# Patient Record
Sex: Female | Born: 1975
Health system: Southern US, Community
[De-identification: ages and names within clinical notes are randomized; demographics above are authoritative.]

## PROBLEM LIST (undated history)

## (undated) DIAGNOSIS — O139 Gestational [pregnancy-induced] hypertension without significant proteinuria, unspecified trimester: Secondary | ICD-10-CM

## (undated) DIAGNOSIS — L219 Seborrheic dermatitis, unspecified: Secondary | ICD-10-CM

## (undated) DIAGNOSIS — D649 Anemia, unspecified: Secondary | ICD-10-CM

---

## 1999-07-29 ENCOUNTER — Emergency Department (HOSPITAL_COMMUNITY): Admission: EM | Admit: 1999-07-29 | Discharge: 1999-07-29 | Payer: Self-pay | Admitting: Emergency Medicine

## 1999-11-13 ENCOUNTER — Other Ambulatory Visit: Admission: RE | Admit: 1999-11-13 | Discharge: 1999-11-13 | Payer: Self-pay | Admitting: Family Medicine

## 2000-03-20 ENCOUNTER — Encounter: Payer: Self-pay | Admitting: Emergency Medicine

## 2000-03-20 ENCOUNTER — Emergency Department (HOSPITAL_COMMUNITY): Admission: EM | Admit: 2000-03-20 | Discharge: 2000-03-20 | Payer: Self-pay | Admitting: Emergency Medicine

## 2001-04-19 ENCOUNTER — Other Ambulatory Visit: Admission: RE | Admit: 2001-04-19 | Discharge: 2001-04-19 | Payer: Self-pay | Admitting: Family Medicine

## 2002-01-26 ENCOUNTER — Inpatient Hospital Stay (HOSPITAL_COMMUNITY): Admission: AD | Admit: 2002-01-26 | Discharge: 2002-01-26 | Payer: Self-pay | Admitting: Obstetrics and Gynecology

## 2002-04-09 ENCOUNTER — Inpatient Hospital Stay (HOSPITAL_COMMUNITY): Admission: AD | Admit: 2002-04-09 | Discharge: 2002-04-09 | Payer: Self-pay | Admitting: Obstetrics and Gynecology

## 2003-01-30 ENCOUNTER — Emergency Department (HOSPITAL_COMMUNITY): Admission: EM | Admit: 2003-01-30 | Discharge: 2003-01-30 | Payer: Self-pay | Admitting: Emergency Medicine

## 2004-07-18 ENCOUNTER — Emergency Department (HOSPITAL_COMMUNITY): Admission: EM | Admit: 2004-07-18 | Discharge: 2004-07-18 | Payer: Self-pay | Admitting: Emergency Medicine

## 2004-10-28 ENCOUNTER — Emergency Department (HOSPITAL_COMMUNITY): Admission: EM | Admit: 2004-10-28 | Discharge: 2004-10-28 | Payer: Self-pay | Admitting: Emergency Medicine

## 2004-11-05 ENCOUNTER — Other Ambulatory Visit: Admission: RE | Admit: 2004-11-05 | Discharge: 2004-11-05 | Payer: Self-pay | Admitting: Family Medicine

## 2005-02-13 ENCOUNTER — Emergency Department (HOSPITAL_COMMUNITY): Admission: EM | Admit: 2005-02-13 | Discharge: 2005-02-14 | Payer: Self-pay | Admitting: Emergency Medicine

## 2005-03-20 ENCOUNTER — Emergency Department (HOSPITAL_COMMUNITY): Admission: EM | Admit: 2005-03-20 | Discharge: 2005-03-20 | Payer: Self-pay | Admitting: Emergency Medicine

## 2006-01-04 ENCOUNTER — Emergency Department (HOSPITAL_COMMUNITY): Admission: EM | Admit: 2006-01-04 | Discharge: 2006-01-04 | Payer: Self-pay | Admitting: Emergency Medicine

## 2007-01-12 ENCOUNTER — Other Ambulatory Visit: Admission: RE | Admit: 2007-01-12 | Discharge: 2007-01-12 | Payer: Self-pay | Admitting: Family Medicine

## 2007-01-14 ENCOUNTER — Emergency Department (HOSPITAL_COMMUNITY): Admission: EM | Admit: 2007-01-14 | Discharge: 2007-01-14 | Payer: Self-pay | Admitting: Family Medicine

## 2009-01-21 ENCOUNTER — Emergency Department (HOSPITAL_COMMUNITY): Admission: EM | Admit: 2009-01-21 | Discharge: 2009-01-21 | Payer: Self-pay | Admitting: Emergency Medicine

## 2009-02-18 ENCOUNTER — Other Ambulatory Visit: Admission: RE | Admit: 2009-02-18 | Discharge: 2009-02-18 | Payer: Self-pay | Admitting: Family Medicine

## 2011-10-29 ENCOUNTER — Emergency Department (HOSPITAL_BASED_OUTPATIENT_CLINIC_OR_DEPARTMENT_OTHER)
Admission: EM | Admit: 2011-10-29 | Discharge: 2011-10-29 | Disposition: A | Discharge status: Home / Self Care | Payer: Managed Care, Other (non HMO) | Attending: Emergency Medicine | Admitting: Emergency Medicine

## 2011-10-29 DIAGNOSIS — R300 Dysuria: Secondary | ICD-10-CM | POA: Insufficient documentation

## 2011-10-29 DIAGNOSIS — N390 Urinary tract infection, site not specified: Secondary | ICD-10-CM | POA: Insufficient documentation

## 2011-10-29 DIAGNOSIS — R350 Frequency of micturition: Secondary | ICD-10-CM | POA: Insufficient documentation

## 2011-10-29 DIAGNOSIS — F17200 Nicotine dependence, unspecified, uncomplicated: Secondary | ICD-10-CM | POA: Insufficient documentation

## 2011-10-29 LAB — URINE MICROSCOPIC-ADD ON

## 2011-10-29 LAB — URINALYSIS, ROUTINE W REFLEX MICROSCOPIC
Glucose, UA: NEGATIVE mg/dL
Protein, ur: 30 mg/dL — AB
Specific Gravity, Urine: 1.017 (ref 1.005–1.030)
pH: 6 (ref 5.0–8.0)

## 2011-10-29 LAB — PREGNANCY, URINE: Preg Test, Ur: NEGATIVE

## 2011-10-29 MED ORDER — PHENAZOPYRIDINE HCL 200 MG PO TABS: 200.0000 mg | ORAL_TABLET | Freq: Three times a day (TID) | ORAL | Status: AC

## 2011-10-29 MED ORDER — NITROFURANTOIN MONOHYD MACRO 100 MG PO CAPS: 100.0000 mg | ORAL_CAPSULE | Freq: Two times a day (BID) | ORAL | Status: AC

## 2011-10-29 MED ORDER — NITROFURANTOIN MONOHYD MACRO 100 MG PO CAPS
100.0000 mg | ORAL_CAPSULE | Freq: Once | ORAL | Status: AC
  Administered 2011-10-29: 100 mg via ORAL
  Filled 2011-10-29: qty 1

## 2011-10-29 NOTE — ED Provider Notes (Signed)
History     CSN: [id number]  Arrival date & time 10/29/11  1001   First MD Initiated Contact with Patient 10/29/11 1017      Chief Complaint  Patient presents with  . Urinary Frequency    (Consider location/radiation/quality/duration/timing/severity/associated sxs/prior treatment) HPI Comments: Complains of dysuria at the end of her urinary stream, urinary frequency, urinary urgency. States she's unable to sleep last night secondary to her urinary frequency. Denies fever, abdominal pain, nausea, vomiting.  Denies vaginal sx  Patient is a 35 y.o. female presenting with frequency. The history is provided by the patient. No language interpreter was used.  Urinary Frequency This is a new problem. The current episode started more than 2 days ago (1 week ago). The problem occurs constantly. The problem has been gradually worsening. Pertinent negatives include no chest pain, no abdominal pain, no headaches and no shortness of breath. The symptoms are aggravated by nothing. The symptoms are relieved by nothing. She has tried nothing for the symptoms.    History reviewed. No pertinent past medical history.  History reviewed. No pertinent past surgical history.  No family history on file.  History  Substance Use Topics  . Smoking status: Current Everyday Smoker -- 0.5 packs/day  . Smokeless tobacco: Never Used  . Alcohol Use: Yes     occasional    OB History    Grav Para Term Preterm Abortions TAB SAB Ect Mult Living                  Review of Systems  Constitutional: Negative for fever, activity change, appetite change and fatigue.  HENT: Negative for congestion, sore throat, rhinorrhea, neck pain and neck stiffness.   Respiratory: Negative for cough and shortness of breath.   Cardiovascular: Negative for chest pain and palpitations.  Gastrointestinal: Negative for nausea, vomiting and abdominal pain.  Genitourinary: Positive for dysuria, urgency and frequency. Negative for  hematuria, flank pain, vaginal bleeding and vaginal discharge.  Musculoskeletal: Negative for myalgias, back pain and arthralgias.  Neurological: Negative for dizziness, weakness, light-headedness, numbness and headaches.  All other systems reviewed and are negative.    Allergies  Review of patient's allergies indicates no known allergies.  Home Medications   Current Outpatient Rx  Name Route Sig Dispense Refill  . NITROFURANTOIN MONOHYD MACRO 100 MG PO CAPS Oral Take 1 capsule (100 mg total) by mouth 2 (two) times daily. 10 capsule 0  . PHENAZOPYRIDINE HCL 200 MG PO TABS Oral Take 1 tablet (200 mg total) by mouth 3 (three) times daily. 6 tablet 0    BP 118/79  Pulse 84  Temp(Src) 98.2 F (36.8 C) (Oral)  Resp 16  Ht 5\' 3"  (1.6 m)  Wt 180 lb (81.647 kg)  BMI 31.89 kg/m2  SpO2 100%  LMP 10/08/2011  Physical Exam  Nursing note and vitals reviewed. Constitutional: She is oriented to person, place, and time. She appears well-developed and well-nourished. No distress.  HENT:  Head: Normocephalic and atraumatic.  Mouth/Throat: Oropharynx is clear and moist.  Eyes: Conjunctivae and EOM are normal. Pupils are equal, round, and reactive to light.  Neck: Normal range of motion. Neck supple.  Cardiovascular: Normal rate, regular rhythm and intact distal pulses.  Exam reveals no gallop and no friction rub.   No murmur heard. Pulmonary/Chest: Effort normal and breath sounds normal. No respiratory distress.  Abdominal: Soft. Bowel sounds are normal. There is no tenderness.  Musculoskeletal: Normal range of motion. She exhibits no tenderness.  Neurological: She  is alert and oriented to person, place, and time.  Skin: Skin is warm and dry. No rash noted.    ED Course  Procedures (including critical care time)  Labs Reviewed  URINALYSIS, ROUTINE W REFLEX MICROSCOPIC - Abnormal; Notable for the following:    APPearance TURBID (*)    Hgb urine dipstick MODERATE (*)    Protein, ur 30  (*)    Nitrite POSITIVE (*)    Leukocytes, UA LARGE (*)    All other components within normal limits  URINE MICROSCOPIC-ADD ON - Abnormal; Notable for the following:    Bacteria, UA MANY (*)    All other components within normal limits  PREGNANCY, URINE  URINE CULTURE   No results found.   1. UTI (lower urinary tract infection)       MDM  Lower urinary tract infection with no signs or symptoms to suggest pyelonephritis. She'll receive her first dose of Macrobid here in the emergency department will be discharged home with a prescription of the same. She'll also be provided a prescription for Pyridium. A urine culture was sent. She's provided clear signs and symptoms for which to return the emergency department including nausea, vomiting, fever, flank pain.        Dayton Bailiff, MD 10/29/11 1053

## 2011-10-29 NOTE — ED Notes (Signed)
MD at bedside. 

## 2011-10-29 NOTE — ED Notes (Signed)
Pt reports urinary frequency and pain with voiding.  Onset Saturday.

## 2011-10-29 NOTE — Discharge Instructions (Signed)

## 2011-10-31 LAB — URINE CULTURE: Culture  Setup Time: 201212281436

## 2011-11-01 NOTE — ED Notes (Signed)
+   urine Patient treated with Cipro-sensitive to same-chart appended per protocol MD. 

## 2013-12-30 DIAGNOSIS — K625 Hemorrhage of anus and rectum: Secondary | ICD-10-CM | POA: Insufficient documentation

## 2014-05-04 ENCOUNTER — Emergency Department (HOSPITAL_BASED_OUTPATIENT_CLINIC_OR_DEPARTMENT_OTHER)
Admission: EM | Admit: 2014-05-04 | Discharge: 2014-05-04 | Disposition: A | Discharge status: Home / Self Care | Payer: Managed Care, Other (non HMO) | Attending: Emergency Medicine | Admitting: Emergency Medicine

## 2014-05-04 ENCOUNTER — Encounter (HOSPITAL_BASED_OUTPATIENT_CLINIC_OR_DEPARTMENT_OTHER): Payer: Self-pay | Admitting: Emergency Medicine

## 2014-05-04 DIAGNOSIS — L738 Other specified follicular disorders: Secondary | ICD-10-CM | POA: Insufficient documentation

## 2014-05-04 DIAGNOSIS — L210 Seborrhea capitis: Secondary | ICD-10-CM

## 2014-05-04 DIAGNOSIS — Z87891 Personal history of nicotine dependence: Secondary | ICD-10-CM | POA: Insufficient documentation

## 2014-05-04 MED ORDER — KETOCONAZOLE 2 % EX SHAM: 1.0000 "application " | MEDICATED_SHAMPOO | CUTANEOUS | Status: DC

## 2014-05-04 MED ORDER — DERMA-SMOOTHE/FS BODY 0.01 % EX OIL: 2.0000 [oz_av] | TOPICAL_OIL | CUTANEOUS | Status: DC

## 2014-05-04 NOTE — ED Notes (Signed)
MD at bedside. 

## 2014-05-04 NOTE — Discharge Instructions (Signed)
Seborrheic Dermatitis °Seborrheic dermatitis involves pink or red skin with greasy, flaky scales. This is often found on the scalp, eyebrows, nose, bearded area, and on or behind the ears. It can also occur on the central chest. It often occurs where there are more oil (sebaceous) glands. This condition is also known as dandruff. When this condition affects a baby's scalp, it is called cradle cap. It may come and go for no known reason. It can occur at any time of life from infancy to old age. °CAUSES  °The cause is unknown. It is not the result of too little moisture or too much oil. In some people, seborrheic dermatitis flare-ups seem to be triggered by stress. It also commonly occurs in people with certain diseases such as Parkinson's disease or HIV/AIDS. °SYMPTOMS  °· Thick scales on the scalp. °· Redness on the face or in the armpits. °· The skin may seem oily or dry, but moisturizers do not help. °· In infants, seborrheic dermatitis appears as scaly redness that does not seem to bother the baby. In some babies, it affects only the scalp. In others, it also affects the neck creases, armpits, groin, or behind the ears. °· In adults and adolescents, seborrheic dermatitis may affect only the scalp. It may look patchy or spread out, with areas of redness and flaking. Other areas commonly affected include: °¨ Eyebrows. °¨ Eyelids. °¨ Forehead. °¨ Skin behind the ears. °¨ Outer ears. °¨ Chest. °¨ Armpits. °¨ Nose creases. °¨ Skin creases under the breasts. °¨ Skin between the buttocks. °¨ Groin. °· Some adults and adolescents feel itching or burning in the affected areas. °DIAGNOSIS  °Your caregiver can usually tell what the problem is by doing a physical exam. °TREATMENT  °· Cortisone (steroid) ointments, creams, and lotions can help decrease inflammation. °· Babies can be treated with baby oil to soften the scales, then they may be washed with baby shampoo. If this does not help, a prescription topical steroid  medicine may work. °· Adults can use medicated shampoos. °· Your caregiver may prescribe corticosteroid cream and shampoo containing an antifungal or yeast medicine (ketoconazole). Hydrocortisone or anti-yeast cream can be rubbed directly onto seborrheic dermatitis patches. Yeast does not cause seborrheic dermatitis, but it seems to add to the problem. °In infants, seborrheic dermatitis is often worst during the first year of life. It tends to disappear on its own as the child grows. However, it may return during the teenage years. In adults and adolescents, seborrheic dermatitis tends to be a Devlin-lasting condition that comes and goes over many years. °HOME CARE INSTRUCTIONS  °· Use prescribed medicines as directed. °· In infants, do not aggressively remove the scales or flakes on the scalp with a comb or by other means. This may lead to hair loss. °SEEK MEDICAL CARE IF:  °· The problem does not improve from the medicated shampoos, lotions, or other medicines given by your caregiver. °· You have any other questions or concerns. °Document Released: 10/18/2005 Document Revised: 04/18/2012 Document Reviewed: 03/09/2010 °[employer]® Patient Information ©2015 [employer]. This information is not intended to replace advice given to you by your health care provider. Make sure you discuss any questions you have with your health care provider. ° °

## 2014-05-04 NOTE — ED Notes (Signed)
Patient here with scalp itching and has noticed increased hair loss since Thursday. Has some scalp dermatitis and uses shampoo for same but this is different.  No burning to scalp, denies new hair products, no pain

## 2014-05-04 NOTE — ED Provider Notes (Signed)
CSN: [id number]     Arrival date & time 05/04/14  [id number] History   First MD Initiated Contact with Patient 05/04/14 [phone]     Chief Complaint  Patient presents with  . Hair/Scalp Problem      HPI Patient here with scalp itching and has noticed increased hair loss since Thursday. Has some scalp dermatitis and uses shampoo for same but this is different. No burning to scalp, denies new hair products, no pain  History reviewed. No pertinent past medical history. History reviewed. No pertinent past surgical history. No family history on file. History  Substance Use Topics  . Smoking status: Former Smoker -- 0.50 packs/day  . Smokeless tobacco: Never Used  . Alcohol Use: Yes     Comment: occasional   OB History   Grav Para Term Preterm Abortions TAB SAB Ect Mult Living                 Review of Systems  All other systems reviewed and are negative.     Allergies  Review of patient's allergies indicates no known allergies.  Home Medications   Prior to Admission medications   Medication Sig Start Date End Date Taking? Authorizing Provider  Fluocinolone Acetonide (DERMA-SMOOTHE/FS BODY) 0.01 % OIL Apply 2 oz topically 1 day or 1 dose. 05/04/14   Nelia Shiobert L Darriel Sinquefield, MD  ketoconazole (NIZORAL) 2 % shampoo Apply 1 application topically 2 (two) times a week. 05/04/14   Nelia Shiobert L Crystel Demarco, MD   BP 136/74  Pulse 96  Temp(Src) 98 F (36.7 C) (Oral)  Resp 20  Ht 5\' 3"  (1.6 m)  Wt 197 lb (89.359 kg)  BMI 34.91 kg/m2  SpO2 100%  LMP 04/17/2014 Physical Exam  Nursing note and vitals reviewed. Constitutional: She is oriented to person, place, and time. She appears well-developed and well-nourished. No distress.  HENT:  Head: Normocephalic and atraumatic.  Eyes: Pupils are equal, round, and reactive to light.  Neck: Normal range of motion.  Cardiovascular: Normal rate and intact distal pulses.   Pulmonary/Chest: No respiratory distress.  Abdominal: Normal appearance. She exhibits no  distension.  Musculoskeletal: Normal range of motion.  Neurological: She is alert and oriented to person, place, and time. No cranial nerve deficit.  Skin: Skin is warm and dry. Rash noted.     Psychiatric: She has a normal mood and affect. Her behavior is normal.    ED Course  Procedures (including critical care time) Labs Review Labs Reviewed - No data to display  Imaging Review No results found.    MDM   Final diagnoses:  Seborrhea capitis        Nelia Shiobert L Kennadi Albany, MD 05/04/14 [phone]

## 2014-10-02 ENCOUNTER — Encounter: Payer: Self-pay | Admitting: Podiatry

## 2014-10-02 ENCOUNTER — Ambulatory Visit (INDEPENDENT_AMBULATORY_CARE_PROVIDER_SITE_OTHER): Payer: Managed Care, Other (non HMO) | Admitting: Podiatry

## 2014-10-02 VITALS — BP 146/84 | HR 60 | Resp 16

## 2014-10-02 DIAGNOSIS — B351 Tinea unguium: Secondary | ICD-10-CM

## 2014-10-02 MED ORDER — TERBINAFINE HCL 250 MG PO TABS: 250.0000 mg | ORAL_TABLET | Freq: Every day | ORAL | Status: DC

## 2014-10-02 NOTE — Patient Instructions (Signed)

## 2014-10-02 NOTE — Progress Notes (Signed)
   Subjective:    Patient ID: Miranda Shah, female    DOB: 11-27-1975, 38 y.o.   MRN: [id number]  HPI Comments: "I have a bad toenail"  Patient c/o tender 1st toenail right foot for several months. The 4th and 5th nails are now starting to discolor. The skin plantarly is peeling and scaly. PCP said she could try oral meds but didn't recommend due to side effects. She has tried soaking, listerine-no help.     Review of Systems  Skin:       Change in nails  All other systems reviewed and are negative.      Objective:   Physical Exam        Assessment & Plan:

## 2014-10-02 NOTE — Progress Notes (Signed)
Subjective:     Patient ID: Miranda Shah, female   DOB: [birth year], 38 y.o.   MRN: [id number]  HPI patient presents stating my big toenail has been thick and yellow for several years and now my fourth and fifth nails have started to do this and also the skin on my right foot. I have no trouble with the left foot   Review of Systems  All other systems reviewed and are negative.      Objective:   Physical Exam  Constitutional: She is oriented to person, place, and time.  Cardiovascular: Intact distal pulses.   Musculoskeletal: Normal range of motion.  Neurological: She is oriented to person, place, and time.  Skin: Skin is warm.  Nursing note and vitals reviewed.  neurovascular status found to be intact with muscle strength adequate in range of motion within normal limits. Patient's noted to have good digital perfusion is well oriented 3 and is noted to have thick crumbled discolored nailbeds hallux right fourth and fifth nail bed right with odor and plantar skin maceration     Assessment:     Probable mycotic infection right foot    Plan:     Reviewed condition and at this time I did do a biopsy of the nailbed to confirm fungus. We will start her on topical formulas 3 and probable Lamisil oral depending on results. Also sent for liver function study and will reappoint in 4 months earlier if any issues should occur

## 2014-10-21 ENCOUNTER — Encounter: Payer: Self-pay | Admitting: Podiatry

## 2015-02-05 ENCOUNTER — Ambulatory Visit: Payer: Managed Care, Other (non HMO) | Admitting: Podiatry

## 2016-02-26 LAB — HM PAP SMEAR

## 2018-03-23 LAB — OB RESULTS CONSOLE HEPATITIS B SURFACE ANTIGEN: HEP B S AG: NEGATIVE

## 2018-03-23 LAB — OB RESULTS CONSOLE HIV ANTIBODY (ROUTINE TESTING): HIV: NONREACTIVE

## 2018-03-23 LAB — OB RESULTS CONSOLE RUBELLA ANTIBODY, IGM: Rubella: IMMUNE

## 2018-04-04 DIAGNOSIS — O09529 Supervision of elderly multigravida, unspecified trimester: Secondary | ICD-10-CM | POA: Insufficient documentation

## 2018-04-05 DIAGNOSIS — D72829 Elevated white blood cell count, unspecified: Secondary | ICD-10-CM | POA: Insufficient documentation

## 2018-05-08 DIAGNOSIS — D649 Anemia, unspecified: Secondary | ICD-10-CM | POA: Insufficient documentation

## 2018-08-02 ENCOUNTER — Encounter (HOSPITAL_COMMUNITY): Payer: Self-pay | Admitting: *Deleted

## 2018-08-03 ENCOUNTER — Encounter (HOSPITAL_COMMUNITY): Payer: Self-pay

## 2018-08-03 ENCOUNTER — Ambulatory Visit (HOSPITAL_COMMUNITY)
Admission: RE | Admit: 2018-08-03 | Discharge: 2018-08-03 | Disposition: A | Discharge status: Home / Self Care | Payer: 59 | Source: Ambulatory Visit | Attending: Obstetrics and Gynecology | Admitting: Obstetrics and Gynecology

## 2018-08-03 DIAGNOSIS — Z3A30 30 weeks gestation of pregnancy: Secondary | ICD-10-CM | POA: Diagnosis not present

## 2018-08-03 DIAGNOSIS — O99213 Obesity complicating pregnancy, third trimester: Secondary | ICD-10-CM | POA: Insufficient documentation

## 2018-08-03 DIAGNOSIS — Z6837 Body mass index (BMI) 37.0-37.9, adult: Secondary | ICD-10-CM | POA: Insufficient documentation

## 2018-08-03 DIAGNOSIS — R748 Abnormal levels of other serum enzymes: Secondary | ICD-10-CM | POA: Diagnosis present

## 2018-08-03 DIAGNOSIS — O26613 Liver and biliary tract disorders in pregnancy, third trimester: Secondary | ICD-10-CM | POA: Insufficient documentation

## 2018-08-03 DIAGNOSIS — E6609 Other obesity due to excess calories: Secondary | ICD-10-CM | POA: Insufficient documentation

## 2018-08-03 DIAGNOSIS — O09523 Supervision of elderly multigravida, third trimester: Secondary | ICD-10-CM

## 2018-08-03 DIAGNOSIS — O26893 Other specified pregnancy related conditions, third trimester: Secondary | ICD-10-CM | POA: Diagnosis not present

## 2018-08-03 DIAGNOSIS — Z6836 Body mass index (BMI) 36.0-36.9, adult: Secondary | ICD-10-CM | POA: Insufficient documentation

## 2018-08-03 HISTORY — DX: Anemia, unspecified: D64.9

## 2018-08-03 HISTORY — DX: Seborrheic dermatitis, unspecified: L21.9

## 2018-08-03 NOTE — Consult Note (Signed)
Consultation:   Miranda Shah is a 42 yo African American female, G 2 P 0010  LMP unsure EDC 10/07/18 now  @ 30 5/7 weeks seen in consultation as requested secondary to:  1) Elevated LFT's - 009/09/19 - AST/ALT - 99/236; 07/27/09 - AST/ALT - 160/272    PREVIOUS OBSTETRICAL HISTORY:  1) 2003 - First trimester ETOP due to medication exposure without complications     PREVIOUS GYN HISTORY:   Abnormal PAP - neg   GC - neg   Chlamydia - neg    Syphilis - neg   CAT - 12 x 28-30 x 5   Contraception - none    PREVIOUS MEDICAL HISTORY:  DM - neg   HTN - neg   Asthma - neg   Thyroid - neg   Rheumatic Fever - neg    Heart - neg   Lung - neg   Liver - neg   Kidney - neg   Epilepsy - neg    TB - neg   Herpes - neg   UTI - neg   2015 - Seborrheic dermatitis     PREVIOUS SURGICAL HISTORY:  None    MEDICATIONS:  Prenatal Vitamins, Iron QD       ALLERGIES/REACTIONS:  None      HABITS:  Smoking - neg   Drinking - social but not during pregnancy   Drugs - neg    PSYCHOSOCIAL:  Married     PROFESSION:  Designer, industrial/product Aid    FAMILY HISTORY:  DM - M, F   HTN - F   Twins - S   Stillborns -neg    Birth Defects - neg   Mental Retardation - neg   Blood Dyscrasias - neg   Anesthesia Complications - neg    Genetic - neg          ELEVATED LFT'S:  General counseling was then performed regarding Hepatitis.  Various etiologies of hepatitis/elevated LFT's were discussed as well as management including infectious, medication/toxin exposure and autoimmune conditions.  Symptomatic treatment would be indicated.  Patient is asymptomatic.  She denies signs or symptoms of pre-eclampsia, heavy drinking, toxin exposure, auto-immune/rheumatologic disease, family h/o hepatic disease or cirrhosis or pruritus  . IMPRESSIONS:  1) Unexplained elevated LFT's 2) AMA - not discussed 3) Obesity - not discussed    RECOMMENDATIONS:  1) Labs to be drawn by OB: serum bile acids, ANA,  anti-smooth muscle antibody, anti-liver-kidney microsomal -1 antibody, anti-mitochondrial antibody, Immunoglobulin G.  If these are negative, obtain anti-liver cytosol antibody-1, anti-soluble liver antigen/liver pancreas antibody, atypical perinuclear antineutrophil cytoplasmic antibodies 2) U/S of liver and gall bladder and pancreas 3) Serial U/S every 4 weeks for fetal growth 4) Weekly BPP beginning @ 32 weeks             40 minutes spent in face-to-face consultation with greater than 50% of the time spent in counseling.    Thank you for utilizing our ultrasound and consultative services.  If I may be of any further service, please do not hesitate to contact me.  Sincerely,   Patsi Sears, MD Maternal-Fetal Medicine   Copy of report sent to practitioner/clinic.

## 2018-08-04 ENCOUNTER — Other Ambulatory Visit: Payer: Self-pay

## 2018-08-07 ENCOUNTER — Other Ambulatory Visit: Payer: Self-pay | Admitting: Obstetrics and Gynecology

## 2018-08-07 DIAGNOSIS — R945 Abnormal results of liver function studies: Secondary | ICD-10-CM

## 2018-08-07 DIAGNOSIS — R7989 Other specified abnormal findings of blood chemistry: Secondary | ICD-10-CM

## 2018-08-08 ENCOUNTER — Other Ambulatory Visit (HOSPITAL_COMMUNITY): Payer: Self-pay | Admitting: Maternal and Fetal Medicine

## 2018-08-08 ENCOUNTER — Ambulatory Visit (HOSPITAL_BASED_OUTPATIENT_CLINIC_OR_DEPARTMENT_OTHER)
Admission: RE | Admit: 2018-08-08 | Discharge: 2018-08-08 | Disposition: A | Discharge status: Home / Self Care | Payer: 59 | Source: Ambulatory Visit | Attending: Obstetrics and Gynecology | Admitting: Obstetrics and Gynecology

## 2018-08-08 ENCOUNTER — Encounter (HOSPITAL_COMMUNITY): Payer: Self-pay

## 2018-08-08 ENCOUNTER — Other Ambulatory Visit: Payer: Self-pay | Admitting: Obstetrics and Gynecology

## 2018-08-08 ENCOUNTER — Other Ambulatory Visit (HOSPITAL_COMMUNITY): Payer: Self-pay | Admitting: *Deleted

## 2018-08-08 ENCOUNTER — Inpatient Hospital Stay (HOSPITAL_COMMUNITY)
Admission: AD | Admit: 2018-08-08 | Discharge: 2018-08-08 | Disposition: A | Discharge status: Home / Self Care | Payer: 59 | Source: Ambulatory Visit | Attending: Obstetrics and Gynecology | Admitting: Obstetrics and Gynecology

## 2018-08-08 DIAGNOSIS — E6609 Other obesity due to excess calories: Secondary | ICD-10-CM | POA: Diagnosis present

## 2018-08-08 DIAGNOSIS — R945 Abnormal results of liver function studies: Principal | ICD-10-CM

## 2018-08-08 DIAGNOSIS — O2693 Pregnancy related conditions, unspecified, third trimester: Secondary | ICD-10-CM | POA: Insufficient documentation

## 2018-08-08 DIAGNOSIS — Z3A31 31 weeks gestation of pregnancy: Secondary | ICD-10-CM | POA: Insufficient documentation

## 2018-08-08 DIAGNOSIS — R7989 Other specified abnormal findings of blood chemistry: Secondary | ICD-10-CM | POA: Insufficient documentation

## 2018-08-08 DIAGNOSIS — R030 Elevated blood-pressure reading, without diagnosis of hypertension: Secondary | ICD-10-CM | POA: Diagnosis present

## 2018-08-08 DIAGNOSIS — O99213 Obesity complicating pregnancy, third trimester: Secondary | ICD-10-CM | POA: Diagnosis present

## 2018-08-08 DIAGNOSIS — O09523 Supervision of elderly multigravida, third trimester: Secondary | ICD-10-CM

## 2018-08-08 DIAGNOSIS — O09529 Supervision of elderly multigravida, unspecified trimester: Secondary | ICD-10-CM | POA: Diagnosis present

## 2018-08-08 DIAGNOSIS — O133 Gestational [pregnancy-induced] hypertension without significant proteinuria, third trimester: Secondary | ICD-10-CM | POA: Insufficient documentation

## 2018-08-08 DIAGNOSIS — Z6836 Body mass index (BMI) 36.0-36.9, adult: Secondary | ICD-10-CM | POA: Diagnosis present

## 2018-08-08 DIAGNOSIS — O139 Gestational [pregnancy-induced] hypertension without significant proteinuria, unspecified trimester: Secondary | ICD-10-CM | POA: Diagnosis not present

## 2018-08-08 DIAGNOSIS — O26613 Liver and biliary tract disorders in pregnancy, third trimester: Secondary | ICD-10-CM | POA: Diagnosis not present

## 2018-08-08 DIAGNOSIS — Z362 Encounter for other antenatal screening follow-up: Secondary | ICD-10-CM

## 2018-08-08 LAB — URINALYSIS, ROUTINE W REFLEX MICROSCOPIC
Bilirubin Urine: NEGATIVE
GLUCOSE, UA: NEGATIVE mg/dL
Hgb urine dipstick: NEGATIVE
KETONES UR: NEGATIVE mg/dL
LEUKOCYTES UA: NEGATIVE
Nitrite: NEGATIVE
PROTEIN: NEGATIVE mg/dL
Specific Gravity, Urine: 1.003 — ABNORMAL LOW (ref 1.005–1.030)
pH: 7 (ref 5.0–8.0)

## 2018-08-08 LAB — PROTEIN / CREATININE RATIO, URINE: Creatinine, Urine: 31 mg/dL

## 2018-08-08 LAB — COMPREHENSIVE METABOLIC PANEL
ALK PHOS: 122 U/L (ref 38–126)
ALT: 248 U/L — ABNORMAL HIGH (ref 0–44)
ANION GAP: 9 (ref 5–15)
AST: 143 U/L — ABNORMAL HIGH (ref 15–41)
Albumin: 3.1 g/dL — ABNORMAL LOW (ref 3.5–5.0)
BILIRUBIN TOTAL: 0.5 mg/dL (ref 0.3–1.2)
BUN: 10 mg/dL (ref 6–20)
CALCIUM: 9.3 mg/dL (ref 8.9–10.3)
CO2: 21 mmol/L — AB (ref 22–32)
CREATININE: 0.71 mg/dL (ref 0.44–1.00)
Chloride: 105 mmol/L (ref 98–111)
GFR calc non Af Amer: >60 mL/min (ref >60)
Glucose, Bld: 128 mg/dL — ABNORMAL HIGH (ref 70–99)
Potassium: 3.9 mmol/L (ref 3.5–5.1)
SODIUM: 135 mmol/L (ref 135–145)
Total Protein: 7.8 g/dL (ref 6.5–8.1)

## 2018-08-08 LAB — CBC
HEMATOCRIT: 31.6 % — AB (ref 36.0–46.0)
HEMOGLOBIN: 10.6 g/dL — AB (ref 12.0–15.0)
MCH: 28.9 pg (ref 26.0–34.0)
MCHC: 33.5 g/dL (ref 30.0–36.0)
MCV: 86.1 fL (ref 80.0–100.0)
Platelets: 317 10*3/uL (ref 150–400)
RBC: 3.67 MIL/uL — AB (ref 3.87–5.11)
RDW: 14.1 % (ref 11.5–15.5)
WBC: 11.1 10*3/uL — ABNORMAL HIGH (ref 4.0–10.5)
nRBC: 0 % (ref 0.0–0.2)

## 2018-08-08 NOTE — Discharge Instructions (Signed)
Hypertension During Pregnancy °Hypertension, commonly called high blood pressure, is when the force of blood pumping through your arteries is too strong. Arteries are blood vessels that carry blood from the heart throughout the body. Hypertension during pregnancy can cause problems for you and your baby. Your baby may be born early (prematurely) or may not weigh as much as he or she should at birth. Very bad cases of hypertension during pregnancy can be life-threatening. °Different types of hypertension can occur during pregnancy. These include: °· Chronic hypertension. This happens when: °? You have hypertension before pregnancy and it continues during pregnancy. °? You develop hypertension before you are [redacted] weeks pregnant, and it continues during pregnancy. °· Gestational hypertension. This is hypertension that develops after the 20th week of pregnancy. °· Preeclampsia, also called toxemia of pregnancy. This is a very serious type of hypertension that develops only during pregnancy. It affects the whole body, and it can be very dangerous for you and your baby. ° °Gestational hypertension and preeclampsia usually go away within 6 weeks after your baby is born. Women who have hypertension during pregnancy have a greater chance of developing hypertension later in life or during future pregnancies. °What are the causes? °The exact cause of hypertension is not known. °What increases the risk? °There are certain factors that make it more likely for you to develop hypertension during pregnancy. These include: °· Having hypertension during a previous pregnancy or prior to pregnancy. °· Being overweight. °· Being older than age 40. °· Being pregnant for the first time or being pregnant with more than one baby. °· Becoming pregnant using fertilization methods such as IVF (in vitro fertilization). °· Having diabetes, kidney problems, or systemic lupus erythematosus. °· Having a family history of hypertension. ° °What are the  signs or symptoms? °Chronic hypertension and gestational hypertension rarely cause symptoms. Preeclampsia causes symptoms, which may include: °· Increased protein in your urine. Your health care provider will check for this at every visit before you give birth (prenatal visit). °· Severe headaches. °· Sudden weight gain. °· Swelling of the hands, face, legs, and feet. °· Nausea and vomiting. °· Vision problems, such as blurred or double vision. °· Numbness in the face, arms, legs, and feet. °· Dizziness. °· Slurred speech. °· Sensitivity to bright lights. °· Abdominal pain. °· Convulsions. ° °How is this diagnosed? °You may be diagnosed with hypertension during a routine prenatal exam. At each prenatal visit, you may: °· Have a urine test to check for high amounts of protein in your urine. °· Have your blood pressure checked. A blood pressure reading is recorded as two numbers, such as "120 over 80" (or 120/80). The first ("top") number is called the systolic pressure. It is a measure of the pressure in your arteries when your heart beats. The second ("bottom") number is called the diastolic pressure. It is a measure of the pressure in your arteries as your heart relaxes between beats. Blood pressure is measured in a unit called mm Hg. A normal blood pressure reading is: °? Systolic: below 120. °? Diastolic: below 80. ° °The type of hypertension that you are diagnosed with depends on your test results and when your symptoms developed. °· Chronic hypertension is usually diagnosed before 20 weeks of pregnancy. °· Gestational hypertension is usually diagnosed after 20 weeks of pregnancy. °· Hypertension with high amounts of protein in the urine is diagnosed as preeclampsia. °· Blood pressure measurements that stay above 160 systolic, or above 110 diastolic, are   signs of severe preeclampsia. ° °How is this treated? °Treatment for hypertension during pregnancy varies depending on the type of hypertension you have and how  serious it is. °· If you take medicines called ACE inhibitors to treat chronic hypertension, you may need to switch medicines. ACE inhibitors should not be taken during pregnancy. °· If you have gestational hypertension, you may need to take blood pressure medicine. °· If you are at risk for preeclampsia, your health care provider may recommend that you take a low-dose aspirin every day to prevent high blood pressure during your pregnancy. °· If you have severe preeclampsia, you may need to be hospitalized so you and your baby can be monitored closely. You may also need to take medicine (magnesium sulfate) to prevent seizures and to lower blood pressure. This medicine may be given as an injection or through an IV tube. °· In some cases, if your condition gets worse, you may need to deliver your baby early. ° °Follow these instructions at home: °Eating and drinking °· Drink enough fluid to keep your urine clear or pale yellow. °· Eat a healthy diet that is low in salt (sodium). Do not add salt to your food. Check food labels to see how much sodium a food or beverage contains. °Lifestyle °· Do not use any products that contain nicotine or tobacco, such as cigarettes and e-cigarettes. If you need help quitting, ask your health care provider. °· Do not use alcohol. °· Avoid caffeine. °· Avoid stress as much as possible. Rest and get plenty of sleep. °General instructions °· Take over-the-counter and prescription medicines only as told by your health care provider. °· While lying down, lie on your left side. This keeps pressure off your baby. °· While sitting or lying down, raise (elevate) your feet. Try putting some pillows under your lower legs. °· Exercise regularly. Ask your health care provider what kinds of exercise are best for you. °· Keep all prenatal and follow-up visits as told by your health care provider. This is important. °Contact a health care provider if: °· You have symptoms that your health care  provider told you may require more treatment or monitoring, such as: °? Fever. °? Vomiting. °? Headache. °Get help right away if: °· You have severe abdominal pain or vomiting that does not get better with treatment. °· You suddenly develop swelling in your hands, ankles, or face. °· You gain 4 lbs (1.8 kg) or more in 1 week. °· You develop vaginal bleeding, or you have blood in your urine. °· You do not feel your baby moving as much as usual. °· You have blurred or double vision. °· You have muscle twitching or sudden tightening (spasms). °· You have shortness of breath. °· Your lips or fingernails turn blue. °This information is not intended to replace advice given to you by your health care provider. Make sure you discuss any questions you have with your health care provider. °Document Released: 07/06/2011 Document Revised: 05/07/2016 Document Reviewed: 04/02/2016 °Elsevier Interactive Patient Education © 2018 [employer] ° °Fetal Movement Counts °Patient Name: ________________________________________________ Patient Due Date: ____________________ °What is a fetal movement count? °A fetal movement count is the number of times that you feel your baby move during a certain amount of time. This may also be called a fetal kick count. A fetal movement count is recommended for every pregnant woman. You may be asked to start counting fetal movements as early as week 28 of your pregnancy. °Pay attention to   when your baby is most active. You may notice your baby's sleep and wake cycles. You may also notice things that make your baby move more. You should do a fetal movement count: °· When your baby is normally most active. °· At the same time each day. ° °A good time to count movements is while you are resting, after having something to eat and drink. °How do I count fetal movements? °1. Find a quiet, comfortable area. Sit, or lie down on your side. °2. Write down the date, the start time and stop time, and the number  of movements that you felt between those two times. Take this information with you to your health care visits. °3. For 2 hours, count kicks, flutters, swishes, rolls, and jabs. You should feel at least 10 movements during 2 hours. °4. You may stop counting after you have felt 10 movements. °5. If you do not feel 10 movements in 2 hours, have something to eat and drink. Then, keep resting and counting for 1 hour. If you feel at least 4 movements during that hour, you may stop counting. °Contact a health care provider if: °· You feel fewer than 4 movements in 2 hours. °· Your baby is not moving like he or she usually does. °Date: ____________ Start time: ____________ Stop time: ____________ Movements: ____________ °Date: ____________ Start time: ____________ Stop time: ____________ Movements: ____________ °Date: ____________ Start time: ____________ Stop time: ____________ Movements: ____________ °Date: ____________ Start time: ____________ Stop time: ____________ Movements: ____________ °Date: ____________ Start time: ____________ Stop time: ____________ Movements: ____________ °Date: ____________ Start time: ____________ Stop time: ____________ Movements: ____________ °Date: ____________ Start time: ____________ Stop time: ____________ Movements: ____________ °Date: ____________ Start time: ____________ Stop time: ____________ Movements: ____________ °Date: ____________ Start time: ____________ Stop time: ____________ Movements: ____________ °This information is not intended to replace advice given to you by your health care provider. Make sure you discuss any questions you have with your health care provider. °Document Released: 11/17/2006 Document Revised: 06/16/2016 Document Reviewed: 11/27/2015 °Elsevier Interactive Patient Education © 2018 [employer] ° °

## 2018-08-08 NOTE — MAU Note (Signed)
Reports elevated LFT  Elevated BP, today when she was in the office for NST BP was 158/110  No headache, no visual changed, no epigastric pain  +FM

## 2018-08-08 NOTE — MAU Note (Signed)
Pt is  G2P0 at 31.3 weeks sent from office for increased BP.Marland Kitchen  No HA or blurry vision, +3 reflexes, +3 beats clonus, no pain, good FM.

## 2018-08-08 NOTE — MAU Provider Note (Signed)
Chief Complaint  Patient presents with  . Hypertension     First Provider Initiated Contact with Patient 08/08/18 1038      S: Miranda Shah  is a 42 y.o. y.o. year old G3P0010 female at [redacted]w[redacted]d weeks gestation who presents to MAU from the office with elevated blood pressures. Denies history of hypertension prior to pregnancy. States she's being followed for elevated LFTs. Reports that today was the first day she had elevated BP, but that she was told they were starting to go up the last few visits. Is on no meds for HTN.  Denies headache, visual disturbance, or epigastric pain.  Denies contractions, LOF, or vaginal bleeding. Positive fetal movement.    O:   Today's Vitals   08/08/18 1011 08/08/18 1016 08/08/18 1031 08/08/18 1046 08/08/18 1101  BP: 138/76 (!) 150/77 (!) 143/80 (!) 144/84 133/76  Pulse: 110 99 94 95 87    General: NAD Heart: Regular rate Lungs: Normal rate and effort Abd: Soft, NT, Gravid, S=D Extremities: 2+ pitting bilateral Pedal edema Neuro: 2+ deep tendon reflexes, No clonus   NST:  Baseline: 140 bpm, Variability: Good {> 6 bpm), Accelerations: Reactive and Decelerations: Absent  Results for orders placed or performed during the hospital encounter of 08/08/18 (from the past 24 hour(s))  CBC     Status: Abnormal   Collection Time: 08/08/18  9:53 AM  Result Value Ref Range   WBC 11.1 (H) 4.0 - 10.5 K/uL   RBC 3.67 (L) 3.87 - 5.11 MIL/uL   Hemoglobin 10.6 (L) 12.0 - 15.0 g/dL   HCT 16.1 (L) 09.6 - 04.5 %   MCV 86.1 80.0 - 100.0 fL   MCH 28.9 26.0 - 34.0 pg   MCHC 33.5 30.0 - 36.0 g/dL   RDW 40.9 81.1 - 91.4 %   Platelets 317 150 - 400 K/uL   nRBC 0.0 0.0 - 0.2 %  Comprehensive metabolic panel     Status: Abnormal   Collection Time: 08/08/18  9:53 AM  Result Value Ref Range   Sodium 135 135 - 145 mmol/L   Potassium 3.9 3.5 - 5.1 mmol/L   Chloride 105 98 - 111 mmol/L   CO2 21 (L) 22 - 32 mmol/L   Glucose, Bld 128 (H) 70 - 99 mg/dL   BUN 10 6 - 20 mg/dL    Creatinine, Ser 7.82 0.44 - 1.00 mg/dL   Calcium 9.3 8.9 - 95.6 mg/dL   Total Protein 7.8 6.5 - 8.1 g/dL   Albumin 3.1 (L) 3.5 - 5.0 g/dL   AST 213 (H) 15 - 41 U/L   ALT 248 (H) 0 - 44 U/L   Alkaline Phosphatase 122 38 - 126 U/L   Total Bilirubin 0.5 0.3 - 1.2 mg/dL   GFR calc non Af Amer >60 >60 mL/min   GFR calc Af Amer >60 >60 mL/min   Anion gap 9 5 - 15  Urinalysis, Routine w reflex microscopic     Status: Abnormal   Collection Time: 08/08/18 10:10 AM  Result Value Ref Range   Color, Urine YELLOW YELLOW   APPearance HAZY (A) CLEAR   Specific Gravity, Urine 1.003 (L) 1.005 - 1.030   pH 7.0 5.0 - 8.0   Glucose, UA NEGATIVE NEGATIVE mg/dL   Hgb urine dipstick NEGATIVE NEGATIVE   Bilirubin Urine NEGATIVE NEGATIVE   Ketones, ur NEGATIVE NEGATIVE mg/dL   Protein, ur NEGATIVE NEGATIVE mg/dL   Nitrite NEGATIVE NEGATIVE   Leukocytes, UA NEGATIVE NEGATIVE  MDM: Reactive NST Cycle BPs, none severe range.  Platelets, serum creatinine, & urine PCR WNL LFTs remain elevated but comparable to last study (per report from Nigel Bridgeman CNM) C/w Dr. Su Hilt. Ok to discharge patient home. She will be called to schedule follow up appt tomorrow to plan management of care.   A: [redacted]w[redacted]d week IUP 1. Gestational hypertension, third trimester   2. [redacted] weeks gestation of pregnancy   3. Elevated liver function tests      P:  Discharge home in stable condition per consult with Osborn Coho, MD. Follow-up for blood pressure check & management in 1 days at your doctor's office sooner as needed if symptoms worsen. Return to maternity admissions as needed in emergencies  Judeth Horn, NP 08/08/2018 10:38 AM

## 2018-08-08 NOTE — Consult Note (Signed)
U/S images reviewed. Findings reviewed with patient.   No evidence of fetal compromise is found on BPP today.  No fetal abnormalities are seen.  Patient was observed in the MAU earlier today secondary to a BP of 158/110.  She reports at least one additional BP elevation in office.  This now raises the concern for her elevated LFT's possibly being representative of atypical severe pre-eclampsia BP - 135/81.  Patient denies headaches, visual disturbance, mid-epigastric or RUQ pain.  08/08/18 - AST/ALT - 143/248 ; H/H - 10.6/31.6 317K; UA - negative for protein; Protein/Creatinine - low (unable to calculate). General counseling regarding pre-eclampsia/PIH/Gestational Hypertension was performed.  A description of the criteria for severe pre-eclampsia was given along with its management at this gestational age.  The role of Magnesium sulfate in seizure prophylaxis was outlined.    Patients with mild pre-eclampsia/PIH/Gestational Hypertension should be delivered at 37 weeks based on the HYPITAT multicenter trial.  This trial showed that pre-eclamptic women benefited from early intervention, without incurring an increased risk of operative delivery or neonatal morbidity. The trial was not large enough to determine whether small differences in newborn outcomes or induction between 36 and 37 weeks might be statistically significant. A follow-up economic analysis of this trial concluded induction was also less costly overall than expectant management with monitoring.  MgSO4 if given, should be initiated as a 4-6 gram bolus over 20 (4 gram bolus) to 30 minutes (6 gram bolus) (a faster therapeutic level will be obtained if a 6 gram bolus is utilized) followed by 2-2  grams per hour continuous infusion. Therapeutic Magnesium levels are 5 to 8 mg percent.  If Magnesium levels are sub-therapeutic, a rebolus of 2 grams over 10 minutes will often correct the Magnesium level.  If levels are on the lower end of the therapeutic  range, increasing the hourly rate by  gram per hour often suffices.  Magnesium levels may be checked every 6 hours if necessary.    In order to acutely lower BP, Hydralazine IV or Labetalol IV may be used.  Hydralazine may be given as 5-10 mg IV increments every 15 - 20 minutes, if BP is not significantly reduced, 20 mg IV Labetalol should be used or alternatively started with.  IV Labetalol may be given as 20 mg then 40 mg then 80 mg every 10 -15 minutes (80 mg being the largest individual dose) up to a total IV dose of 300 mg.  If necessary a continuous IV Labetalol drip may be utilized.  Immediate release oral Nifedipine 10 mg PO followed by 20 mg every 20 minutes may also be utilized as a first line treatment to maintain Systolic BP < 160 and/or diastolic BP < 110.  Maximum daily dose is 180 mg.  If after 50 mg PO immediate release Nifedipine has been given and BP remains greater than 160 for systolic BP or 110 for diastolic, Labetalol or Hydralazine should be utilized.  Questions answered. 25 minutes spent face to face with patient. Recommendations: 1) Twice weekly A-P surveillance with BP check (once with MFM and once with OB) 2) Weekly PIH labs to be done by OB 3) Serial U/S every 4 weeks for fetal growth 4) To MAU for outpatient Celestone (first injection 12 mg today, second injection 12 mg tomorrow (24 hours after first injection)

## 2018-08-15 ENCOUNTER — Ambulatory Visit (HOSPITAL_BASED_OUTPATIENT_CLINIC_OR_DEPARTMENT_OTHER)
Admission: RE | Admit: 2018-08-15 | Discharge: 2018-08-15 | Disposition: A | Discharge status: Home / Self Care | Payer: 59 | Source: Ambulatory Visit | Attending: Obstetrics and Gynecology | Admitting: Obstetrics and Gynecology

## 2018-08-15 ENCOUNTER — Ambulatory Visit (INDEPENDENT_AMBULATORY_CARE_PROVIDER_SITE_OTHER): Payer: 59

## 2018-08-15 ENCOUNTER — Other Ambulatory Visit (HOSPITAL_COMMUNITY): Payer: Self-pay | Admitting: Maternal and Fetal Medicine

## 2018-08-15 ENCOUNTER — Encounter: Payer: Self-pay | Admitting: *Deleted

## 2018-08-15 ENCOUNTER — Encounter (HOSPITAL_COMMUNITY): Payer: Self-pay

## 2018-08-15 DIAGNOSIS — O139 Gestational [pregnancy-induced] hypertension without significant proteinuria, unspecified trimester: Secondary | ICD-10-CM

## 2018-08-15 DIAGNOSIS — O09523 Supervision of elderly multigravida, third trimester: Secondary | ICD-10-CM | POA: Insufficient documentation

## 2018-08-15 DIAGNOSIS — Z362 Encounter for other antenatal screening follow-up: Secondary | ICD-10-CM | POA: Diagnosis not present

## 2018-08-15 DIAGNOSIS — O133 Gestational [pregnancy-induced] hypertension without significant proteinuria, third trimester: Secondary | ICD-10-CM | POA: Diagnosis not present

## 2018-08-15 DIAGNOSIS — Z3A32 32 weeks gestation of pregnancy: Secondary | ICD-10-CM

## 2018-08-15 DIAGNOSIS — Z363 Encounter for antenatal screening for malformations: Secondary | ICD-10-CM | POA: Diagnosis not present

## 2018-08-15 DIAGNOSIS — Z0130 Encounter for examination of blood pressure without abnormal findings: Secondary | ICD-10-CM

## 2018-08-15 MED ORDER — BETAMETHASONE SOD PHOS & ACET 6 (3-3) MG/ML IJ SUSP
12.0000 mg | Freq: Once | INTRAMUSCULAR | Status: AC
  Administered 2018-08-15: 12 mg via INTRAMUSCULAR

## 2018-08-15 NOTE — Progress Notes (Signed)
I have reviewed the chart and agree with nursing staff's documentation of this patient's encounter.  Catalina Antigua, MD 08/15/2018 4:04 PM

## 2018-08-15 NOTE — Progress Notes (Signed)
Miranda Shah here for Betamethasone  Injection.  Injection administered without complication. Patient will return in 24 hours for next injection.  Pt advised to come tomorrow at 1400 for her second dose.  Pt stated understanding with no further questions.   Ralene Bathe, RN 08/15/2018  3:50 PM

## 2018-08-16 ENCOUNTER — Other Ambulatory Visit: Payer: Self-pay

## 2018-08-16 ENCOUNTER — Inpatient Hospital Stay (HOSPITAL_COMMUNITY)
Admission: AD | Admit: 2018-08-16 | Discharge: 2018-08-18 | DRG: 832 | Disposition: A | Discharge status: Home / Self Care | Payer: 59 | Attending: Obstetrics & Gynecology | Admitting: Obstetrics & Gynecology

## 2018-08-16 ENCOUNTER — Other Ambulatory Visit (HOSPITAL_COMMUNITY): Payer: Self-pay | Admitting: *Deleted

## 2018-08-16 ENCOUNTER — Other Ambulatory Visit: Payer: 59

## 2018-08-16 ENCOUNTER — Ambulatory Visit (INDEPENDENT_AMBULATORY_CARE_PROVIDER_SITE_OTHER): Payer: 59

## 2018-08-16 ENCOUNTER — Encounter (HOSPITAL_COMMUNITY): Payer: Self-pay

## 2018-08-16 DIAGNOSIS — O09219 Supervision of pregnancy with history of pre-term labor, unspecified trimester: Secondary | ICD-10-CM

## 2018-08-16 DIAGNOSIS — O09213 Supervision of pregnancy with history of pre-term labor, third trimester: Secondary | ICD-10-CM

## 2018-08-16 DIAGNOSIS — O10913 Unspecified pre-existing hypertension complicating pregnancy, third trimester: Secondary | ICD-10-CM

## 2018-08-16 DIAGNOSIS — R748 Abnormal levels of other serum enzymes: Secondary | ICD-10-CM | POA: Diagnosis not present

## 2018-08-16 DIAGNOSIS — Z3A32 32 weeks gestation of pregnancy: Secondary | ICD-10-CM | POA: Diagnosis not present

## 2018-08-16 DIAGNOSIS — O133 Gestational [pregnancy-induced] hypertension without significant proteinuria, third trimester: Secondary | ICD-10-CM | POA: Diagnosis present

## 2018-08-16 DIAGNOSIS — O26613 Liver and biliary tract disorders in pregnancy, third trimester: Secondary | ICD-10-CM | POA: Diagnosis present

## 2018-08-16 DIAGNOSIS — O368130 Decreased fetal movements, third trimester, not applicable or unspecified: Secondary | ICD-10-CM | POA: Diagnosis present

## 2018-08-16 DIAGNOSIS — O165 Unspecified maternal hypertension, complicating the puerperium: Secondary | ICD-10-CM

## 2018-08-16 DIAGNOSIS — K8020 Calculus of gallbladder without cholecystitis without obstruction: Secondary | ICD-10-CM | POA: Diagnosis present

## 2018-08-16 DIAGNOSIS — Z87891 Personal history of nicotine dependence: Secondary | ICD-10-CM | POA: Diagnosis not present

## 2018-08-16 LAB — CBC
HCT: 29.2 % — ABNORMAL LOW (ref 36.0–46.0)
Hemoglobin: 9.9 g/dL — ABNORMAL LOW (ref 12.0–15.0)
MCH: 29.2 pg (ref 26.0–34.0)
MCHC: 33.9 g/dL (ref 30.0–36.0)
MCV: 86.1 fL (ref 80.0–100.0)
PLATELETS: 318 10*3/uL (ref 150–400)
RBC: 3.39 MIL/uL — ABNORMAL LOW (ref 3.87–5.11)
RDW: 14 % (ref 11.5–15.5)
WBC: 17.2 10*3/uL — ABNORMAL HIGH (ref 4.0–10.5)
nRBC: 0 % (ref 0.0–0.2)

## 2018-08-16 LAB — COMPREHENSIVE METABOLIC PANEL
ALBUMIN: 3.1 g/dL — AB (ref 3.5–5.0)
ALT: 224 U/L — ABNORMAL HIGH (ref 0–44)
AST: 169 U/L — AB (ref 15–41)
Alkaline Phosphatase: 113 U/L (ref 38–126)
Anion gap: 10 (ref 5–15)
BUN: 13 mg/dL (ref 6–20)
CHLORIDE: 104 mmol/L (ref 98–111)
CO2: 21 mmol/L — ABNORMAL LOW (ref 22–32)
Calcium: 9.4 mg/dL (ref 8.9–10.3)
Creatinine, Ser: 0.87 mg/dL (ref 0.44–1.00)
GFR calc Af Amer: >60 mL/min (ref >60)
GFR calc non Af Amer: >60 mL/min (ref >60)
GLUCOSE: 167 mg/dL — AB (ref 70–99)
POTASSIUM: 4.3 mmol/L (ref 3.5–5.1)
Sodium: 135 mmol/L (ref 135–145)
Total Bilirubin: 0.1 mg/dL — ABNORMAL LOW (ref 0.3–1.2)
Total Protein: 7.4 g/dL (ref 6.5–8.1)

## 2018-08-16 LAB — TYPE AND SCREEN
ABO/RH(D): A POS
ANTIBODY SCREEN: NEGATIVE

## 2018-08-16 LAB — PROTEIN / CREATININE RATIO, URINE
CREATININE, URINE: 186 mg/dL
Protein Creatinine Ratio: 0.13 mg/mg{Cre} (ref 0.00–0.15)
Total Protein, Urine: 24 mg/dL

## 2018-08-16 MED ORDER — CALCIUM CARBONATE ANTACID 500 MG PO CHEW
2.0000 | CHEWABLE_TABLET | ORAL | Status: DC | PRN
  Administered 2018-08-16 – 2018-08-18 (×5): 400 mg via ORAL
  Filled 2018-08-16 (×5): qty 2

## 2018-08-16 MED ORDER — MAGNESIUM SULFATE 40 G IN LACTATED RINGERS - SIMPLE
2.0000 g/h | INTRAVENOUS | Status: DC
  Administered 2018-08-16: 2 g/h via INTRAVENOUS
  Filled 2018-08-16: qty 500

## 2018-08-16 MED ORDER — PRENATAL MULTIVITAMIN CH
1.0000 | ORAL_TABLET | Freq: Every day | ORAL | Status: DC
  Administered 2018-08-17 – 2018-08-18 (×2): 1 via ORAL
  Filled 2018-08-16 (×3): qty 1

## 2018-08-16 MED ORDER — MAGNESIUM SULFATE BOLUS VIA INFUSION
4.0000 g | Freq: Once | INTRAVENOUS | Status: AC
  Administered 2018-08-16: 4 g via INTRAVENOUS
  Filled 2018-08-16: qty 500

## 2018-08-16 MED ORDER — ACETAMINOPHEN 325 MG PO TABS: 650.0000 mg | ORAL_TABLET | ORAL | Status: DC | PRN

## 2018-08-16 MED ORDER — LABETALOL HCL 5 MG/ML IV SOLN: 40.0000 mg | INTRAVENOUS | Status: DC | PRN

## 2018-08-16 MED ORDER — LABETALOL HCL 5 MG/ML IV SOLN: 20.0000 mg | INTRAVENOUS | Status: DC | PRN

## 2018-08-16 MED ORDER — LACTATED RINGERS IV SOLN
INTRAVENOUS | Status: DC
  Administered 2018-08-16 – 2018-08-17 (×2): via INTRAVENOUS

## 2018-08-16 MED ORDER — DOCUSATE SODIUM 100 MG PO CAPS
100.0000 mg | ORAL_CAPSULE | Freq: Every day | ORAL | Status: DC
  Administered 2018-08-18: 100 mg via ORAL
  Filled 2018-08-16 (×4): qty 1

## 2018-08-16 MED ORDER — LABETALOL HCL 5 MG/ML IV SOLN: 80.0000 mg | INTRAVENOUS | Status: DC | PRN

## 2018-08-16 MED ORDER — ZOLPIDEM TARTRATE 5 MG PO TABS: 5.0000 mg | ORAL_TABLET | Freq: Every evening | ORAL | Status: DC | PRN

## 2018-08-16 MED ORDER — BETAMETHASONE SOD PHOS & ACET 6 (3-3) MG/ML IJ SUSP
12.0000 mg | Freq: Once | INTRAMUSCULAR | Status: AC
  Administered 2018-08-16: 12 mg via INTRAMUSCULAR

## 2018-08-16 MED ORDER — HYDRALAZINE HCL 20 MG/ML IJ SOLN: 10.0000 mg | INTRAMUSCULAR | Status: DC | PRN

## 2018-08-16 NOTE — MAU Note (Signed)
Pt states that she has only felt the baby move twice today.  Pt states the last time she felt movement was at 0800.  Pt states "I feel him now" during MAU triage questions.   Denies vaginal bleeding or LOF.

## 2018-08-16 NOTE — MAU Provider Note (Signed)
Chief Complaint:  Decreased Fetal Movement   None     HPI: Miranda Shah is a 42 y.o. G2P0010 at [redacted]w[redacted]d pt of [facility] who presents to maternity admissions reporting decreased fetal movement today.  She is feeling normal fetal movement now in MAU.  She initially had elevated LFTs and then had HTN for the first time 1 week ago.  She was given BMZ injections x 2 in MAU and is in antenatal testing with weekly labs with [facility].  She denies h/a, epigastric pain, or visual disturbances. She has not tried any treatments. There are no other associated symptoms.    HPI  Past Medical History: Past Medical History:  Diagnosis Date  . Anemia   . Seborrheic dermatitis     Past obstetric history: OB History  Gravida Para Term Preterm AB Living  2       1 0  SAB TAB Ectopic Multiple Live Births    1          # Outcome Date GA Lbr Len/2nd Weight Sex Delivery Anes PTL Lv  2 Current           1 TAB             Past Surgical History: Past Surgical History:  Procedure Laterality Date  . NO PAST SURGERIES      Family History: History reviewed. No pertinent family history.  Social History: Social History   Tobacco Use  . Smoking status: Former Smoker    Packs/day: 0.50    Last attempt to quit: 08/03/2013    Years since quitting: 5.0  . Smokeless tobacco: Never Used  Substance Use Topics  . Alcohol use: Not Currently    Comment: occasional  . Drug use: Not Currently    Types: Marijuana    Comment: occasional    Allergies:  Allergies  Allergen Reactions  . Pecan Extract Allergy Skin Test Itching    Pecan and walnuts makes Mouth itch  . Pollen Extract Itching    Itching, red eyes. sneezing    Meds:  Medications Prior to Admission  Medication Sig Dispense Refill Last Dose  . calcium carbonate (TUMS - DOSED IN MG ELEMENTAL CALCIUM) 500 MG chewable tablet Chew 3 tablets by mouth daily as needed for indigestion or heartburn.   Taking  . Iron-FA-B Cmp-C-Biot-Probiotic (FUSION PLUS) CAPS  Take 1 capsule by mouth daily.   Taking  . Prenatal Vit-Fe Fum-Fe Bisg-FA (NATACHEW) 28-1 MG CHEW Chew 1 each by mouth at bedtime.    Taking    ROS:  Review of Systems  Constitutional: Negative for chills, fatigue and fever.  Eyes: Negative for visual disturbance.  Respiratory: Negative for shortness of breath.   Cardiovascular: Negative for chest pain.  Gastrointestinal: Negative for abdominal pain, nausea and vomiting.  Genitourinary: Negative for difficulty urinating, dysuria, flank pain, pelvic pain, vaginal bleeding, vaginal discharge and vaginal pain.  Neurological: Negative for dizziness and headaches.  Psychiatric/Behavioral: Negative.      I have reviewed patient's Past Medical Hx, Surgical Hx, Family Hx, Social Hx, medications and allergies.   Physical Exam   Patient Vitals for the past 24 hrs:  BP Temp Temp src Pulse Resp SpO2 Weight  08/16/18 2000 139/68 - - (!) 105 - 98 % -  08/16/18 1945 138/83 - - (!) 106 - 97 % -  08/16/18 1930 (!) 157/76 - - (!) 105 - 98 % -  08/16/18 1923 (!) 152/80 - - (!) 103 - - -  08/16/18 1905 (!) 158/79 - - (!) 112 - 99 % -  08/16/18 1903 (!) 158/79 98.3 F (36.8 C) Oral (!) 111 12 99 % 100.4 kg   Constitutional: Well-developed, well-nourished female in no acute distress.  Cardiovascular: normal rate Respiratory: normal effort GI: Abd soft, non-tender, gravid appropriate for gestational age.  MS: Extremities nontender, no edema, normal ROM Neurologic: Alert and oriented x 4.  GU: Neg CVAT.  PELVIC EXAM: Cervix pink, visually closed, without lesion, scant white creamy discharge, vaginal walls and external genitalia normal Bimanual exam: Cervix 0/[place]rowning/high, firm, anterior, neg CMT, uterus nontender, nonenlarged, adnexa without tenderness, enlargement, or mass     FHT:  Baseline 135 , moderate variability, accelerations present, no decelerations Contractions: None on toco or to palpation   Labs: Results for orders placed or  performed during the hospital encounter of 08/16/18 (from the past 24 hour(s))  CBC     Status: Abnormal   Collection Time: 08/16/18  7:52 PM  Result Value Ref Range   WBC 17.2 (H) 4.0 - 10.5 K/uL   RBC 3.39 (L) 3.87 - 5.11 MIL/uL   Hemoglobin 9.9 (L) 12.0 - 15.0 g/dL   HCT 40.9 (L) 81.1 - 91.4 %   MCV 86.1 80.0 - 100.0 fL   MCH 29.2 26.0 - 34.0 pg   MCHC 33.9 30.0 - 36.0 g/dL   RDW 78.2 95.6 - 21.3 %   Platelets 318 150 - 400 K/uL   nRBC 0.0 0.0 - 0.2 %      Imaging:    MAU Course/MDM: Pt with normal fetal movement in MAU NST reviewed and reactive Hx elevated LFTs with continued HTN today.  Pt asymptomatic for preeclampsia. Consult Dr Mora Appl with presentation, exam findings and test results.  Admit for further evaluation Kathalene Frames, CNM to room to admit pt    Sharen Counter Certified Nurse-Midwife 08/16/2018 8:19 PM

## 2018-08-16 NOTE — Progress Notes (Signed)
Miranda Shah here for second Betamethasone  Injection.  Injection administered without complication. Patient tolerated well.  Ralene Bathe, RN 08/16/2018  2:10 PM

## 2018-08-16 NOTE — H&P (Signed)
Miranda Shah is a 42 y.o. female presenting for gestational hypertension and elevated liver function tests. OB History    Gravida  2   Para      Term      Preterm      AB  1   Living  0     SAB      TAB  1   Ectopic      Multiple      Live Births             Past Medical History:  Diagnosis Date  . Anemia   . Seborrheic dermatitis    Past Surgical History:  Procedure Laterality Date  . NO PAST SURGERIES     Family History: family history is not on file. Social History:  reports that she quit smoking about 5 years ago. She smoked 0.50 packs per day. She has never used smokeless tobacco. She reports that she drank alcohol. She reports that she has current or past drug history. Drug: Marijuana.     Maternal Diabetes: No Genetic Screening: Declined Maternal Ultrasounds/Referrals: Patient scheduled for abdominal ultrasound to rule out gallstones 08/18/18 Fetal Ultrasounds or other Referrals:  None Maternal Substance Abuse:  No Significant Maternal Medications:  None Significant Maternal Lab Results:  None Other Comments:  None  Review of Systems  Eyes: Negative for blurred vision.  Gastrointestinal: Negative for abdominal pain.  Neurological: Negative for headaches.  All other systems reviewed and are negative.  History    Vitals:   08/16/18 1923 08/16/18 1930 08/16/18 1945 08/16/18 2000  BP: (!) 152/80 (!) 157/76 138/83 139/68  Pulse: (!) 103 (!) 105 (!) 106 (!) 105  Resp:      Temp:      TempSrc:      SpO2:  98% 97% 98%  Weight:       Results for orders placed or performed during the hospital encounter of 08/16/18 (from the past 24 hour(s))  CBC     Status: Abnormal   Collection Time: 08/16/18  7:52 PM  Result Value Ref Range   WBC 17.2 (H) 4.0 - 10.5 K/uL   RBC 3.39 (L) 3.87 - 5.11 MIL/uL   Hemoglobin 9.9 (L) 12.0 - 15.0 g/dL   HCT 16.1 (L) 09.6 - 04.5 %   MCV 86.1 80.0 - 100.0 fL   MCH 29.2 26.0 - 34.0 pg   MCHC 33.9 30.0 - 36.0 g/dL   RDW 40.9 81.1 - 91.4 %   Platelets 318 150 - 400 K/uL   nRBC 0.0 0.0 - 0.2 %  Comprehensive metabolic panel     Status: Abnormal   Collection Time: 08/16/18  7:52 PM  Result Value Ref Range   Sodium 135 135 - 145 mmol/L   Potassium 4.3 3.5 - 5.1 mmol/L   Chloride 104 98 - 111 mmol/L   CO2 21 (L) 22 - 32 mmol/L   Glucose, Bld 167 (H) 70 - 99 mg/dL   BUN 13 6 - 20 mg/dL   Creatinine, Ser 7.82 0.44 - 1.00 mg/dL   Calcium 9.4 8.9 - 95.6 mg/dL   Total Protein 7.4 6.5 - 8.1 g/dL   Albumin 3.1 (L) 3.5 - 5.0 g/dL   AST 213 (H) 15 - 41 U/L   ALT 224 (H) 0 - 44 U/L   Alkaline Phosphatase 113 38 - 126 U/L   Total Bilirubin 0.1 (L) 0.3 - 1.2 mg/dL   GFR calc non Af Amer >60 >60 mL/min  GFR calc Af Amer >60 >60 mL/min   Anion gap 10 5 - 15  Protein / creatinine ratio, urine     Status: None   Collection Time: 08/16/18  8:23 PM  Result Value Ref Range   Creatinine, Urine 186.00 mg/dL   Total Protein, Urine 24 mg/dL   Protein Creatinine Ratio 0.13 0.00 - 0.15 mg/mg[Cre]     Exam Physical Exam  Vitals reviewed. Constitutional: She is oriented to person, place, and time. She appears well-developed and well-nourished.  HENT:  Head: Normocephalic and atraumatic.  Eyes: Pupils are equal, round, and reactive to light.  Cardiovascular: Normal rate, regular rhythm and normal heart sounds.  Respiratory: Effort normal and breath sounds normal.  GI: There is no tenderness.  Musculoskeletal: Normal range of motion.  Neurological: She is alert and oriented to person, place, and time.  Skin: Skin is warm and dry.  Psychiatric: She has a normal mood and affect. Her behavior is normal. Judgment and thought content normal.    Prenatal labs: ABO, Rh:  A+ Antibody:  Negative Rubella:  Immune RPR:   NR HBsAg:   NR HIV:   NR GBS:   Pending   Assessment/Plan: 42 y.o. G2P0 at [redacted]w[redacted]d Gestational hypertension with elevated liver function tests and without proteinuria  Betamethasone x2 doses,  second dose given today Reactive NST Consulted Dr. Mora Appl: -Admit for observation on antepartum floor -Start magnesium sulfate per protocol  -Schedule MFM consult for tomorrow  -Continue to monitor blood pressures overnight  Repeat CBC and CMP ordered for the morning   Janeece Riggers 08/16/2018, 10:01 PM

## 2018-08-17 ENCOUNTER — Inpatient Hospital Stay (HOSPITAL_COMMUNITY): Payer: 59

## 2018-08-17 DIAGNOSIS — O133 Gestational [pregnancy-induced] hypertension without significant proteinuria, third trimester: Principal | ICD-10-CM

## 2018-08-17 DIAGNOSIS — R748 Abnormal levels of other serum enzymes: Secondary | ICD-10-CM

## 2018-08-17 HISTORY — DX: Abnormal levels of other serum enzymes: R74.8

## 2018-08-17 LAB — CBC WITH DIFFERENTIAL/PLATELET
Basophils Absolute: 0 10*3/uL (ref 0.0–0.1)
Basophils Relative: 0 %
Eosinophils Absolute: 0 10*3/uL (ref 0.0–0.5)
Eosinophils Relative: 0 %
HCT: 26.1 % — ABNORMAL LOW (ref 36.0–46.0)
Hemoglobin: 8.8 g/dL — ABNORMAL LOW (ref 12.0–15.0)
LYMPHS ABS: 2 10*3/uL (ref 0.7–4.0)
LYMPHS PCT: 11 %
MCH: 29.1 pg (ref 26.0–34.0)
MCHC: 33.7 g/dL (ref 30.0–36.0)
MCV: 86.4 fL (ref 80.0–100.0)
MONO ABS: 0.5 10*3/uL (ref 0.1–1.0)
MONOS PCT: 3 %
Neutro Abs: 15.9 10*3/uL — ABNORMAL HIGH (ref 1.7–7.7)
Neutrophils Relative %: 86 %
Platelets: 306 10*3/uL (ref 150–400)
RBC: 3.02 MIL/uL — ABNORMAL LOW (ref 3.87–5.11)
RDW: 14.2 % (ref 11.5–15.5)
WBC: 18.4 10*3/uL — AB (ref 4.0–10.5)
nRBC: 0 % (ref 0.0–0.2)

## 2018-08-17 LAB — COMPREHENSIVE METABOLIC PANEL
ALT: 221 U/L — AB (ref 0–44)
AST: 159 U/L — AB (ref 15–41)
Albumin: 2.7 g/dL — ABNORMAL LOW (ref 3.5–5.0)
Alkaline Phosphatase: 104 U/L (ref 38–126)
Anion gap: 8 (ref 5–15)
BUN: 12 mg/dL (ref 6–20)
CHLORIDE: 106 mmol/L (ref 98–111)
CO2: 21 mmol/L — ABNORMAL LOW (ref 22–32)
CREATININE: 0.71 mg/dL (ref 0.44–1.00)
Calcium: 8.3 mg/dL — ABNORMAL LOW (ref 8.9–10.3)
GFR calc Af Amer: >60 mL/min (ref >60)
Glucose, Bld: 137 mg/dL — ABNORMAL HIGH (ref 70–99)
Potassium: 4 mmol/L (ref 3.5–5.1)
Sodium: 135 mmol/L (ref 135–145)
Total Bilirubin: 0.3 mg/dL (ref 0.3–1.2)
Total Protein: 7.4 g/dL (ref 6.5–8.1)

## 2018-08-17 LAB — MAGNESIUM: Magnesium: 4.4 mg/dL — ABNORMAL HIGH (ref 1.7–2.4)

## 2018-08-17 LAB — GROUP B STREP BY PCR: Group B strep by PCR: NEGATIVE

## 2018-08-17 LAB — URIC ACID: Uric Acid, Serum: 6.5 mg/dL (ref 2.5–7.1)

## 2018-08-17 LAB — ABO/RH: ABO/RH(D): A POS

## 2018-08-17 NOTE — Progress Notes (Signed)
Hospital day # 1 pregnancy at [redacted]w[redacted]d--presented for elevated liver enzymes and 2 high BP in MAU.  S:  Pt denies headache, blurred vision or epigastric pain.  Pt denies exposure to hepatitis.  Denies itching of hands and feet. Does occ have heartburn after greasy meal that radiates to her back. Perception of contractions: none      Vaginal bleeding: none now       Vaginal discharge:None  O: BP 111/63 (BP Location: Left Arm)   Pulse 81   Temp 98.1 F (36.7 C) (Oral)   Resp 18   Ht 5\' 3"  (1.6 m)   Wt 100.4 kg   LMP 12/26/2017   SpO2 97%   BMI 39.21 kg/m       Fetal tracings:Cat 1      Contractions:   None      Uterus gravid and non-tender      Extremities: extremities normal, atraumatic, no cyanosis or edema and no significant edema and no signs of DVT          Labs:   Results for orders placed or performed during the hospital encounter of 08/16/18 (from the past 24 hour(s))  CBC     Status: Abnormal   Collection Time: 08/16/18  7:52 PM  Result Value Ref Range   WBC 17.2 (H) 4.0 - 10.5 K/uL   RBC 3.39 (L) 3.87 - 5.11 MIL/uL   Hemoglobin 9.9 (L) 12.0 - 15.0 g/dL   HCT 16.1 (L) 09.6 - 04.5 %   MCV 86.1 80.0 - 100.0 fL   MCH 29.2 26.0 - 34.0 pg   MCHC 33.9 30.0 - 36.0 g/dL   RDW 40.9 81.1 - 91.4 %   Platelets 318 150 - 400 K/uL   nRBC 0.0 0.0 - 0.2 %  Comprehensive metabolic panel     Status: Abnormal   Collection Time: 08/16/18  7:52 PM  Result Value Ref Range   Sodium 135 135 - 145 mmol/L   Potassium 4.3 3.5 - 5.1 mmol/L   Chloride 104 98 - 111 mmol/L   CO2 21 (L) 22 - 32 mmol/L   Glucose, Bld 167 (H) 70 - 99 mg/dL   BUN 13 6 - 20 mg/dL   Creatinine, Ser 7.82 0.44 - 1.00 mg/dL   Calcium 9.4 8.9 - 95.6 mg/dL   Total Protein 7.4 6.5 - 8.1 g/dL   Albumin 3.1 (L) 3.5 - 5.0 g/dL   AST 213 (H) 15 - 41 U/L   ALT 224 (H) 0 - 44 U/L   Alkaline Phosphatase 113 38 - 126 U/L   Total Bilirubin 0.1 (L) 0.3 - 1.2 mg/dL   GFR calc non Af Amer >60 >60 mL/min   GFR calc Af Amer >60  >60 mL/min   Anion gap 10 5 - 15  Protein / creatinine ratio, urine     Status: None   Collection Time: 08/16/18  8:23 PM  Result Value Ref Range   Creatinine, Urine 186.00 mg/dL   Total Protein, Urine 24 mg/dL   Protein Creatinine Ratio 0.13 0.00 - 0.15 mg/mg[Cre]  Type and screen [facility]     Status: None   Collection Time: 08/16/18  9:56 PM  Result Value Ref Range   ABO/RH(D) A POS    Antibody Screen NEG    Sample Expiration      08/19/2018 Performed at [facility], [address], [place], [place] [address]   ABO/Rh     Status: None   Collection  Time: 08/16/18  9:56 PM  Result Value Ref Range   ABO/RH(D)      A POS Performed at [facility], [address], [place], [place] [address]   Group B strep by PCR     Status: None   Collection Time: 08/16/18 11:37 PM  Result Value Ref Range   Group B strep by PCR NEGATIVE NEGATIVE  Comprehensive metabolic panel     Status: Abnormal   Collection Time: 08/17/18  5:16 AM  Result Value Ref Range   Sodium 135 135 - 145 mmol/L   Potassium 4.0 3.5 - 5.1 mmol/L   Chloride 106 98 - 111 mmol/L   CO2 21 (L) 22 - 32 mmol/L   Glucose, Bld 137 (H) 70 - 99 mg/dL   BUN 12 6 - 20 mg/dL   Creatinine, Ser 6.04 0.44 - 1.00 mg/dL   Calcium 8.3 (L) 8.9 - 10.3 mg/dL   Total Protein 7.4 6.5 - 8.1 g/dL   Albumin 2.7 (L) 3.5 - 5.0 g/dL   AST 540 (H) 15 - 41 U/L   ALT 221 (H) 0 - 44 U/L   Alkaline Phosphatase 104 38 - 126 U/L   Total Bilirubin 0.3 0.3 - 1.2 mg/dL   GFR calc non Af Amer >60 >60 mL/min   GFR calc Af Amer >60 >60 mL/min   Anion gap 8 5 - 15  CBC with Differential     Status: Abnormal   Collection Time: 08/17/18  5:16 AM  Result Value Ref Range   WBC 18.4 (H) 4.0 - 10.5 K/uL   RBC 3.02 (L) 3.87 - 5.11 MIL/uL   Hemoglobin 8.8 (L) 12.0 - 15.0 g/dL   HCT 98.1 (L) 19.1 - 47.8 %   MCV 86.4 80.0 - 100.0 fL   MCH 29.1 26.0 - 34.0 pg   MCHC 33.7 30.0 - 36.0 g/dL   RDW 29.5 62.1 - 30.8 %   Platelets 306  150 - 400 K/uL   nRBC 0.0 0.0 - 0.2 %   Neutrophils Relative % 86 %   Neutro Abs 15.9 (H) 1.7 - 7.7 K/uL   Lymphocytes Relative 11 %   Lymphs Abs 2.0 0.7 - 4.0 K/uL   Monocytes Relative 3 %   Monocytes Absolute 0.5 0.1 - 1.0 K/uL   Eosinophils Relative 0 %   Eosinophils Absolute 0.0 0.0 - 0.5 K/uL   Basophils Relative 0 %   Basophils Absolute 0.0 0.0 - 0.1 K/uL  Magnesium     Status: Abnormal   Collection Time: 08/17/18  5:16 AM  Result Value Ref Range   Magnesium 4.4 (H) 1.7 - 2.4 mg/dL  Uric acid     Status: None   Collection Time: 08/17/18  5:16 AM  Result Value Ref Range   Uric Acid, Serum 6.5 2.5 - 7.1 mg/dL         Meds: Magnesium Sulfate still running.  A: [redacted]w[redacted]d with IUP with elevated liver enzymes.  No further BP elevations.     stable  P: Continue current plan of care      Upcoming tests/treatments:  MFM consult, Gallbladder US on Friday out patient      MDs will follow  Kenney Houseman CNM, MSN 08/17/2018 10:49 AM

## 2018-08-17 NOTE — Consult Note (Signed)
Follow-up MFM Consultation  Name: Miranda Shah MRN: [id number]  Requesting Provider: Dr. Osborn Coho, MD  Miranda Shah, G2 P0 at 32w 5d gestation was admitted yesterday from the MAU where she came first for c/o decreased fetal movements. Her blood pressures on arrival were 152/80 and 157/76 mm Hg.  She does not have symptoms of severe headache or visual disturbances or right upper quadrant pain or vaginal bleeding. Patient reports good fetal movements now.  Since admission, the patient is on magnesium sulfate infusion. She had received antenatal corticosteroids.  She had an MFM consultation with Dr. Perry Mount last week (08/08/18).  Of note, the liver enzymes are increased, but the platelet counts have been normal. Patient reports her hepatic panel screening was normal. She will be going for right upper quadrant ultrasound tomorrow. She does not have symptoms of nausea or vomiting.  PMH: No history of chronic hypertension or diabetes. Allergioes: Pollen, walnut, pecan. No drug allergies. Social: Ex-smoker; quit in 2014. No history of alcohol or drug use. Her partner is an Tree surgeon and he is in good health.  P/E: Patient is comfortably lying in bed; not in distress. Abdomen: Soft gravid uterus; no tenderness. Minimal pedal edema. NST is reactive.  Labs (previous lab results of 08/08/18 in parenthesis): AST 159 (143), ALT 221 (248), electrolytes normal, Hb 8.8, Hct 26.1, PLT 306, WBC 18.4, protein/creatinine ratio 0.13 (normal), serum creatinine 0.71.  I counseled the patient on the following: Gestational hypertension:  Clinical and lab features are consistent with gestational hypertension. Patient does not have severe features of preeclampsia. Increased liver enzymes are stable and are not consistent with the diagnosis of HELLP syndrome. Right upper quadrant ultrasound will be helpful in ruling out gallstones or other pathology.   We discussed timing of delivery. In the absence of  severe features, I recommend delivery at 37 weeks. I recommended that she monitors her blood pressures at home and discussed the threshold (SBP above 150 and/or DBP above 100 mm Hg) to call your office.  Patient would like to go home today. I discussed with Dr. Su Hilt later and we made a plan of inpatient management for another 24 to 48 hours to monitor her blood pressures.  Recommendations: -Discontinue magnesium sulfate. -Upper abdomen ultrasound tomorrow. -Follow hepatic panel results. -Repeat LFT in 3 days and if stable, weekly LFTs should be sufficient. -Weekly BPP till delivery.  Thank you for your consult. Please do not hesitate to contact me if you have any questions or concerns.  Consultation including face-to-face counseling: 30 min.

## 2018-08-18 ENCOUNTER — Inpatient Hospital Stay (HOSPITAL_COMMUNITY): Payer: 59

## 2018-08-18 ENCOUNTER — Other Ambulatory Visit (HOSPITAL_COMMUNITY): Payer: 59

## 2018-08-18 ENCOUNTER — Inpatient Hospital Stay: Admission: RE | Admit: 2018-08-18 | Payer: 59 | Source: Ambulatory Visit

## 2018-08-18 LAB — CREATININE, URINE, 24 HOUR
COLLECTION INTERVAL-UCRE24: 24 h
Creatinine, 24H Ur: 1563 mg/d (ref 600–1800)
Creatinine, Urine: 115.8 mg/dL
URINE TOTAL VOLUME-UCRE24: 1350 mL

## 2018-08-18 LAB — PROTEIN, URINE, 24 HOUR
COLLECTION INTERVAL-UPROT: 24 h
PROTEIN, URINE: 18 mg/dL
Protein, 24H Urine: 243 mg/d — ABNORMAL HIGH (ref 50–100)
URINE TOTAL VOLUME-UPROT: 1350 mL

## 2018-08-18 NOTE — Progress Notes (Addendum)
Hospital day # 2 pregnancy at [redacted]w[redacted]d--presented for elevated liver enzymes and 2 high BP in MAU. Dx with GHTN.  S:  Pt denies headache, blurred vision or epigastric pain.  Pt denies exposure to hepatitis.  Denies itching of hands and feet. Does occ have heartburn after greasy meal that radiates to her back. Pt denies cxt, vaginal bleeding or leakage of fluids. Pt endorses +FM.   O: BP 129/69 (BP Location: Left Arm)   Pulse 72   Temp 98.5 F (36.9 C) (Oral)   Resp 16   Ht 5\' 3"  (1.6 m)   Wt 100.4 kg   LMP 12/26/2017   SpO2 96%   BMI 39.21 kg/m       Fetal tracings:Cat 1      Contractions:   None      Uterus gravid and non-tender      Extremities: extremities normal, atraumatic, no cyanosis or edema and no significant edema and no signs of DVT  P/E: Patient is comfortably lying in bed; not in distress. Abdomen: Soft gravid uterus; no tenderness. Minimal pedal edema. DTR 2+ patellar. No clonus. NST is reactive.         Labs:   Labs (previous lab results of 08/08/18 in parenthesis): AST 159 (143), ALT 221 (248), electrolytes normal, Hb 8.8, Hct 26.1, PLT 306, WBC 18.4, protein/creatinine ratio 0.13 (normal), serum creatinine 0.71.  08/04/2018 @ [facility]:  cholic acid  5.1 high deoxycholic acid  <0.5 chenodeoxycholic acid  2.9 total bile acids  8.0  On 07/25/2018 @ [facility]: AST  160 high ALT  272 high        Meds: Magnesium Sulfate off @ 1410 on 10/17.  A: [redacted]w[redacted]d with IUP with GHTN with elevated liver enzymes. Asymptomatic for preeclampsia, no HA, vision changes or epigastric pain. Biles acid = .Stable. Last BP 129/69. GBS Negative. BMZ x2 doses.  Bile acid 8, cholic acid 5.1, asymptomatic. Elevated LFT on 09/24 were AST 160, ALT 272, which 10/17 LFT were decreases to AST 159, ALT 221.  Per MFM on 10/17: Gestational hypertension:  Clinical and lab features are consistent with gestational hypertension. Patient does not have severe features of preeclampsia. Increased liver  enzymes are stable and are not consistent with the diagnosis of HELLP syndrome. Right upper quadrant ultrasound will be helpful in ruling out gallstones or other pathology.   P: Continue current plan of care      Dr Normand Sloop to follow today, plans to continue to monitor pt in-pt.      Gallbladder US today: Results Pending      MDs will follow  Per MFM on 10/17: We discussed timing of delivery. In the absence of severe features, I recommend delivery at 37 weeks. I recommended that she monitors her blood pressures at home and discussed the threshold (SBP above 150 and/or DBP above 100 mm Hg) to call your office. Recommendations: -Discontinue magnesium sulfate. -Upper abdomen on 10/18 -Follow hepatic panel results. -Repeat LFT in 3 days and if stable, weekly LFTs should be sufficient. -Weekly BPP till delivery.  Rolly Pancake, FNP, MSN 08/18/2018 9:31 AM   Pt seen and examined.   She denies HA, blurred vision, RUQ pain BP 127/79 (BP Location: Left Arm)   Pulse 75   Temp 98.6 F (37 C) (Oral)   Resp 18   Ht 5\' 3"  (1.6 m)   Wt 100.4 kg   LMP 12/26/2017   SpO2 99%   BMI 39.21 kg/m  Abdomen soft NT Ext  no CCE Pt stable and desires to go home to take care of business.  I did offer her to stay until delivery She plans to be seen in our office twice weekly MFM once a week Will do outpt surgery consult.  Her [place] sig for gallstones.  Im not convinced its the cause for her elevated LFTS

## 2018-08-18 NOTE — Discharge Instructions (Signed)
Liver Function Tests  Liver function tests are blood tests to see how well your liver is working. The proteins and enzymes measured in the test can alert your health care provider to inflammation, damage, or disease in your liver. It is common to have liver function tests:   During annual physical exams.   When you are taking certain medicines.   If you have liver disease.   If you drink a lot of alcohol.   When you are not feeling well.   When you have other conditions that may affect the liver.    Substances measured may include:   Alanine transaminase (ALT). This is an enzyme in the liver.   Aspartate transaminase (AST). This is an enzyme in the liver, heart, and muscles.   Alkaline phosphatase (ALP). This is a protein in the liver, bile ducts, and bone. It is also in other body tissues.   Total bilirubin. This is a yellow pigment in bile.   Albumin. This is a protein in the liver.   Prothrombin time and international normalized ratio (PT and INR). PT measures the time that it takes for your blood to clot. INR is a calculation of blood clotting time based upon your PT result. It is also calculated based on normal ranges defined by the laboratory that processed your lab test.   Total protein. This measures two proteins, albumin and globulin, found in the blood.    How do I prepare for this test?  How you prepare will depend on which tests are being done and the reason why these tests are being done. You may need to:   Avoid eating for 4-6 hours before the test or as directed by your health care provider.   Stop taking certain medicines prior to your blood test as directed by your health care provider.    What do the results mean?  It is your responsibility to obtain your test results. Ask the lab or department performing the test when and how you will get your results. Contact your health care provider to discuss any questions you have about your results.  RANGE OF NORMAL VALUES  Ranges for normal  values may vary among different labs and hospitals. You should always check with your health care provider after having lab work or other tests done to discuss the meaning of your test results and whether your values are considered within normal limits.  The following are normal ranges for substances measured in liver function tests:  ALT   Infant: may be twice as high as adult values.   Child or adult: 4-36 international units/L at 37C or 4-36 units/L (SI units).   Elderly: may be slightly higher than adult values.  AST   Newborn 0-5 days old: 35-140 units/L.   Child under 3 years old: 15-60 units/L.   3-6 years old: 15-50 units/L.   6-12 years old: 10-50 units/L.   12-18 years old: 10-40 units/L.   Adult: 0-35 units/L or 0-0.58 microkatal/L (SI units).   Elderly: slightly higher than adults.  ALP   Child under 2 years old: 85-235 units/L.   2-8 years old: 65-210 units/L.   9-15 years old: 60-300 units/L.   16-21 years old: 30-200 units/L.   Adult: 30-120 units/L or 0.5-2.0 microkatal/L (SI units).   Elderly: slightly higher than adult.  Total bilirubin   Newborn: 1.0-12.0 mg/dL or 17.1-205 micromoles/L (SI units).   Adult, elderly, or child: 0.3-1.0 mg/dL or 5.1-17 micromoles/L.  Albumin     Premature infant: 3.0-4.2 g/dL.   Newborn: 3.5-5.4 g/dL.   Infant: 4.4-5.4 g/dL.   Child: 4.0-5.9 g/dL.   Adult or elderly: 3.5-5.0 g/dL or 35-50 g/L (SI units).  PT   11.0-12.5 seconds; 85%-100%.  INR   0.8-1.1.  Total protein   Premature infant: 4.2-7.6 g/dL.   Newborn: 4.6-7.4 g/dL.   Infant: 6.0-6.7 g/dL.   Child: 6.2-8.0 g/dL.   Adult or elderly: 6.4-8.3 g/dL or 64-83 g/L (SI units).  MEANING OF RESULTS OUTSIDE NORMAL VALUE RANGES  Sometimes test results can be abnormal due to other factors, such as medicines, exercise, or pregnancy. Follow up with your health care provider if you have any questions about test results outside the normal value ranges.  ALT   Levels above the normal range,  along with other test results, may indicate liver disease.  AST   Levels above the normal range, along with other test results, may indicate liver disease. Sometimes levels also increase after burns, surgery, heart attack, muscle damage, or seizure.  ALP   Levels above the normal range, along with other test results, may indicate biliary obstruction, diseases of the liver, bone disease, thyroid disease, tumors, fractures, leukemia or lymphoma, or several other conditions. People with blood type O or B may show higher levels after a fatty meal.   Levels below the normal range, along with other test results, may indicate bone and teeth conditions, malnutrition, protein deficiency, or Wilson disease.  Total bilirubin   Levels above the normal range, along with other test results, may indicate problems with the liver, gallbladder, or bile ducts.  Albumin   Levels above the normal range, along with other test results, may indicate dehydration. They may also be caused by a diet that is high in protein. Sometimes, the band placed around the upper arm during the process of drawing blood can cause the level of this protein in your blood to rise and give you a result above the normal range.   Levels below the normal range, along with other tests results, may indicate kidney disease, liver disease, or malabsorption of nutrients.  PT and INR   Levels above the normal range mean your blood is clotting slower than normal. This may be due to blood disorders, liver disorders, or low levels of vitamin K.  Total protein   Levels above the normal range, along with other test results, may be due to infection or other diseases.   Levels below the normal range, along with other test results, may be due to an immune system disorder, bleeding, burns, kidney disorder, liver disease, trouble absorbing or getting enough nutrients, or other conditions that affect the intestines.  Talk with your health care provider to discuss your  results, treatment options, and if necessary, the need for more tests. Talk with your health care provider if you have any questions about your results.  This information is not intended to replace advice given to you by your health care provider. Make sure you discuss any questions you have with your health care provider.  Document Released: 11/20/2004 Document Revised: 06/23/2016 Document Reviewed: 02/21/2014  Elsevier Interactive Patient Education  2018 [employer]

## 2018-08-18 NOTE — Progress Notes (Signed)
Patient discharged home.  We discussed preeclampsia and signs of preterm labor.  She verbalizes understanding.  He vitals have been wnl and she is ready for discharge.  24 hour urine was sent at 16:30.

## 2018-08-18 NOTE — Discharge Summary (Signed)
ANtenatal OB Discharge Summary     Patient Name: Miranda Shah DOB: 1976/02/11 MRN: [id number]  Date of admission: 08/16/2018 Delivering MD: This patient has no babies on file. Date of admit: 08/16/2018 Type of admit: GHTN with elevated LFT  Admitting diagnosis: DECREASED FETAL MVMNT Intrauterine pregnancy: [redacted]w[redacted]d     Secondary diagnosis:  Active Problems:   Gestational hypertension   Elevated liver enzymes                              History of Present Illness: Ms. Miranda Shah is a 42 y.o. female, G2P0010, who presents at [redacted]w[redacted]d weeks gestation. The patient has been followed at  [facility]  Her pregnancy has been complicated by:  Patient Active Problem List   Diagnosis Date Noted  . Elevated liver enzymes 08/17/2018  . Gestational hypertension 08/16/2018  . Gestational hypertension without significant proteinuria in third trimester 08/08/2018  . Liver and biliary tract disorders in pregnancy, third trimester 08/03/2018  . Advanced maternal age in multigravida, third trimester 08/03/2018  . Obesity during pregnancy in third trimester 08/03/2018   Hospital course:  103w5d with IUP with GHTN with elevated liver enzymes. Asymptomatic for preeclampsia, no HA, vision changes or epigastric pain. Pt was placed on 12 hours of magnesium, and seen by MFM. Labs (previous lab results of 08/08/18 in parenthesis): AST 159 (143), ALT 221 (248), electrolytes normal, Hb 8.8, Hct 26.1, PLT 306, WBC 18.4, protein/creatinine ratio 0.13 (normal), serum creatinine 0.71. Biles acid = .Stable. Last BP 129/69. GBS Negative. BMZ x2 doses.  Bile acid 8, cholic acid 5.1, asymptomatic. Elevated LFT on 09/24 were AST 160, ALT 272, which 10/17 LFT were decreases to AST 159, ALT 221. NST reactive.  Per Dr Normand Sloop  Pt seen and examined.   She denies HA, blurred vision, RUQ pain BP 127/79 (BP Location: Left Arm)   Pulse 75   Temp 98.6 F (37 C) (Oral)   Resp 18   Ht 5\' 3"  (1.6  m)   Wt 100.4 kg   LMP 12/26/2017   SpO2 99%   BMI 39.21 kg/m  Abdomen soft NT Ext no CCE Pt stable and desires to go home to take care of business.  I did offer her to stay until delivery She plans to be seen in our office twice weekly MFM once a week Will do outpt surgery consult.  Her [place] sig for gallstones.  Im not convinced its the cause for her elevated LFTS  Physical exam  Vitals:   08/17/18 2333 08/18/18 0534 08/18/18 0727 08/18/18 1141  BP: (!) 142/82 122/65 129/69 127/79  Pulse: 73 71 72 75  Resp: 20 20 16 18   Temp: 98.9 F (37.2 C) 98.8 F (37.1 C) 98.5 F (36.9 C) 98.6 F (37 C)  TempSrc: Oral Oral Oral Oral  SpO2: 100% 97% 96% 99%  Weight:      Height:      P/E: Patient is comfortably lying in bed; not in distress. Abdomen: Soft gravid uterus; no tenderness. Minimal pedal edema. DTR 2+ patellar. No clonus. NST is reactive.  Labs: Lab Results  Component Value Date   WBC 18.4 (H) 08/17/2018   HGB 8.8 (L) 08/17/2018   HCT 26.1 (L) 08/17/2018   MCV 86.4 08/17/2018   PLT 306 08/17/2018   CMP Latest Ref Rng & Units 08/17/2018  Glucose 70 - 99 mg/dL 409(W)  BUN 6 -  20 mg/dL 12  Creatinine 1.61 - 0.96 mg/dL 0.45  Sodium 409 - 811 mmol/L 135  Potassium 3.5 - 5.1 mmol/L 4.0  Chloride 98 - 111 mmol/L 106  CO2 22 - 32 mmol/L 21(L)  Calcium 8.9 - 10.3 mg/dL 8.3(L)  Total Protein 6.5 - 8.1 g/dL 7.4  Total Bilirubin 0.3 - 1.2 mg/dL 0.3  Alkaline Phos 38 - 126 U/L 104  AST 15 - 41 U/L 159(H)  ALT 0 - 44 U/L 221(H)    Date of discharge: 08/18/2018 Discharge Diagnoses: GHTN, and elevated LFT, gallstones.  Discharge instruction: per After Visit Summary and "Baby and Me Booklet".  After visit meds:  Allergies as of 08/18/2018      Reactions   Pecan Extract Allergy Skin Test Itching   Pecan and walnuts makes Mouth itch   Pollen Extract Itching   Itching, red eyes. sneezing      Medication List    TAKE these medications   acetaminophen 500 MG  tablet Commonly known as:  TYLENOL Take 1,000 mg by mouth every 8 (eight) hours as needed for mild pain.   calcium carbonate 500 MG chewable tablet Commonly known as:  TUMS - dosed in mg elemental calcium Chew 1-3 tablets by mouth daily as needed for indigestion or heartburn (depends on indigestion if takes 1-3 tablets).   FUSION PLUS Caps Take 1 capsule by mouth daily.   NATACHEW 28-1 MG Chew Chew 1 each by mouth at bedtime.       Activity:           pelvic rest Advance as tolerated. Pelvic rest for 6 weeks.  Diet:                routine Medications: PNV Condition:  Pt discharge to home in stable  Condition and denies vision changes, HA or epigastric pain, 24 hour protein urine sent at 1630 GHTN: She plans to be seen in our office twice weekly MFM once a week, monitor BP at home and return if s/sx of preeclampsia.  Gallstones: Will do outpt surgery consult.  Her [place] sig for gallstones.    Meds: Allergies as of 08/18/2018      Reactions   Pecan Extract Allergy Skin Test Itching   Pecan and walnuts makes Mouth itch   Pollen Extract Itching   Itching, red eyes. sneezing      Medication List    TAKE these medications   acetaminophen 500 MG tablet Commonly known as:  TYLENOL Take 1,000 mg by mouth every 8 (eight) hours as needed for mild pain.   calcium carbonate 500 MG chewable tablet Commonly known as:  TUMS - dosed in mg elemental calcium Chew 1-3 tablets by mouth daily as needed for indigestion or heartburn (depends on indigestion if takes 1-3 tablets).   FUSION PLUS Caps Take 1 capsule by mouth daily.   NATACHEW 28-1 MG Chew Chew 1 each by mouth at bedtime.       Discharge Follow Up:  Follow-up Information    [facility] Follow up.   Specialty:  Obstetrics and Gynecology Why:  Pt to f/u twice weekly with [facility] for ROB visit.  Pt also to make appointments and be seen by MFM weekly.  Contact information: [address] [place] [address] [phone]           [place], NP-C, CNM 08/18/2018, 3:14 PM  [place], FNP

## 2018-08-18 NOTE — Progress Notes (Addendum)
Chart note (results)  I reviewed her labs faxed to our office and they are: -Total bile acids 83mol/L (normal); cholic acid 5.1 (high) IgG 1,377 (normal). -ANA screen: negative. -Actin (smooth muscle) antibody IgG: Negative. -Mitochondria Antibody screen: Negative.  RUQ ultrasound (today): Cholelithiasis without sonographic features of acute cholecystitis.  Elevated transaminases of unclear etiology. Appears benign and nonspecific. Increased cholic acid levels does not seem to be consistent with cholestasis. In the absence of symptoms of itching, I do not recommend treatment.  Follow hepatic panel results.  Recommendations: -Repeat LFTs and bile acids in a week. -If blood pressures are stable (normal to mild hypertensive range) and the patient is asymptomatic, discharge may be considered.

## 2018-08-22 ENCOUNTER — Other Ambulatory Visit (HOSPITAL_COMMUNITY): Payer: Self-pay | Admitting: *Deleted

## 2018-08-22 ENCOUNTER — Encounter (HOSPITAL_COMMUNITY): Payer: Self-pay

## 2018-08-22 ENCOUNTER — Ambulatory Visit (HOSPITAL_BASED_OUTPATIENT_CLINIC_OR_DEPARTMENT_OTHER)
Admission: RE | Admit: 2018-08-22 | Discharge: 2018-08-22 | Disposition: A | Discharge status: Home / Self Care | Payer: 59 | Source: Ambulatory Visit | Attending: Obstetrics and Gynecology | Admitting: Obstetrics and Gynecology

## 2018-08-22 DIAGNOSIS — Z3A33 33 weeks gestation of pregnancy: Secondary | ICD-10-CM | POA: Diagnosis not present

## 2018-08-22 DIAGNOSIS — O09523 Supervision of elderly multigravida, third trimester: Secondary | ICD-10-CM

## 2018-08-22 DIAGNOSIS — O139 Gestational [pregnancy-induced] hypertension without significant proteinuria, unspecified trimester: Secondary | ICD-10-CM | POA: Diagnosis not present

## 2018-08-22 DIAGNOSIS — O10913 Unspecified pre-existing hypertension complicating pregnancy, third trimester: Secondary | ICD-10-CM | POA: Insufficient documentation

## 2018-08-22 DIAGNOSIS — O2693 Pregnancy related conditions, unspecified, third trimester: Secondary | ICD-10-CM | POA: Diagnosis not present

## 2018-08-22 DIAGNOSIS — O4103X0 Oligohydramnios, third trimester, not applicable or unspecified: Secondary | ICD-10-CM

## 2018-08-24 ENCOUNTER — Ambulatory Visit (HOSPITAL_BASED_OUTPATIENT_CLINIC_OR_DEPARTMENT_OTHER)
Admission: RE | Admit: 2018-08-24 | Discharge: 2018-08-24 | Disposition: A | Discharge status: Home / Self Care | Payer: 59 | Source: Ambulatory Visit | Attending: Obstetrics and Gynecology | Admitting: Obstetrics and Gynecology

## 2018-08-24 ENCOUNTER — Other Ambulatory Visit: Payer: Self-pay

## 2018-08-24 ENCOUNTER — Inpatient Hospital Stay (HOSPITAL_COMMUNITY)
Admission: AD | Admit: 2018-08-24 | Discharge: 2018-08-28 | DRG: 833 | Disposition: A | Discharge status: Home / Self Care | Payer: 59 | Attending: Obstetrics and Gynecology | Admitting: Obstetrics and Gynecology

## 2018-08-24 ENCOUNTER — Ambulatory Visit (HOSPITAL_COMMUNITY)
Admission: RE | Admit: 2018-08-24 | Discharge: 2018-08-24 | Disposition: A | Discharge status: Home / Self Care | Payer: 59 | Source: Ambulatory Visit | Attending: Obstetrics and Gynecology | Admitting: Obstetrics and Gynecology

## 2018-08-24 ENCOUNTER — Other Ambulatory Visit (HOSPITAL_COMMUNITY): Payer: Self-pay | Admitting: Obstetrics and Gynecology

## 2018-08-24 ENCOUNTER — Encounter (HOSPITAL_COMMUNITY): Payer: Self-pay

## 2018-08-24 DIAGNOSIS — O4100X0 Oligohydramnios, unspecified trimester, not applicable or unspecified: Secondary | ICD-10-CM

## 2018-08-24 DIAGNOSIS — O321XX0 Maternal care for breech presentation, not applicable or unspecified: Secondary | ICD-10-CM | POA: Diagnosis present

## 2018-08-24 DIAGNOSIS — O09523 Supervision of elderly multigravida, third trimester: Secondary | ICD-10-CM

## 2018-08-24 DIAGNOSIS — O139 Gestational [pregnancy-induced] hypertension without significant proteinuria, unspecified trimester: Secondary | ICD-10-CM

## 2018-08-24 DIAGNOSIS — O133 Gestational [pregnancy-induced] hypertension without significant proteinuria, third trimester: Secondary | ICD-10-CM | POA: Insufficient documentation

## 2018-08-24 DIAGNOSIS — O4103X0 Oligohydramnios, third trimester, not applicable or unspecified: Principal | ICD-10-CM | POA: Diagnosis present

## 2018-08-24 DIAGNOSIS — Z3A33 33 weeks gestation of pregnancy: Secondary | ICD-10-CM

## 2018-08-24 DIAGNOSIS — O289 Unspecified abnormal findings on antenatal screening of mother: Secondary | ICD-10-CM

## 2018-08-24 DIAGNOSIS — Z87891 Personal history of nicotine dependence: Secondary | ICD-10-CM | POA: Diagnosis not present

## 2018-08-24 DIAGNOSIS — O99613 Diseases of the digestive system complicating pregnancy, third trimester: Secondary | ICD-10-CM | POA: Diagnosis present

## 2018-08-24 DIAGNOSIS — Z3A34 34 weeks gestation of pregnancy: Secondary | ICD-10-CM | POA: Diagnosis not present

## 2018-08-24 DIAGNOSIS — O2693 Pregnancy related conditions, unspecified, third trimester: Secondary | ICD-10-CM | POA: Diagnosis not present

## 2018-08-24 DIAGNOSIS — K8020 Calculus of gallbladder without cholecystitis without obstruction: Secondary | ICD-10-CM | POA: Diagnosis present

## 2018-08-24 DIAGNOSIS — O165 Unspecified maternal hypertension, complicating the puerperium: Secondary | ICD-10-CM | POA: Diagnosis present

## 2018-08-24 LAB — COMPREHENSIVE METABOLIC PANEL
ALBUMIN: 2.7 g/dL — AB (ref 3.5–5.0)
ALK PHOS: 108 U/L (ref 38–126)
ALT: 67 U/L — AB (ref 0–44)
ANION GAP: 10 (ref 5–15)
AST: 33 U/L (ref 15–41)
BUN: 8 mg/dL (ref 6–20)
CALCIUM: 8.9 mg/dL (ref 8.9–10.3)
CHLORIDE: 104 mmol/L (ref 98–111)
CO2: 21 mmol/L — AB (ref 22–32)
Creatinine, Ser: 0.71 mg/dL (ref 0.44–1.00)
GFR calc non Af Amer: >60 mL/min (ref >60)
GLUCOSE: 91 mg/dL (ref 70–99)
Potassium: 4.4 mmol/L (ref 3.5–5.1)
SODIUM: 135 mmol/L (ref 135–145)
Total Bilirubin: 0.4 mg/dL (ref 0.3–1.2)
Total Protein: 6.4 g/dL — ABNORMAL LOW (ref 6.5–8.1)

## 2018-08-24 LAB — CBC
HEMATOCRIT: 30.2 % — AB (ref 36.0–46.0)
HEMOGLOBIN: 10.2 g/dL — AB (ref 12.0–15.0)
MCH: 29.2 pg (ref 26.0–34.0)
MCHC: 33.8 g/dL (ref 30.0–36.0)
MCV: 86.5 fL (ref 80.0–100.0)
NRBC: 0.1 % (ref 0.0–0.2)
Platelets: 313 10*3/uL (ref 150–400)
RBC: 3.49 MIL/uL — ABNORMAL LOW (ref 3.87–5.11)
RDW: 14.1 % (ref 11.5–15.5)
WBC: 13 10*3/uL — AB (ref 4.0–10.5)

## 2018-08-24 LAB — PROTEIN / CREATININE RATIO, URINE: Creatinine, Urine: 46 mg/dL

## 2018-08-24 LAB — TYPE AND SCREEN
ABO/RH(D): A POS
Antibody Screen: NEGATIVE

## 2018-08-24 LAB — LACTATE DEHYDROGENASE: LDH: 205 U/L — ABNORMAL HIGH (ref 98–192)

## 2018-08-24 LAB — URIC ACID: Uric Acid, Serum: 5.9 mg/dL (ref 2.5–7.1)

## 2018-08-24 MED ORDER — LABETALOL HCL 5 MG/ML IV SOLN: 40.0000 mg | INTRAVENOUS | Status: DC | PRN

## 2018-08-24 MED ORDER — BETAMETHASONE SOD PHOS & ACET 6 (3-3) MG/ML IJ SUSP
12.0000 mg | INTRAMUSCULAR | Status: DC
  Filled 2018-08-24: qty 2

## 2018-08-24 MED ORDER — PRENATAL MULTIVITAMIN CH
1.0000 | ORAL_TABLET | Freq: Every day | ORAL | Status: DC
  Administered 2018-08-24 – 2018-08-28 (×4): 1 via ORAL
  Filled 2018-08-24 (×4): qty 1

## 2018-08-24 MED ORDER — LABETALOL HCL 5 MG/ML IV SOLN: 20.0000 mg | INTRAVENOUS | Status: DC | PRN

## 2018-08-24 MED ORDER — LABETALOL HCL 5 MG/ML IV SOLN: 80.0000 mg | INTRAVENOUS | Status: DC | PRN

## 2018-08-24 MED ORDER — ZOLPIDEM TARTRATE 5 MG PO TABS: 5.0000 mg | ORAL_TABLET | Freq: Every evening | ORAL | Status: DC | PRN

## 2018-08-24 MED ORDER — HYDRALAZINE HCL 20 MG/ML IJ SOLN: 10.0000 mg | INTRAMUSCULAR | Status: DC | PRN

## 2018-08-24 MED ORDER — ACETAMINOPHEN 325 MG PO TABS: 650.0000 mg | ORAL_TABLET | ORAL | Status: DC | PRN

## 2018-08-24 MED ORDER — FERROUS SULFATE 325 (65 FE) MG PO TABS
325.0000 mg | ORAL_TABLET | Freq: Two times a day (BID) | ORAL | Status: DC
  Administered 2018-08-24 – 2018-08-28 (×8): 325 mg via ORAL
  Filled 2018-08-24 (×9): qty 1

## 2018-08-24 MED ORDER — LACTATED RINGERS IV SOLN
INTRAVENOUS | Status: DC
  Administered 2018-08-24 (×2): via INTRAVENOUS
  Administered 2018-08-25: 125 mL/h via INTRAVENOUS

## 2018-08-24 MED ORDER — CALCIUM CARBONATE ANTACID 500 MG PO CHEW
2.0000 | CHEWABLE_TABLET | ORAL | Status: DC | PRN
  Administered 2018-08-24: 400 mg via ORAL
  Filled 2018-08-24: qty 2

## 2018-08-24 MED ORDER — DOCUSATE SODIUM 100 MG PO CAPS
100.0000 mg | ORAL_CAPSULE | Freq: Every day | ORAL | Status: DC
  Administered 2018-08-24 – 2018-08-28 (×4): 100 mg via ORAL
  Filled 2018-08-24 (×4): qty 1

## 2018-08-24 NOTE — H&P (Signed)
Miranda Shah is a 41 y.o. female @ 21.5, G2 P0010 presenting and being admitted to the third floor for oligohydramnios, Pt was seeing MFM for [place] today and AFI was noted to be 4, BPP today was 10/10, pt was recently admitted for gestational hypertension controlled without meds aand elevated liver function tests. Abdominal US showed gallstone. Pt being followed out-pt. Pt currently denies HA, vision changes, epigastric pain. Pt denies leakage of fluids, vaginal bleeding. Pt endorses +FM. GBS- on 10/18. BMZ x2 on 10/15 & 10/16.  Pregnancy Problems Leukocytosis (WBC ct elevated on NOB labs--recheck at [place], lower at 13. Recheck with glucola, 12.2.) liver function tests abnormal (AST 99/ALT 236--rechecked in 2 weeks, with hep panel.  AST 160/ALT 272, normal PIH labs and PCR.  Referred to MFM--STILL ELEVATED, ADDITIONAL TESTING DONE.  ABDOMINAL US Performed and showed gallstones, WEEKLY BPP FROM 32 WEEKS, GROWTH Q 4 WEEKS. Anemia (Hgb 10.5 at NOB, Rx Fe supplement; 9.8 at 27 weeks.) advanced maternal age gravida (Declines testing.) pregnancy-induced hypertension (BP bordeline for gestational HTN, will continue to monitor closely. [place] for growth q 4 weeks, weekly antenatal testing from 32 weeks.) allergy to nut  Medications Fusion Plus NataChew (Fe Bis-glycinate) OB History    Gravida  2   Para      Term      Preterm      AB  1   Living  0     SAB      TAB  1   Ectopic      Multiple      Live Births             Past Medical History:  Diagnosis Date  . Anemia   . Seborrheic dermatitis    Past Surgical History:  Procedure Laterality Date  . NO PAST SURGERIES     Family History: family history includes Diabetes in her mother; Hyperlipidemia in her maternal grandmother; Hypertension in her father and mother. Social History:  reports that she quit smoking about 5 years ago. She smoked 0.50 packs per day. She has never used smokeless tobacco. She reports that she drank alcohol. She  reports that she has current or past drug history. Drug: Marijuana.     Maternal Diabetes: No Genetic Screening: Declined Maternal Ultrasounds/Referrals: Patient scheduled for abdominal ultrasound to rule out gallstones 08/18/18 Fetal Ultrasounds or other Referrals:  None Maternal Substance Abuse:  No Significant Maternal Medications:  None Significant Maternal Lab Results:  None Other Comments:  None  Review of Systems  Eyes: Negative.  Negative for blurred vision.  Gastrointestinal: Negative.   Neurological: Negative for headaches.  All other systems reviewed and are negative.  History    Vitals:   08/24/18 1303  BP: 132/89  Pulse: 86  Resp: 18  Temp: 98.5 F (36.9 C)  TempSrc: Oral  SpO2: 100%   No results found for this or any previous visit (from the past 24 hour(s)).   Exam Physical Exam  Vitals reviewed. Constitutional: She is oriented to person, place, and time. She appears well-developed and well-nourished.  HENT:  Head: Normocephalic and atraumatic.  Eyes: Pupils are equal, round, and reactive to light.  Neck: Neck supple.  Cardiovascular: Normal rate, regular rhythm and normal heart sounds.  Respiratory: Effort normal and breath sounds normal.  GI: Soft. Bowel sounds are normal. There is no tenderness.  Genitourinary:  Genitourinary Comments: Uterus: gravida equal to dates. Soft Non-tender.   Musculoskeletal: Normal range of motion.  Neurological: She is  alert and oriented to person, place, and time.  2+ Patellar DTR, No clonus.   Skin: Skin is warm and dry.  Psychiatric: She has a normal mood and affect. Her behavior is normal. Judgment and thought content normal.    Prenatal labs: ABO, Rh: --/--/A POS, A POS Performed at [facility], [address] [place], [place] [address]  [phone] Antibody: NEG (10/16 2156)Negative Rubella:  Immune RPR:   NR HBsAg:   NR HIV:   NR GBS:   Pending   [place] Mfm Fetal Bpp W/nonstress  Result Date:  08/24/2018 ----------------------------------------------------------------------  OBSTETRICS REPORT                       (Signed Final 08/24/2018 12:40 pm) ---------------------------------------------------------------------- Patient Info  ID #:       [id number]                          D.O.B.:  1976/08/01 (41 yrs)  Name:       Miranda Shah                    Visit Date: 08/24/2018 10:49 am ---------------------------------------------------------------------- Performed By  Performed By:     Magnus Ivan           Ref. Address:     [facility]                    RDMS, RVT                                                             OB[facility]Gyn Clinic                                                             [address]                                                             [place], [place]                                                             [address]  Attending:        Noralee Space MD        Location:         [facility]  Referred By:      [facility] for                    [facility]                    Healthcare ---------------------------------------------------------------------- Orders   #  Description                          Code         Ordered By   1  [place] MFM FETAL BPP                     16109.6      [facility]      W/NONSTRESS  ----------------------------------------------------------------------   #  Order #                    Accession #                 Episode #   1  [id number]                  [id number]                  [id number]  ---------------------------------------------------------------------- Indications   Hypertension - Gestational                     O84.9   Advanced maternal age multigravida 49+,        O59.523   third trimester   Medical complication of pregnancy (elevated    O26.90   LFT's)   Oligohydramnios / Decreased amniotic fluid     O41.00X0   volume   Abnormal finding on  antenatal screening        O28.9   [redacted] weeks gestation of pregnancy                Z3A.33  ---------------------------------------------------------------------- Vital Signs                                                 Height:        5'3" ---------------------------------------------------------------------- Fetal Evaluation  Num Of Fetuses:         1  Fetal Heart Rate(bpm):  142  Cardiac Activity:       Observed  Presentation:           Breech  Placenta:               Anterior  Amniotic Fluid  AFI FV:      Oligohydramnios  AFI Sum(cm)     %Tile       Largest Pocket(cm)  3.82            < 3         2.71  RUQ(cm)       RLQ(cm)       LUQ(cm)        LLQ(cm)  0.34          0             2.71           0.77 ---------------------------------------------------------------------- Biophysical Evaluation  Amniotic F.V:  Pocket => 2 cm two         F. Tone:        Observed                  planes  F. Movement:    Observed                   N.S.T:          Reactive  F. Breathing:   Observed                   Score:          10/10 ---------------------------------------------------------------------- OB History  Gravidity:    2  TOP:          1        Living:  0 ---------------------------------------------------------------------- Gestational Age  Best:          33w 5d     Det. By:  Previous Ultrasound      EDD:   10/07/18                                      (03/20/18) ---------------------------------------------------------------------- Anatomy  Stomach:               Appears normal, left   Bladder:                Appears normal                         sided ---------------------------------------------------------------------- Impression  Ms. Lard has gestational hypertension and oligohydramnios.  She returned for antenatal testing. Patient reports good fetal  movements.  On ultrasound, oligohydramnios (AFI=4 cm) was seen (2 cm  x 2 cm pocket was seen). Breech presentation. Antenatal  testing is reassuring. NST is  reactive. BPP 10/10.  I discussed with Dr. Estanislado Pandy and we made a decision to admit  her. I explained the rationale behind the decision (close  monitoring). Patient had received betamethasone on 10/15  and 08/16/18.  Her blood pressure at our office was 126/73 mm Hg. ---------------------------------------------------------------------- Recommendations  -Admit to High-Risk.  -Daily NST.  -Repeat BPP on Monday (08/28/18).  -In the absence of fetal growth restriction and provided  antenatal testing remains reassuring, I recommend delivery  at 36 weeks if oligohydramnios persists. ----------------------------------------------------------------------                  Noralee Space, MD Electronically Signed Final Report   08/24/2018 12:40 pm ----------------------------------------------------------------------  [place] Mfm Fetal Bpp Wo Non Stress  Result Date: 08/22/2018 ----------------------------------------------------------------------  OBSTETRICS REPORT                       (Signed Final 08/22/2018 11:52 am) ---------------------------------------------------------------------- Patient Info  ID #:       [id number]                          D.O.B.:  1976-03-10 (41 yrs)  Name:       Miranda Bong Myung                    Visit Date: 08/22/2018 10:05 am ---------------------------------------------------------------------- Performed By  Performed By:     Lenise Arena        Referred By:      Doris Cheadle  RDMS                                     JACOBSON  Attending:        Noralee Space MD        Location:         [facility] ---------------------------------------------------------------------- Orders   #  Description                          Code         Ordered By   1  [place] MFM FETAL BPP WO NON              76819.01     Adele Dan      STRESS  ----------------------------------------------------------------------   #  Order #                    Accession #                 Episode #   1  [id number]                   [id number]                  [id number]  ---------------------------------------------------------------------- Indications   Hypertension - Gestational                     O19.9   Advanced maternal age multigravida 27+,        O49.523   third trimester   Medical complication of pregnancy (elevated    O26.90   LFT's)   [redacted] weeks gestation of pregnancy                Z3A.33  ---------------------------------------------------------------------- Vital Signs  Weight (lb): 221                               Height:        5'3"  BMI:         39.14 ---------------------------------------------------------------------- Fetal Evaluation  Num Of Fetuses:         1  Fetal Heart Rate(bpm):  153  Cardiac Activity:       Observed  Presentation:           Breech  Placenta:               Anterior  Amniotic Fluid  AFI FV:      Oligohydramnios  AFI Sum(cm)     %Tile       Largest Pocket(cm)  4.91            < 3         2.88  RUQ(cm)       RLQ(cm)       LUQ(cm)        LLQ(cm)  0             0             2.03           2.88 ---------------------------------------------------------------------- Biophysical Evaluation  Amniotic F.V:   Pocket => 2 cm two         F. Tone:        Observed  planes  F. Movement:    Observed                   Score:          8/8  F. Breathing:   Observed ---------------------------------------------------------------------- OB History  Gravidity:    2  TOP:          1        Living:  0 ---------------------------------------------------------------------- Gestational Age  Best:          33w 3d     Det. By:  Previous Ultrasound      EDD:   10/07/18                                      (03/20/18) ---------------------------------------------------------------------- Anatomy  Thoracic:              Appears normal         Kidneys:                Appear normal  Stomach:               Appears normal, left   Bladder:                Appears normal                         sided  Abdomen:                Appears normal ---------------------------------------------------------------------- Cervix Uterus Adnexa  Cervix  Not visualized (advanced GA >24wks) ---------------------------------------------------------------------- Impression  Gestational hypertension. Patient was recently discharged.  She does not have symptoms of severe features of  preeclampsia and reports good fetal movements.  On ultrasound, amniotic fluid is decreased (AFI 4 to 5 cm).  MVP was greater than 2 cm. Breech presentation. Antenatal  testing is reassuring. Umbilical artery Doppler study is normal.  Patient reports she had normal NST at your office today.  I explained the significance of decreased amniotic fluid. I  reassured her that perinatal outcomes are good with a MVP  greater than 2 cm.  BP at our office: 137/79 mm Hg. ---------------------------------------------------------------------- Recommendations  -An appointment was made for her to return in 2 days for  repeat assessment of amniotic fluid and BPP. ----------------------------------------------------------------------                  Noralee Space, MD Electronically Signed Final Report   08/22/2018 11:52 am ----------------------------------------------------------------------  [place] Mfm Fetal Bpp Wo Non Stress  Result Date: 08/15/2018 ----------------------------------------------------------------------  OBSTETRICS REPORT                       (Signed Final 08/15/2018 04:57 pm) ---------------------------------------------------------------------- Patient Info  ID #:       [id number]                          D.O.B.:  1976/05/02 (41 yrs)  Name:       Bishop Limbo E Smalling                    Visit Date: 08/15/2018 03:08 pm ---------------------------------------------------------------------- Performed By  Performed By:     Oneida Arenas Small        Referred By:      Doris Cheadle  RDMS                                     Perry Mount  Attending:        Patsi Sears       Location:         [facility]                    MD ---------------------------------------------------------------------- Orders   #  Description                          Code         Ordered By   1  [place] MFM FETAL BPP WO NON              76819.01     Adele Dan      STRESS   2  [place] MFM OB DETAIL +14 WK              76811.01     Adele Dan  ----------------------------------------------------------------------   #  Order #                    Accession #                 Episode #   1  [id number]                   [id number]                  [id number]   2  [id number]                   [id number]                  [id number]  ---------------------------------------------------------------------- Indications   [redacted] weeks gestation of pregnancy                Z3A.32   Encounter for antenatal screening for          Z36.3   malformations   Hypertension - Gestational                     O12.9   Advanced maternal age multigravida 29+,        O50.523   third trimester   Medical complication of pregnancy (elevated    O26.90   LFT's)  ---------------------------------------------------------------------- Vital Signs                                                 Height:        5'3" ---------------------------------------------------------------------- Fetal Evaluation  Num Of Fetuses:         1  Fetal Heart Rate(bpm):  142  Cardiac Activity:       Observed  Presentation:           Breech  Placenta:               Anterior  P. Cord Insertion:      Visualized  Amniotic Fluid  AFI FV:      Within normal limits  AFI Sum(cm)     %Tile       Largest Pocket(cm)  11.81  31          4.37  RUQ(cm)                     LUQ(cm)        LLQ(cm)  4.37                        3.82           3.62 ---------------------------------------------------------------------- Biophysical Evaluation  Amniotic F.V:   Pocket => 2 cm two         F. Tone:        Observed                  planes  F. Movement:    Observed                   Score:           8/8  F. Breathing:   Observed ---------------------------------------------------------------------- Biometry  BPD:      79.6  mm     G. Age:  32w 0d         28  %    CI:        72.86   %    70 - 86                                                          FL/HC:      19.6   %    19.1 - 21.3  HC:      296.5  mm     G. Age:  32w 6d         23  %    HC/AC:      1.09        0.96 - 1.17  AC:      271.9  mm     G. Age:  31w 2d         19  %    FL/BPD:     73.1   %    71 - 87  FL:       58.2  mm     G. Age:  30w 3d          4  %    FL/AC:      21.4   %    20 - 24  HUM:      51.5  mm     G. Age:  30w 1d          6  %  Est. FW:    1722  gm    3 lb 13 oz      33  % ---------------------------------------------------------------------- OB History  Gravidity:    2  TOP:          1        Living:  0 ---------------------------------------------------------------------- Gestational Age  U/S Today:     31w 5d                                        EDD:   10/12/18  Best:          Armida Sans 3d  Det. By:  Previous Ultrasound      EDD:   10/07/18                                      (03/20/18) ---------------------------------------------------------------------- Anatomy  Cranium:               Appears normal         Aortic Arch:            Appears normal  Cavum:                 Not well visualized    Ductal Arch:            Not well visualized  Ventricles:            Previously seen        Diaphragm:              Previously seen  Choroid Plexus:        Not well visualized    Stomach:                Appears normal, left                                                                        sided  Cerebellum:            Not well visualized    Abdomen:                Appears normal  Posterior Fossa:       Not well visualized    Abdominal Wall:         Not well visualized  Nuchal Fold:           Not applicable (>20    Cord Vessels:           Appears normal ([redacted]                         wks GA)                                         vessel cord)  Face:                  Not well visualized    Kidneys:                Appear normal  Lips:                  Not well visualized    Bladder:                Appears normal  Thoracic:              Appears normal         Spine:                  Not well visualized  Heart:                 Not well visualized  Upper Extremities:      Only LUS vis  RVOT:                  Not well visualized    Lower Extremities:      LLE vis, Rt femur vis  LVOT:                  Not well visualized  Other:  Technically difficult due to advanced GA and fetal position. ---------------------------------------------------------------------- Cervix Uterus Adnexa  Cervix  Not visualized (advanced GA >24wks)  Uterus  No abnormality visualized.  Left Ovary  Not visualized.  Right Ovary  Not visualized.  Adnexa  No abnormality visualized. No adnexal mass  visualized. ---------------------------------------------------------------------- Comments  U/S images reviewed. Appropriate fetal growth is noted.  No  fetal abnormalities are seen.  No evidence of fetal  compromise is found on BPP today.  BP - 129/82.  Patient denies headaches, visual disturbance,  mid-epigastric or RUQ pain.  Questions answered.  10 minutes spent face to face with patient.  Recommendations: 1) Serial U/S every 4 weeks for fetal  growth 2) Twice weekly BP check and A-P surveillance (once  with OB and once with MFM) 3) Weekly PIH labs to be done  by OB 4) Possible delivery @ 34 weeks ---------------------------------------------------------------------- Recommendations   1) Serial U/S every 4 weeks for fetal growth 2) Twice weekly  BP check and A-P surveillance (once with OB and once with  MFM) 3) Weekly PIH labs to be done by OB 4) Possible  delivery @ 34 weeks ----------------------------------------------------------------------               Patsi Sears, MD Electronically Signed Final Report   08/15/2018 04:57 pm  ----------------------------------------------------------------------  [place] Mfm Fetal Bpp Wo Non Stress  Result Date: 08/08/2018 ----------------------------------------------------------------------  OBSTETRICS REPORT                    (Corrected Final 08/08/2018 02:54 pm) ---------------------------------------------------------------------- Patient Info  ID #:       [id number]                          D.O.B.:  11/22/1975 (41 yrs)  Name:       Miranda Bong Kiger                    Visit Date: 08/08/2018 01:28 pm ---------------------------------------------------------------------- Performed By  Performed By:     Lenise Arena        Referred By:      Pryor Ochoa  Attending:        MFM Provider           Location:         [facility] ---------------------------------------------------------------------- Orders   #  Description                          Code         Ordered By   1  [place] MFM FETAL BPP WO NON  16109.60     Adele Dan      STRESS  ----------------------------------------------------------------------   #  Order #                    Accession #                 Episode #   1  [id number]                   [id number]                  [id number]  ---------------------------------------------------------------------- Indications   [redacted] weeks gestation of pregnancy                Z3A.31   Hypertension - Gestational                     O16.9   Advanced maternal age multigravida 1+,        O41.523   third trimester   Medical complication of pregnancy (elevated    O26.90   LFT's)  ---------------------------------------------------------------------- Vital Signs  Weight (lb): 216                               Height:        5'3"  BMI:         38.26 ---------------------------------------------------------------------- Fetal Evaluation  Num Of Fetuses:         1  Fetal Heart Rate(bpm):  149  Cardiac Activity:       Observed   Presentation:           Breech  Placenta:               Anterior  Amniotic Fluid  AFI FV:      Within normal limits  AFI Sum(cm)     %Tile       Largest Pocket(cm)  8.53            5           3.49  RUQ(cm)       RLQ(cm)       LUQ(cm)        LLQ(cm)  2.23          3.49          2.81           0 ---------------------------------------------------------------------- Biophysical Evaluation  Amniotic F.V:   Within normal limits       F. Tone:        Observed  F. Movement:    Observed                   Score:          8/8  F. Breathing:   Observed ---------------------------------------------------------------------- Biometry  LV:          8  mm ---------------------------------------------------------------------- OB History  Gravidity:    2  TOP:          1        Living:  0 ---------------------------------------------------------------------- Gestational Age  Best:          31w 3d     Det. By:  Previous Ultrasound      EDD:   10/07/18                                      (  03/20/18) ---------------------------------------------------------------------- Anatomy  Ventricles:            Appears normal         Abdomen:                Appears normal  Thoracic:              Appears normal         Kidneys:                Appear normal  Diaphragm:             Appears normal         Bladder:                Appears normal  Stomach:               Appears normal, left                         sided ---------------------------------------------------------------------- Cervix Uterus Adnexa  Cervix  Not visualized (advanced GA >24wks) ---------------------------------------------------------------------- Comments  U/S images reviewed. Findings reviewed with patient.   No  evidence of fetal compromise is found on BPP today.  No  fetal abnormalities are seen.  Patient was observed in the  MAU earlier today secondary to a BP of 158/110.  She  reports at least one additional BP elevation in office.  This  now raises the concern for her  elevated LFT's possibly being  representative of atypical severe pre-eclampsia  BP - 135/81.  Patient denies headaches, visual disturbance,  mid-epigastric or RUQ pain.  08/08/18 - AST/ALT - 143/248 ;  H/H - 10.6/31.6 317K; UA - negative for protein;  Protein/Creatinine - low (unable to calculate).  General counseling regarding pre-eclampsia/PIH/Gestational  Hypertension was performed.  A description of the criteria for  severe pre-eclampsia was given along with its management  at this gestational age.  The role of Magnesium sulfate in  seizure prophylaxis was outlined.  Patients with mild pre-eclampsia/PIH/Gestational  Hypertension should be delivered at 37 weeks based on the  HYPITAT multicenter trial.  This trial showed that pre-  eclamptic women benefited from early intervention, without  incurring an increased risk of operative delivery or neonatal  morbidity. The trial was not large enough to determine  whether small differences in newborn outcomes or induction  between 36 and 37 weeks might be statistically significant. A  follow-up economic analysis of this trial concluded induction  was also less costly overall than expectant management with  monitoring.  MgSO4 if given, should be initiated as a 4-6 gram bolus over  20 (4 gram bolus) to 30 minutes (6 gram bolus) (a faster  therapeutic level will be obtained if a 6 gram bolus is utilized)  followed by 2-2  grams per hour continuous infusion.  Therapeutic Magnesium levels are 5 to 8 mg percent.  If  Magnesium levels are sub-therapeutic, a rebolus of 2 grams  over 10 minutes will often correct the Magnesium level.  If  levels are on the lower end of the therapeutic range,  increasing the hourly rate by  gram per hour often suffices.  Magnesium levels may be checked every 6 hours if  necessary.  In order to acutely lower BP, Hydralazine IV or Labetalol IV  may be used.  Hydralazine may be given as 5-10 mg IV  increments every 15 - 20 minutes, if BP is not  significantly  reduced, 20 mg IV  Labetalol should be used or alternatively  started with.  IV Labetalol may be given as 20 mg then 40 mg  then 80 mg every 10 -15 minutes (80 mg being the largest  individual dose) up to a total IV dose of 300 mg.  If necessary  a continuous IV Labetalol drip may be utilized.  Immediate release oral Nifedipine 10 mg PO followed by 20  mg every 20 minutes may also be utilized as a first line  treatment to maintain Systolic BP < 160 and/or diastolic BP <  110.  Maximum daily dose is 180 mg.  If after 50 mg PO  immediate release Nifedipine has been given and BP  remains greater than 160 for systolic BP or 110 for diastolic,  Labetalol or Hydralazine should be utilized.   Questions answered.  25 minutes spent face to face with patient.  Recommendations: 1) Twice weekly A-P surveillance with BP  check (once with MFM and once with OB) 2) Weekly PIH labs  to be done by OB 3) Serial U/S every 4 weeks for fetal growth  4) To MAU for outpatient Celestone (first injection 12 mg  today, second injection 12 mg tomorrow (24 hours after first  injection) ---------------------------------------------------------------------- Recommendations   1) Twice weekly A-P surveillance with BP check (once with  MFM and once with OB) 2) Weekly PIH labs to be done by  OB 3) Serial U/S every 4 weeks for fetal growth 4) To MAU  for outpatient Celestone (first injection 12 mg today, second  injection 12 mg tomorrow (24 hours after first injection) 5)  Maternal CMV and Epstein-Barr Virus testing (to complete  elevated LFT evaluation) to be done by OB ----------------------------------------------------------------------                    Patsi Sears, MD Electronically Signed Corrected Final Report  08/08/2018 02:54 pm ----------------------------------------------------------------------  [place] Mfm Ob Detail +14 Wk  Result Date:  08/15/2018 ----------------------------------------------------------------------  OBSTETRICS REPORT                       (Signed Final 08/15/2018 04:57 pm) ---------------------------------------------------------------------- Patient Info  ID #:       [id number]                          D.O.B.:  [birth year] (41 yrs)  Name:       Miranda Bong Dascoli                    Visit Date: 08/15/2018 03:08 pm ---------------------------------------------------------------------- Performed By  Performed By:     Vivien Rota        Referred By:      Doris Cheadle                    RDMS                                     JACOBSON  Attending:        Patsi Sears      Location:         [facility]                    MD ---------------------------------------------------------------------- Orders   #  Description  Code         Ordered By   1  [place] MFM FETAL BPP WO NON              [id number]     Adele Dan      STRESS   2  [place] MFM OB DETAIL +14 WK              76811.01     Adele Dan  ----------------------------------------------------------------------   #  Order #                    Accession #                 Episode #   1  [id number]                   [id number]   2  [id number]                   [id number]  ---------------------------------------------------------------------- Indications   [redacted] weeks gestation of pregnancy                Z3A.32   Encounter for antenatal screening for          Z36.3   malformations   Hypertension - Gestational                     O42.9   Advanced maternal age multigravida 33+,        O12.523   third trimester   Medical complication of pregnancy (elevated    O26.90   LFT's)  ---------------------------------------------------------------------- Vital Signs                                                 Height:        5'3" ---------------------------------------------------------------------- Fetal Evaluation  Num Of  Fetuses:         1  Fetal Heart Rate(bpm):  142  Cardiac Activity:       Observed  Presentation:           Breech  Placenta:               Anterior  P. Cord Insertion:      Visualized  Amniotic Fluid  AFI FV:      Within normal limits  AFI Sum(cm)     %Tile       Largest Pocket(cm)  11.81           31          4.37  RUQ(cm)                     LUQ(cm)        LLQ(cm)  4.37                        3.82           3.62 ---------------------------------------------------------------------- Biophysical Evaluation  Amniotic F.V:   Pocket => 2 cm two         F. Tone:        Observed  planes  F. Movement:    Observed                   Score:          8/8  F. Breathing:   Observed ---------------------------------------------------------------------- Biometry  BPD:      79.6  mm     G. Age:  32w 0d         28  %    CI:        72.86   %    70 - 86                                                          FL/HC:      19.6   %    19.1 - 21.3  HC:      296.5  mm     G. Age:  32w 6d         23  %    HC/AC:      1.09        0.96 - 1.17  AC:      271.9  mm     G. Age:  31w 2d         19  %    FL/BPD:     73.1   %    71 - 87  FL:       58.2  mm     G. Age:  30w 3d          4  %    FL/AC:      21.4   %    20 - 24  HUM:      51.5  mm     G. Age:  30w 1d          6  %  Est. FW:    1722  gm    3 lb 13 oz      33  % ---------------------------------------------------------------------- OB History  Gravidity:    2  TOP:          1        Living:  0 ---------------------------------------------------------------------- Gestational Age  U/S Today:     31w 5d                                        EDD:   10/12/18  Best:          Armida Sans 3d     Det. By:  Previous Ultrasound      EDD:   10/07/18                                      (03/20/18) ---------------------------------------------------------------------- Anatomy  Cranium:               Appears normal         Aortic Arch:            Appears normal  Cavum:                 Not  well visualized    Ductal Arch:  Not well visualized  Ventricles:            Previously seen        Diaphragm:              Previously seen  Choroid Plexus:        Not well visualized    Stomach:                Appears normal, left                                                                        sided  Cerebellum:            Not well visualized    Abdomen:                Appears normal  Posterior Fossa:       Not well visualized    Abdominal Wall:         Not well visualized  Nuchal Fold:           Not applicable (>20    Cord Vessels:           Appears normal ([redacted]                         wks GA)                                        vessel cord)  Face:                  Not well visualized    Kidneys:                Appear normal  Lips:                  Not well visualized    Bladder:                Appears normal  Thoracic:              Appears normal         Spine:                  Not well visualized  Heart:                 Not well visualized    Upper Extremities:      Only LUS vis  RVOT:                  Not well visualized    Lower Extremities:      LLE vis, Rt femur vis  LVOT:                  Not well visualized  Other:  Technically difficult due to advanced GA and fetal position. ---------------------------------------------------------------------- Cervix Uterus Adnexa  Cervix  Not visualized (advanced GA >24wks)  Uterus  No abnormality visualized.  Left Ovary  Not visualized.  Right Ovary  Not visualized.  Adnexa  No abnormality visualized. No adnexal mass  visualized. ---------------------------------------------------------------------- Comments  U/S images reviewed. Appropriate fetal  growth is noted.  No  fetal abnormalities are seen.  No evidence of fetal  compromise is found on BPP today.  BP - 129/82.  Patient denies headaches, visual disturbance,  mid-epigastric or RUQ pain.  Questions answered.  10 minutes spent face to face with patient.  Recommendations: 1) Serial U/S every 4 weeks  for fetal  growth 2) Twice weekly BP check and A-P surveillance (once  with OB and once with MFM) 3) Weekly PIH labs to be done  by OB 4) Possible delivery @ 34 weeks ---------------------------------------------------------------------- Recommendations   1) Serial U/S every 4 weeks for fetal growth 2) Twice weekly  BP check and A-P surveillance (once with OB and once with  MFM) 3) Weekly PIH labs to be done by OB 4) Possible  delivery @ 34 weeks ----------------------------------------------------------------------               Patsi Sears, MD Electronically Signed Final Report   08/15/2018 04:57 pm ----------------------------------------------------------------------  US Abdomen Limited Ruq  Result Date: 08/18/2018 CLINICAL DATA:  42 year old female with elevated liver enzymes. EXAM: ULTRASOUND ABDOMEN LIMITED RIGHT UPPER QUADRANT COMPARISON:  None. FINDINGS: Gallbladder: Small mobile echogenic foci and debris layer within the gallbladder lumen consistent with sludge and small stones. Additionally, there is prominent ring down artifact arising from the gallbladder wall consistent with adenomyomatosis. The gallbladder wall is not thickened. Common bile duct: Diameter: Within normal limits at 4 mm Liver: No focal lesion identified. Within normal limits in parenchymal echogenicity. Portal vein is patent on color Doppler imaging with normal direction of blood flow towards the liver. IMPRESSION: 1. Cholelithiasis without sonographic features of acute cholecystitis or biliary ductal dilatation. 2. Mild adenomyomatosis of the gallbladder wall. 3. Normal sonographic appearance of the liver. Electronically Signed   By: Malachy Moan M.D.   On: 08/18/2018 08:43    Assessment/Plan: 42 y.o. G2P0 at [redacted]w[redacted]d admitted for oligohydramnios per MFM and Dr Estanislado Pandy. GBS- on 10/18. BMZ X2 on 10/15 & 10/16. Reactive NST today with MFM. Pt stable. BP 132/89, no s/sx of preeclampsia. Anterior placenta with breech  presentation on [place] today.   Oligohydramnios: AFI 4, will repeat US BPP on Monday. Daily NST. Bed rest with bathroom privileges.   Gestational hypertension: Normotensive now, stable, will monitor BP, labetalol protocol PRN. Will draw baseline labs for Preeclampsia. Plan for induction at 36 weeks, unless pt or fetus deteriorates.   Gallstones: elevated liver function tests and without proteinuria x1 week ago was discharged in stable condition to be followed out pt. Will redraw LFT today.   VTE Prophylactics: Pt to wear SCD.   Kaeden Mester, CNM, FNP 08/24/2018, 1:25 PM

## 2018-08-24 NOTE — MAU Note (Signed)
Pt being seen in [facility] today.  Report given to Eunice Blase, RN and admit room 305 per Dr. Drusilla Kanner orders.  Pt ambulated to admitting for further evaluation.

## 2018-08-24 NOTE — Procedures (Signed)
Miranda Shah 05/20/1976 110w5d  Fetus A Non-Stress Test Interpretation for 08/24/18  Indication: Oligohydramnios  Fetal Heart Rate A Mode: External Baseline Rate (A): 140 bpm Variability: Moderate Accelerations: 10 x 10, 15 x 15 Decelerations: None Multiple birth?: No  Uterine Activity Mode: Palpation, Toco Contraction Frequency (min): U/I Contraction Duration (sec): 40 Contraction Quality: Mild(none felt by pt) Resting Tone Palpated: Relaxed Resting Time: Adequate  Interpretation (Fetal Testing) Nonstress Test Interpretation: Reactive Comments: Reviewed tracing with Dr. Judeth Cornfield

## 2018-08-25 DIAGNOSIS — K8020 Calculus of gallbladder without cholecystitis without obstruction: Secondary | ICD-10-CM | POA: Insufficient documentation

## 2018-08-25 NOTE — Plan of Care (Signed)
  Problem: Pain Managment: Goal: General experience of comfort will improve Outcome: Progressing   Problem: Safety: Goal: Ability to remain free from injury will improve Outcome: Progressing   Problem: Pain Management: Goal: Relief or control of pain will improve Outcome: Progressing   

## 2018-08-26 ENCOUNTER — Inpatient Hospital Stay (HOSPITAL_BASED_OUTPATIENT_CLINIC_OR_DEPARTMENT_OTHER): Payer: 59

## 2018-08-26 ENCOUNTER — Encounter (HOSPITAL_COMMUNITY): Payer: Self-pay | Admitting: *Deleted

## 2018-08-26 DIAGNOSIS — Z3A34 34 weeks gestation of pregnancy: Secondary | ICD-10-CM

## 2018-08-26 DIAGNOSIS — O4103X0 Oligohydramnios, third trimester, not applicable or unspecified: Secondary | ICD-10-CM

## 2018-08-26 DIAGNOSIS — O2693 Pregnancy related conditions, unspecified, third trimester: Secondary | ICD-10-CM

## 2018-08-26 DIAGNOSIS — O09523 Supervision of elderly multigravida, third trimester: Secondary | ICD-10-CM

## 2018-08-26 NOTE — Progress Notes (Signed)
Hospital day # 2 pregnancy at [redacted]w[redacted]d--oligohydramnios.   S:  AFI was noted to be 4 at MFM.  Pt history of gallstones with increased liver enzymes.  Liver enzymes have normalized.  Denies leaking of fluids or vaginal bleeding.  FM+.  Denies headache.  Betamethasone complete 10/16.  GBS negative on 08/18/2018.Marland Kitchen        Perception of contractions: none      Vaginal bleeding: None       Vaginal discharge:  no significant change  O: BP (!) 147/75 (BP Location: Left Arm)   Pulse 82   Temp 98.7 F (37.1 C) (Oral)   Resp 18   Ht 5\' 3"  (1.6 m)   Wt 100.7 kg   LMP 12/26/2017   SpO2 97%   BMI 39.33 kg/m       Fetal tracings:FHT 130 accels, no decels      Contractions:   None      Uterus gravid      Extremities: extremities normal, atraumatic, no cyanosis or edema and no significant edema and no signs of DVT          Labs: 08/24/2018 AST 33, ALT  67, u;ric acid 5.9,, creat .71, HH 10.2/30.2 plts 313      Meds: See MAR  A: [redacted]w[redacted]d with oligohydramnios, gallstones     Stable Cat 1  P: Continue current plan of care      Upcoming tests/treatments:       MDs will follow  Kenney Houseman CNM, MSN 08/26/2018 10:06 AM

## 2018-08-27 DIAGNOSIS — O4100X0 Oligohydramnios, unspecified trimester, not applicable or unspecified: Secondary | ICD-10-CM | POA: Diagnosis present

## 2018-08-27 LAB — TYPE AND SCREEN
ABO/RH(D): A POS
Antibody Screen: NEGATIVE

## 2018-08-27 NOTE — Progress Notes (Signed)
Shah, Miranda Female, 42 y.o., 06-05-1976  Subjective: Patient reports feeling her underwear getting damp occasionally. She denies contractions or vaginal bleeding.  She reports normal fetal movements.  She denies headaches/blurry vision/chest pain/shortness of breath/nausea/vomiting/abdominal pain.     Objective: I have reviewed patient's vital signs, medications and radiology results. Vitals:   08/27/18 0022 08/27/18 0556 08/27/18 0844 08/27/18 1304  BP: 134/66 129/74 121/65 128/73  Pulse: 76 81 79 67  Resp: 17 17 18 18   Temp: 98.2 F (36.8 C) 98.4 F (36.9 C) 98.8 F (37.1 C) 98.5 F (36.9 C)  TempSrc: Oral Oral Oral Oral  SpO2: 97% 96% 95% 97%  Weight:      Height:        General: alert, cooperative and no distress Resp: clear to auscultation bilaterally Cardio: regular rate and rhythm, S1, S2 normal, no murmur, click, rub or gallop GI: soft, non-tender; bowel sounds normal; no masses,  no organomegaly Extremities: no edema, redness or tenderness in the calves or thighs Vaginal Bleeding: none Speculum exam: Small white discharge, no odor.  No pooling in vaginal vault.  No leakage of fluid with Valsalva.  Fern test: negative.    08/27/2018 at 1012 am:  NST: Baseline 150, moderate variability, Reactive.  Category 1.  TOCO: No contractions.    OB Ultrasound 08/26/2018: AFI 8.3.  Breech presentation.  CBC    Component Value Date/Time   WBC 13.0 (H) 08/24/2018 1325   RBC 3.49 (L) 08/24/2018 1325   HGB 10.2 (L) 08/24/2018 1325   HCT 30.2 (L) 08/24/2018 1325   PLT 313 08/24/2018 1325   MCV 86.5 08/24/2018 1325   MCH 29.2 08/24/2018 1325   MCHC 33.8 08/24/2018 1325   RDW 14.1 08/24/2018 1325   LYMPHSABS 2.0 08/17/2018 0516   MONOABS 0.5 08/17/2018 0516   EOSABS 0.0 08/17/2018 0516   BASOSABS 0.0 08/17/2018 0516    CMP     Component Value Date/Time   NA 135 08/24/2018 1325   K 4.4 08/24/2018 1325   CL 104 08/24/2018 1325   CO2 21 (L) 08/24/2018 1325   GLUCOSE  91 08/24/2018 1325   BUN 8 08/24/2018 1325   CREATININE 0.71 08/24/2018 1325   CALCIUM 8.9 08/24/2018 1325   PROT 6.4 (L) 08/24/2018 1325   ALBUMIN 2.7 (L) 08/24/2018 1325   AST 33 08/24/2018 1325   ALT 67 (H) 08/24/2018 1325   ALKPHOS 108 08/24/2018 1325   BILITOT 0.4 08/24/2018 1325   GFRNONAA >60 08/24/2018 1325   GFRAA >60 08/24/2018 1325     Current Facility-Administered Medications:  .  acetaminophen (TYLENOL) tablet 650 mg, 650 mg, Oral, Q4H PRN, [place], Jade, FNP .  calcium carbonate (TUMS - dosed in mg elemental calcium) chewable tablet 400 mg of elemental calcium, 2 tablet, Oral, Q4H PRN, [place], Jade, FNP, 400 mg of elemental calcium at 08/24/18 2234 .  docusate sodium (COLACE) capsule 100 mg, 100 mg, Oral, Daily, [place], [place], FNP, 100 mg at 08/27/18 1008 .  ferrous sulfate tablet 325 mg, 325 mg, Oral, BID WC, [place], [place], FNP, 325 mg at 08/27/18 1008 .  labetalol (NORMODYNE,TRANDATE) injection 20 mg, 20 mg, Intravenous, PRN **AND** labetalol (NORMODYNE,TRANDATE) injection 40 mg, 40 mg, Intravenous, PRN **AND** labetalol (NORMODYNE,TRANDATE) injection 80 mg, 80 mg, Intravenous, PRN **AND** hydrALAZINE (APRESOLINE) injection 10 mg, 10 mg, Intravenous, PRN **AND** Measure blood pressure, , , Once, [place], [place], FNP .  lactated ringers infusion, , Intravenous, Continuous, [place], [place], [place], Stopped at 08/25/18 [phone] .  prenatal multivitamin tablet 1 tablet, 1 tablet, Oral, Q1200, [place], [place], [place], 1 tablet at 08/27/18 1008 .  zolpidem (AMBIEN) tablet 5 mg, 5 mg, Oral, QHS PRN, [employer], FNP  Assessment/Plan: 42 yo [id number] at 34 weeks 1 day EGA admitted for oligohydramnios (AFI was 4cm).  Also with a history of elevated LFTs (have been down trending) and gallstones being managed expectantly, -Rule out for rupture of membranes today. -ROM precautions and Kegels exercises discussed.  I suspect that the underwear dampness is from urine loss. -Repeat AFI check tomorrow.   -NST Q daily.   LOS: 3 days  [facility] . MD.  08/27/2018, 1:54 PM

## 2018-08-28 ENCOUNTER — Other Ambulatory Visit: Payer: 59

## 2018-08-28 NOTE — Discharge Summary (Signed)
ANTENATAL DISCHARGE SUMMARY  Patient ID: MAECIE SEVCIK MRN: [id number] DOB/AGE: Mar 04, 1976 42 y.o.  Admit date: 08/24/2018 Discharge date: 08/28/2018  Admission Diagnoses: 33wks oligo  Discharge Diagnoses: 33wks oligo         Discharged Condition: good. BP are still labile 130-149/8090  Hospital Course: 48 hours after admission (08/26/18) ultrasound with AFI 8.5 and BPP 8/8  Consults: MFM Dr Noralee Space  Treatments: IV hydration  Disposition: home   Follow-up: 08/29/18 at [facility] with AFI. 2x weekly at [facility] and 1x weekly at [facility]FM  Allergies as of 08/28/2018      Reactions   Pecan Extract Allergy Skin Test Itching   Pecan and walnuts makes Mouth itch   Pollen Extract Itching   Itching, red eyes. sneezing      Medication List    TAKE these medications   calcium carbonate 500 MG chewable tablet Commonly known as:  TUMS - dosed in mg elemental calcium Chew 1-3 tablets by mouth daily as needed for indigestion or heartburn (depends on indigestion if takes 1-3 tablets).   FUSION PLUS Caps Take 1 capsule by mouth daily.   NATACHEW 28-1 MG Chew Chew 1 each by mouth at bedtime.        Signed: Esmeralda Arthur, MD MD 08/28/2018, 5:52 PM

## 2018-08-28 NOTE — Progress Notes (Signed)
Pt discharged to home with husband.  Condition stable.  Pt ambulated to car with C. Stokes, NT.  No equipment for home ordered at discharge.

## 2018-08-28 NOTE — Progress Notes (Addendum)
Hospital day # 4 pregnancy at [redacted]w[redacted]d--Admitted for oligohydramnio, AFI 4.  Breech. Hx. Elevated LFTs, likely  Due to gallstones. S/p BMZ 10/15, 10/16. GHTN.   S:  Feeling well.      Perception of contractions: none      Vaginal bleeding: none now    O: BP (!) 148/85 (BP Location: Right Arm)   Pulse 79   Temp 97.9 F (36.6 C) (Oral)   Resp 19   Ht 5\' 3"  (1.6 m)   Wt 100.7 kg   LMP 12/26/2017   SpO2 99%   BMI 39.33 kg/m    Vitals:   08/27/18 1620 08/27/18 1943 08/27/18 2330 08/28/18 0626  BP: 133/82 (!) 144/83 (!) 156/87 (!) 148/85  Pulse: 80 82 79 79  Resp: 18 19 19 19   Temp: 98.6 F (37 C) 99.3 F (37.4 C) 98.7 F (37.1 C) 97.9 F (36.6 C)  TempSrc: Oral Oral Oral Oral  SpO2: 97% 100% 97% 99%  Weight:      Height:            Fetal tracings: cat 1 and reactive      Contractions:   none      Uterus gravid and non-tender      Extremities: no significant edema and no signs of DVT          Labs:  none       Scheduled Meds: . docusate sodium  100 mg Oral Daily  . ferrous sulfate  325 mg Oral BID WC  . prenatal multivitamin  1 tablet Oral Q1200   Continuous Infusions: . lactated ringers Stopped (08/25/18 0709)   PRN Meds:.acetaminophen, calcium carbonate, labetalol **AND** labetalol **AND** labetalol **AND** hydrALAZINE **AND** Measure blood pressure, zolpidem  Has not required PRN antihypertensive meds.  A: [redacted]w[redacted]d with oligo, GHTN, breech, gallstones, LFTs normalizing     stable  P: Continue current plan of care      Upcoming tests/treatments:  NST daily, MFM u/s      MDs will follow  Nigel Bridgeman CNM, [place] 08/28/2018 7:48 AM  Seen and agreed Discussed POC with Dr Judeth Cornfield: OK to discharge home and monitor closely. Patient to keep appointment 10/29/19and 08/31/18  at [facility]. Will recheck AFI/BPP Not to treat GHTN unless >=155/105

## 2018-08-28 NOTE — Discharge Instructions (Signed)
Preeclampsia and Eclampsia °Preeclampsia is a serious condition that develops only during pregnancy. It is also called toxemia of pregnancy. This condition causes high blood pressure along with other symptoms, such as swelling and headaches. These symptoms may develop as the condition gets worse. Preeclampsia may occur at 20 weeks of pregnancy or later. °Diagnosing and treating preeclampsia early is very important. If not treated early, it can cause serious problems for you and your baby. One problem it can lead to is eclampsia, which is a condition that causes muscle jerking or shaking (convulsions or seizures) in the mother. Delivering your baby is the best treatment for preeclampsia or eclampsia. Preeclampsia and eclampsia symptoms usually go away after your baby is born. °What are the causes? °The cause of preeclampsia is not known. °What increases the risk? °The following risk factors make you more likely to develop preeclampsia: °· Being pregnant for the first time. °· Having had preeclampsia during a past pregnancy. °· Having a family history of preeclampsia. °· Having high blood pressure. °· Being pregnant with twins or triplets. °· Being 35 or older. °· Being African-American. °· Having kidney disease or diabetes. °· Having medical conditions such as lupus or blood diseases. °· Being very overweight (obese). ° °What are the signs or symptoms? °The earliest signs of preeclampsia are: °· High blood pressure. °· Increased protein in your urine. Your health care provider will check for this at every visit before you give birth (prenatal visit). ° °Other symptoms that may develop as the condition gets worse include: °· Severe headaches. °· Sudden weight gain. °· Swelling of the hands, face, legs, and feet. °· Nausea and vomiting. °· Vision problems, such as blurred or double vision. °· Numbness in the face, arms, legs, and feet. °· Urinating less than usual. °· Dizziness. °· Slurred speech. °· Abdominal pain,  especially upper abdominal pain. °· Convulsions or seizures. ° °Symptoms generally go away after giving birth. °How is this diagnosed? °There are no screening tests for preeclampsia. Your health care provider will ask you about symptoms and check for signs of preeclampsia during your prenatal visits. You may also have tests that include: °· Urine tests. °· Blood tests. °· Checking your blood pressure. °· Monitoring your baby’s heart rate. °· Ultrasound. ° °How is this treated? °You and your health care provider will determine the treatment approach that is best for you. Treatment may include: °· Having more frequent prenatal exams to check for signs of preeclampsia, if you have an increased risk for preeclampsia. °· Bed rest. °· Reducing how much salt (sodium) you eat. °· Medicine to lower your blood pressure. °· Staying in the hospital, if your condition is severe. There, treatment will focus on controlling your blood pressure and the amount of fluids in your body (fluid retention). °· You may need to take medicine (magnesium sulfate) to prevent seizures. This medicine may be given as an injection or through an IV tube. °· Delivering your baby early, if your condition gets worse. You may have your labor started with medicine (induced), or you may have a cesarean delivery. ° °Follow these instructions at home: °Eating and drinking ° °· Drink enough fluid to keep your urine clear or pale yellow. °· Eat a healthy diet that is low in sodium. Do not add salt to your food. Check nutrition labels to see how much sodium a food or beverage contains. °· Avoid caffeine. °Lifestyle °· Do not use any products that contain nicotine or tobacco, such as cigarettes   and e-cigarettes. If you need help quitting, ask your health care provider. °· Do not use alcohol or drugs. °· Avoid stress as much as possible. Rest and get plenty of sleep. °General instructions °· Take over-the-counter and prescription medicines only as told by your  health care provider. °· When lying down, lie on your side. This keeps pressure off of your baby. °· When sitting or lying down, raise (elevate) your feet. Try putting some pillows underneath your lower legs. °· Exercise regularly. Ask your health care provider what kinds of exercise are best for you. °· Keep all follow-up and prenatal visits as told by your health care provider. This is important. °How is this prevented? °To prevent preeclampsia or eclampsia from developing during another pregnancy: °· Get proper medical care during pregnancy. Your health care provider may be able to prevent preeclampsia or diagnose and treat it early. °· Your health care provider may have you take a low-dose aspirin or a calcium supplement during your next pregnancy. °· You may have tests of your blood pressure and kidney function after giving birth. °· Maintain a healthy weight. Ask your health care provider for help managing weight gain during pregnancy. °· Work with your health care provider to manage any Azucena-term (chronic) health conditions you have, such as diabetes or kidney problems. ° °Contact a health care provider if: °· You gain more weight than expected. °· You have headaches. °· You have nausea or vomiting. °· You have abdominal pain. °· You feel dizzy or light-headed. °Get help right away if: °· You develop sudden or severe swelling anywhere in your body. This usually happens in the legs. °· You gain 5 lbs (2.3 kg) or more during one week. °· You have severe: °? Abdominal pain. °? Headaches. °? Dizziness. °? Vision problems. °? Confusion. °? Nausea or vomiting. °· You have a seizure. °· You have trouble moving any part of your body. °· You develop numbness in any part of your body. °· You have trouble speaking. °· You have any abnormal bleeding. °· You pass out. °This information is not intended to replace advice given to you by your health care provider. Make sure you discuss any questions you have with your health  care provider. °Document Released: 10/15/2000 Document Revised: 06/15/2016 Document Reviewed: 05/24/2016 °Elsevier Interactive Patient Education © 2018 [employer] ° °

## 2018-09-07 ENCOUNTER — Encounter (HOSPITAL_COMMUNITY): Payer: Self-pay

## 2018-09-07 ENCOUNTER — Inpatient Hospital Stay (HOSPITAL_COMMUNITY)
Admission: AD | Admit: 2018-09-07 | Discharge: 2018-09-11 | DRG: 787 | Disposition: A | Discharge status: Home / Self Care | Payer: 59 | Attending: Obstetrics and Gynecology | Admitting: Obstetrics and Gynecology

## 2018-09-07 ENCOUNTER — Other Ambulatory Visit: Payer: Self-pay | Admitting: Obstetrics & Gynecology

## 2018-09-07 ENCOUNTER — Other Ambulatory Visit: Payer: Self-pay

## 2018-09-07 DIAGNOSIS — Z3A35 35 weeks gestation of pregnancy: Secondary | ICD-10-CM | POA: Diagnosis not present

## 2018-09-07 DIAGNOSIS — O26613 Liver and biliary tract disorders in pregnancy, third trimester: Secondary | ICD-10-CM

## 2018-09-07 DIAGNOSIS — O4103X0 Oligohydramnios, third trimester, not applicable or unspecified: Secondary | ICD-10-CM | POA: Diagnosis present

## 2018-09-07 DIAGNOSIS — O321XX0 Maternal care for breech presentation, not applicable or unspecified: Secondary | ICD-10-CM | POA: Diagnosis present

## 2018-09-07 DIAGNOSIS — E669 Obesity, unspecified: Secondary | ICD-10-CM | POA: Diagnosis present

## 2018-09-07 DIAGNOSIS — O1414 Severe pre-eclampsia complicating childbirth: Principal | ICD-10-CM | POA: Diagnosis present

## 2018-09-07 DIAGNOSIS — O165 Unspecified maternal hypertension, complicating the puerperium: Secondary | ICD-10-CM | POA: Diagnosis present

## 2018-09-07 DIAGNOSIS — Z87891 Personal history of nicotine dependence: Secondary | ICD-10-CM | POA: Diagnosis not present

## 2018-09-07 DIAGNOSIS — O1413 Severe pre-eclampsia, third trimester: Secondary | ICD-10-CM | POA: Diagnosis not present

## 2018-09-07 DIAGNOSIS — O9081 Anemia of the puerperium: Secondary | ICD-10-CM | POA: Diagnosis not present

## 2018-09-07 DIAGNOSIS — R748 Abnormal levels of other serum enzymes: Secondary | ICD-10-CM | POA: Diagnosis present

## 2018-09-07 DIAGNOSIS — O99214 Obesity complicating childbirth: Secondary | ICD-10-CM | POA: Diagnosis present

## 2018-09-07 LAB — COMPREHENSIVE METABOLIC PANEL
ALBUMIN: 3 g/dL — AB (ref 3.5–5.0)
ALT: 294 U/L — ABNORMAL HIGH (ref 0–44)
AST: 184 U/L — ABNORMAL HIGH (ref 15–41)
Alkaline Phosphatase: 187 U/L — ABNORMAL HIGH (ref 38–126)
Anion gap: 10 (ref 5–15)
BILIRUBIN TOTAL: 0.5 mg/dL (ref 0.3–1.2)
BUN: 11 mg/dL (ref 6–20)
CALCIUM: 9.7 mg/dL (ref 8.9–10.3)
CO2: 21 mmol/L — AB (ref 22–32)
Chloride: 104 mmol/L (ref 98–111)
Creatinine, Ser: 0.7 mg/dL (ref 0.44–1.00)
GFR calc Af Amer: >60 mL/min (ref >60)
GFR calc non Af Amer: >60 mL/min (ref >60)
GLUCOSE: 75 mg/dL (ref 70–99)
POTASSIUM: 4.2 mmol/L (ref 3.5–5.1)
SODIUM: 135 mmol/L (ref 135–145)
Total Protein: 7.3 g/dL (ref 6.5–8.1)

## 2018-09-07 LAB — CBC
HCT: 33.3 % — ABNORMAL LOW (ref 36.0–46.0)
Hemoglobin: 11 g/dL — ABNORMAL LOW (ref 12.0–15.0)
MCH: 29.1 pg (ref 26.0–34.0)
MCHC: 33 g/dL (ref 30.0–36.0)
MCV: 88.1 fL (ref 80.0–100.0)
Platelets: 334 10*3/uL (ref 150–400)
RBC: 3.78 MIL/uL — ABNORMAL LOW (ref 3.87–5.11)
RDW: 14.3 % (ref 11.5–15.5)
WBC: 11.9 10*3/uL — ABNORMAL HIGH (ref 4.0–10.5)
nRBC: 0 % (ref 0.0–0.2)

## 2018-09-07 LAB — PROTEIN / CREATININE RATIO, URINE
Creatinine, Urine: 59 mg/dL
PROTEIN CREATININE RATIO: 0.61 mg/mg{creat} — AB (ref 0.00–0.15)
TOTAL PROTEIN, URINE: 36 mg/dL

## 2018-09-07 LAB — TYPE AND SCREEN
ABO/RH(D): A POS
Antibody Screen: NEGATIVE

## 2018-09-07 MED ORDER — LABETALOL HCL 5 MG/ML IV SOLN: 40.0000 mg | INTRAVENOUS | Status: DC | PRN

## 2018-09-07 MED ORDER — HYDRALAZINE HCL 20 MG/ML IJ SOLN: 10.0000 mg | INTRAMUSCULAR | Status: DC | PRN

## 2018-09-07 MED ORDER — ZOLPIDEM TARTRATE 5 MG PO TABS: 5.0000 mg | ORAL_TABLET | Freq: Every evening | ORAL | Status: DC | PRN

## 2018-09-07 MED ORDER — DOCUSATE SODIUM 100 MG PO CAPS
100.0000 mg | ORAL_CAPSULE | Freq: Every day | ORAL | Status: DC
  Administered 2018-09-09: 100 mg via ORAL
  Filled 2018-09-07: qty 1

## 2018-09-07 MED ORDER — PRENATAL MULTIVITAMIN CH
1.0000 | ORAL_TABLET | Freq: Every day | ORAL | Status: DC
  Administered 2018-09-09: 1 via ORAL
  Filled 2018-09-07: qty 1

## 2018-09-07 MED ORDER — CALCIUM CARBONATE ANTACID 500 MG PO CHEW: 2.0000 | CHEWABLE_TABLET | ORAL | Status: DC | PRN

## 2018-09-07 MED ORDER — LABETALOL HCL 5 MG/ML IV SOLN: 80.0000 mg | INTRAVENOUS | Status: DC | PRN

## 2018-09-07 MED ORDER — ACETAMINOPHEN 325 MG PO TABS: 650.0000 mg | ORAL_TABLET | ORAL | Status: DC | PRN

## 2018-09-07 MED ORDER — LABETALOL HCL 5 MG/ML IV SOLN
20.0000 mg | INTRAVENOUS | Status: DC | PRN
  Filled 2018-09-07: qty 4

## 2018-09-07 NOTE — Progress Notes (Signed)
2103 Provider Wynelle Bourgeois, CNM at bedside and made aware of blood pressures taken, at this time order to hold labetalol protocol until a few more pressures have been taken.

## 2018-09-07 NOTE — Progress Notes (Addendum)
Pt is a difficult IV stick, IV line now established.   Provider Bernerd Pho, CNM at bedside and verbal order given to not give the 1st dose of labetalol.   Labetalol discarded in stericycle box

## 2018-09-07 NOTE — MAU Provider Note (Signed)
Chief Complaint:  Hypertension   First Provider Initiated Contact with Patient 09/07/18 2059     HPI: Miranda Shah is a 42 y.o. G2P0010 at 24w5dwho presents to maternity admissions reporting elevated BP at home with home machine. . She reports good fetal movement, denies LOF, vaginal bleeding, vaginal itching/burning, urinary symptoms, h/a, dizziness, n/v, diarrhea, constipation or fever/chills.  She denies headache, visual changes or RUQ abdominal pain.  Recently discharged after hospitalization for oligohydramnios.  Hypertension  This is a recurrent problem. Associated symptoms include peripheral edema. Pertinent negatives include no anxiety, blurred vision, headaches, malaise/fatigue, neck pain, palpitations or shortness of breath. There are no associated agents to hypertension. Past treatments include nothing. There are no compliance problems.    Past Medical History: Past Medical History:  Diagnosis Date  . Anemia   . Seborrheic dermatitis     Past obstetric history: OB History  Gravida Para Term Preterm AB Living  2       1 0  SAB TAB Ectopic Multiple Live Births    1          # Outcome Date GA Lbr Len/2nd Weight Sex Delivery Anes PTL Lv  2 Current           1 TAB             Past Surgical History: Past Surgical History:  Procedure Laterality Date  . NO PAST SURGERIES      Family History: Family History  Problem Relation Age of Onset  . Diabetes Mother   . Hypertension Mother   . Hypertension Father   . Hyperlipidemia Maternal Grandmother     Social History: Social History   Tobacco Use  . Smoking status: Former Smoker    Packs/day: 0.50    Last attempt to quit: 08/03/2013    Years since quitting: 5.0  . Smokeless tobacco: Never Used  Substance Use Topics  . Alcohol use: Not Currently    Comment: occasional  . Drug use: Not Currently    Types: Marijuana    Comment: occasional    Allergies:  Allergies  Allergen Reactions  . Pecan Extract Allergy  Skin Test Itching    Pecan and walnuts makes Mouth itch  . Pollen Extract Itching    Itching, red eyes. sneezing    Meds:  Medications Prior to Admission  Medication Sig Dispense Refill Last Dose  . calcium carbonate (TUMS - DOSED IN MG ELEMENTAL CALCIUM) 500 MG chewable tablet Chew 1-3 tablets by mouth daily as needed for indigestion or heartburn (depends on indigestion if takes 1-3 tablets).    08/23/2018 at Unknown time  . Iron-FA-B Cmp-C-Biot-Probiotic (FUSION PLUS) CAPS Take 1 capsule by mouth daily.   08/23/2018 at Unknown time  . Prenatal Vit-Fe Fum-Fe Bisg-FA (NATACHEW) 28-1 MG CHEW Chew 1 each by mouth at bedtime.    08/23/2018 at Unknown time    I have reviewed patient's Past Medical Hx, Surgical Hx, Family Hx, Social Hx, medications and allergies.   ROS:  Review of Systems  Constitutional: Negative for malaise/fatigue.  Eyes: Negative for blurred vision.  Respiratory: Negative for shortness of breath.   Cardiovascular: Negative for palpitations.  Musculoskeletal: Negative for neck pain.  Neurological: Negative for headaches.   Other systems negative  Physical Exam   Patient Vitals for the past 24 hrs:  BP Temp Temp src Pulse Resp SpO2 Weight  09/07/18 2053 (!) 155/74 - - 83 - - -  09/07/18 2030 (!) 164/85 98.1 F (36.7 C) - - (!)  104 - -  09/07/18 2028 (!) 191/106 - - - - - -  09/07/18 2027 - 98.1 F (36.7 C) Oral 96 18 95 % 98.4 kg   Vitals:   09/07/18 2028 09/07/18 2030 09/07/18 2053 09/07/18 2103  BP: (!) 191/106 (!) 164/85 (!) 155/74 (!) 162/75  Pulse:   83 83  Resp:  (!) 104    Temp:  98.1 F (36.7 C)    TempSrc:      SpO2:      Weight:        Constitutional: Well-developed, well-nourished female in no acute distress.  Cardiovascular: normal rate and rhythm Respiratory: normal effort, clear to auscultation bilaterally GI: Abd soft, non-tender, gravid appropriate for gestational age.   No rebound or guarding. MS: Extremities nontender, no edema,  normal ROM Neurologic: Alert and oriented x 4.  GU: Neg CVAT.  PELVIC EXAM:  deferred  FHT:  Baseline 140 , moderate variability, accelerations present, no decelerations Contractions: Irregular     Labs: No results found for this or any previous visit (from the past 24 hour(s)). Labs ordered --/--/A POS (10/27 2209)  Imaging:    MAU Course/MDM: I have ordered labs  NST reviewed and is reactive Consult [facility] midwife (who will notify her MD) with presentation, exam findings and test results.  Treatments in MAU included Preeclampsia focused order set.    Assessment: Single intrauterine pregnancy at [redacted]w[redacted]d Gestational hypertension with some severe range pressures Probable preeclampsia Known oligohydramnios  Plan: Admit to HR for observation MD to follow   Wynelle Bourgeois CNM, MSN Certified Nurse-Midwife 09/07/2018 9:00 PM

## 2018-09-07 NOTE — H&P (Signed)
Miranda Shah is a 42 y.o. female, G2P0 at 35.5 weeks, presenting for elevated blood pressure at home, meets criteria for preeclampsia. Denies h/a, RUQ pain, visual changes, VB, LOF. Reports + FM. Last [place] AFI WNL. Planned C/S at 37+0, breech presentation.  Patient Active Problem List   Diagnosis Date Noted  . Oligohydramnios 08/27/2018  . Elevated liver enzymes 08/17/2018  . Gestational hypertension 08/16/2018  . Gestational hypertension without significant proteinuria in third trimester 08/08/2018  . Liver and biliary tract disorders in pregnancy, third trimester 08/03/2018  . Advanced maternal age in multigravida, third trimester 08/03/2018  . Obesity during pregnancy in third trimester 08/03/2018    Pregnancy Problems Leukocytosis (WBC ct elevated on NOB labs--recheck at [place], lower at 13. Recheck with glucola, 12.2.) liver function tests abnormal (AST 99/ALT 236--rechecked in 2 weeks, with hep panel.  AST 160/ALT 272, normal PIH labs and PCR.  Referred to MFM--STILL ELEVATED, ADDITIONAL TESTING DONE.  ABDOMINAL US Performed and showed gallstones, WEEKLY BPP FROM 32 WEEKS, GROWTH Q 4 WEEKS. Anemia (Hgb 10.5 at NOB, Rx Fe supplement; 9.8 at 27 weeks.) advanced maternal age gravida (Declines testing.) pregnancy-induced hypertension (BP bordeline for gestational HTN, will continue to monitor closely. [place] for growth q 4 weeks, weekly antenatal testing from 32 weeks.) allergy to nut Admitted for oilgo at 33+5 per MFM, resolved.   OB History    Gravida  2   Para      Term      Preterm      AB  1   Living  0     SAB      TAB  1   Ectopic      Multiple      Live Births             Past Medical History:  Diagnosis Date  . Anemia   . Seborrheic dermatitis    Past Surgical History:  Procedure Laterality Date  . NO PAST SURGERIES     Family History: family history includes Diabetes in her mother; Hyperlipidemia in her maternal grandmother; Hypertension in her father and  mother. Social History:  reports that she quit smoking about 5 years ago. She smoked 0.50 packs per day. She has never used smokeless tobacco. She reports that she drank alcohol. She reports that she has current or past drug history. Drug: Marijuana.   Prenatal Transfer Tool  Maternal Diabetes: No Genetic Screening: Declined Maternal Ultrasounds/Referrals: Normal Fetal Ultrasounds or other Referrals:  Referred to Materal Fetal Medicine  Maternal Substance Abuse:  No Significant Maternal Medications:  None Significant Maternal Lab Results: None  ROS: All 10 systems reviewed and negative except as noted above.  Allergies  Allergen Reactions  . Pecan Extract Allergy Skin Test Itching    Pecan and walnuts makes Mouth itch  . Pollen Extract Itching    Itching, red eyes. sneezing   Results for orders placed or performed during the hospital encounter of 09/07/18 (from the past 24 hour(s))  Protein / creatinine ratio, urine     Status: Abnormal   Collection Time: 09/07/18  9:34 PM  Result Value Ref Range   Creatinine, Urine 59.00 mg/dL   Total Protein, Urine 36 mg/dL   Protein Creatinine Ratio 0.61 (H) 0.00 - 0.15 mg/mg[Cre]  CBC     Status: Abnormal   Collection Time: 09/07/18  9:59 PM  Result Value Ref Range   WBC 11.9 (H) 4.0 - 10.5 K/uL   RBC 3.78 (L) 3.87 - 5.11  MIL/uL   Hemoglobin 11.0 (L) 12.0 - 15.0 g/dL   HCT 08.6 (L) 57.8 - 46.9 %   MCV 88.1 80.0 - 100.0 fL   MCH 29.1 26.0 - 34.0 pg   MCHC 33.0 30.0 - 36.0 g/dL   RDW 62.9 52.8 - 41.3 %   Platelets 334 150 - 400 K/uL   nRBC 0.0 0.0 - 0.2 %  Comprehensive metabolic panel     Status: Abnormal   Collection Time: 09/07/18  9:59 PM  Result Value Ref Range   Sodium 135 135 - 145 mmol/L   Potassium 4.2 3.5 - 5.1 mmol/L   Chloride 104 98 - 111 mmol/L   CO2 21 (L) 22 - 32 mmol/L   Glucose, Bld 75 70 - 99 mg/dL   BUN 11 6 - 20 mg/dL   Creatinine, Ser 2.44 0.44 - 1.00 mg/dL   Calcium 9.7 8.9 - 01.0 mg/dL   Total Protein 7.3  6.5 - 8.1 g/dL   Albumin 3.0 (L) 3.5 - 5.0 g/dL   AST 272 (H) 15 - 41 U/L   ALT 294 (H) 0 - 44 U/L   Alkaline Phosphatase 187 (H) 38 - 126 U/L   Total Bilirubin 0.5 0.3 - 1.2 mg/dL   GFR calc non Af Amer >60 >60 mL/min   GFR calc Af Amer >60 >60 mL/min   Anion gap 10 5 - 15      Blood pressure (!) 142/74, pulse 79, temperature 98.1 F (36.7 C), resp. rate (!) 104, weight 98.4 kg, last menstrual period 12/26/2017, SpO2 95 %.   Vitals:   09/07/18 2030 09/07/18 2053 09/07/18 2103 09/07/18 2207  BP: (!) 164/85 (!) 155/74 (!) 162/75 (!) 142/74  Pulse:  83 83 79  Resp: (!) 104     Temp: 98.1 F (36.7 C)     TempSrc:      SpO2:      Weight:        Chest clear Heart RRR without murmur Abd gravid, NT, FH 140s Pelvic: deferred Ext: brisk reflexes  FHR: Category 1 UCs:  absent  Prenatal labs: ABO, Rh: --/--/A POS (10/27 2209) Antibody: NEG (10/27 2209) Rubella:   Immune RPR:   NR HBsAg:   NR HIV:   NR GBS:  negative  Assessment/Plan: IUP at 35+5 weeks, preeclampsia, breech by BSUS Category 1 strip,  moderate variability, accels present, no decels.  Plan: Admit to [facility] per consult with [place] Routine [facility] orders Repeat labs in a.m. C/S scheduled for 37+0 weeks   Henderson Newcomer ProtheroCNM, MSN 09/07/2018, 10:21 PM

## 2018-09-07 NOTE — MAU Note (Addendum)
Pt took her BP at home and it was high, 150-160/80-90s No headache or visual changes, pt is gestational htn. No bleeding or LOF, +FM

## 2018-09-07 NOTE — Progress Notes (Signed)
Wynelle Bourgeois, CNM order to start IV and give 1st dose of labetalol.

## 2018-09-08 ENCOUNTER — Inpatient Hospital Stay (HOSPITAL_COMMUNITY): Payer: 59 | Admitting: Certified Registered"

## 2018-09-08 ENCOUNTER — Encounter (HOSPITAL_COMMUNITY)
Admission: AD | Disposition: A | Discharge status: Home / Self Care | Payer: Self-pay | Source: Home / Self Care | Attending: Obstetrics & Gynecology

## 2018-09-08 DIAGNOSIS — O1413 Severe pre-eclampsia, third trimester: Secondary | ICD-10-CM | POA: Diagnosis present

## 2018-09-08 LAB — CBC
HCT: 30.3 % — ABNORMAL LOW (ref 36.0–46.0)
HCT: 32.1 % — ABNORMAL LOW (ref 36.0–46.0)
Hemoglobin: 10 g/dL — ABNORMAL LOW (ref 12.0–15.0)
Hemoglobin: 10.6 g/dL — ABNORMAL LOW (ref 12.0–15.0)
MCH: 29 pg (ref 26.0–34.0)
MCH: 29 pg (ref 26.0–34.0)
MCHC: 33 g/dL (ref 30.0–36.0)
MCHC: 33 g/dL (ref 30.0–36.0)
MCV: 87.8 fL (ref 80.0–100.0)
MCV: 87.7 fL (ref 80.0–100.0)
Platelets: 288 10*3/uL (ref 150–400)
Platelets: 294 K/uL (ref 150–400)
RBC: 3.45 MIL/uL — ABNORMAL LOW (ref 3.87–5.11)
RBC: 3.66 MIL/uL — ABNORMAL LOW (ref 3.87–5.11)
RDW: 14.4 % (ref 11.5–15.5)
RDW: 14.2 % (ref 11.5–15.5)
WBC: 11.8 10*3/uL — ABNORMAL HIGH (ref 4.0–10.5)
WBC: 11.3 K/uL — ABNORMAL HIGH (ref 4.0–10.5)
nRBC: 0 % (ref 0.0–0.2)
nRBC: 0 % (ref 0.0–0.2)

## 2018-09-08 LAB — COMPREHENSIVE METABOLIC PANEL WITH GFR
ALT: 269 U/L — ABNORMAL HIGH (ref 0–44)
AST: 178 U/L — ABNORMAL HIGH (ref 15–41)
Albumin: 2.5 g/dL — ABNORMAL LOW (ref 3.5–5.0)
Alkaline Phosphatase: 159 U/L — ABNORMAL HIGH (ref 38–126)
Anion gap: 9 (ref 5–15)
BUN: 13 mg/dL (ref 6–20)
CO2: 20 mmol/L — ABNORMAL LOW (ref 22–32)
Calcium: 9.1 mg/dL (ref 8.9–10.3)
Chloride: 108 mmol/L (ref 98–111)
Creatinine, Ser: 0.73 mg/dL (ref 0.44–1.00)
GFR calc Af Amer: >60 mL/min
GFR calc non Af Amer: >60 mL/min
Glucose, Bld: 88 mg/dL (ref 70–99)
Potassium: 3.6 mmol/L (ref 3.5–5.1)
Sodium: 137 mmol/L (ref 135–145)
Total Bilirubin: 0.3 mg/dL (ref 0.3–1.2)
Total Protein: 6.8 g/dL (ref 6.5–8.1)

## 2018-09-08 LAB — TYPE AND SCREEN
ABO/RH(D): A POS
Antibody Screen: NEGATIVE

## 2018-09-08 LAB — BASIC METABOLIC PANEL
ANION GAP: 10 (ref 5–15)
BUN: 12 mg/dL (ref 6–20)
CALCIUM: 8.9 mg/dL (ref 8.9–10.3)
CO2: 19 mmol/L — AB (ref 22–32)
CREATININE: 0.76 mg/dL (ref 0.44–1.00)
Chloride: 106 mmol/L (ref 98–111)
GFR calc Af Amer: >60 mL/min (ref >60)
Glucose, Bld: 82 mg/dL (ref 70–99)
Potassium: 3.7 mmol/L (ref 3.5–5.1)
Sodium: 135 mmol/L (ref 135–145)

## 2018-09-08 SURGERY — Surgical Case
Anesthesia: Spinal

## 2018-09-08 MED ORDER — MAGNESIUM SULFATE 40 G IN LACTATED RINGERS - SIMPLE
2.0000 g/h | INTRAVENOUS | Status: AC
  Filled 2018-09-08: qty 500

## 2018-09-08 MED ORDER — OXYTOCIN 10 UNIT/ML IJ SOLN
INTRAVENOUS | Status: DC | PRN
  Administered 2018-09-08: 40 [IU] via INTRAVENOUS

## 2018-09-08 MED ORDER — SODIUM CHLORIDE 0.9% FLUSH: 3.0000 mL | INTRAVENOUS | Status: DC | PRN

## 2018-09-08 MED ORDER — BUPIVACAINE HCL (PF) 0.25 % IJ SOLN
INTRAMUSCULAR | Status: AC
  Filled 2018-09-08: qty 30

## 2018-09-08 MED ORDER — SIMETHICONE 80 MG PO CHEW
80.0000 mg | CHEWABLE_TABLET | ORAL | Status: DC
  Administered 2018-09-09 (×2): 80 mg via ORAL
  Filled 2018-09-08 (×3): qty 1

## 2018-09-08 MED ORDER — MORPHINE SULFATE (PF) 0.5 MG/ML IJ SOLN
INTRAMUSCULAR | Status: AC
  Filled 2018-09-08: qty 10

## 2018-09-08 MED ORDER — LACTATED RINGERS IV SOLN
INTRAVENOUS | Status: DC | PRN
  Administered 2018-09-08 (×2): via INTRAVENOUS

## 2018-09-08 MED ORDER — SODIUM CHLORIDE 0.9 % IR SOLN
Status: DC | PRN
  Administered 2018-09-08: 1

## 2018-09-08 MED ORDER — MEPERIDINE HCL 25 MG/ML IJ SOLN: 6.2500 mg | INTRAMUSCULAR | Status: DC | PRN

## 2018-09-08 MED ORDER — ONDANSETRON HCL 4 MG/2ML IJ SOLN: 4.0000 mg | Freq: Three times a day (TID) | INTRAMUSCULAR | Status: DC | PRN

## 2018-09-08 MED ORDER — DEXAMETHASONE SODIUM PHOSPHATE 4 MG/ML IJ SOLN
INTRAMUSCULAR | Status: DC | PRN
  Administered 2018-09-08: 4 mg via INTRAVENOUS

## 2018-09-08 MED ORDER — KETOROLAC TROMETHAMINE 30 MG/ML IJ SOLN
30.0000 mg | Freq: Once | INTRAMUSCULAR | Status: AC
  Administered 2018-09-08: 30 mg via INTRAMUSCULAR

## 2018-09-08 MED ORDER — FENTANYL CITRATE (PF) 100 MCG/2ML IJ SOLN
INTRAMUSCULAR | Status: AC
  Filled 2018-09-08: qty 2

## 2018-09-08 MED ORDER — DIPHENHYDRAMINE HCL 25 MG PO CAPS: 25.0000 mg | ORAL_CAPSULE | Freq: Four times a day (QID) | ORAL | Status: DC | PRN

## 2018-09-08 MED ORDER — OXYTOCIN 40 UNITS IN LACTATED RINGERS INFUSION - SIMPLE MED: 2.5000 [IU]/h | INTRAVENOUS | Status: AC

## 2018-09-08 MED ORDER — CEFAZOLIN SODIUM-DEXTROSE 2-4 GM/100ML-% IV SOLN
2.0000 g | INTRAVENOUS | Status: DC
  Filled 2018-09-08: qty 100

## 2018-09-08 MED ORDER — BUPIVACAINE HCL 0.25 % IJ SOLN
INTRAMUSCULAR | Status: DC | PRN
  Administered 2018-09-08: 30 mL

## 2018-09-08 MED ORDER — SIMETHICONE 80 MG PO CHEW
80.0000 mg | CHEWABLE_TABLET | Freq: Three times a day (TID) | ORAL | Status: DC
  Administered 2018-09-09 – 2018-09-11 (×4): 80 mg via ORAL
  Filled 2018-09-08 (×4): qty 1

## 2018-09-08 MED ORDER — LACTATED RINGERS IV SOLN
INTRAVENOUS | Status: DC
  Administered 2018-09-08 – 2018-09-09 (×3): via INTRAVENOUS

## 2018-09-08 MED ORDER — DIPHENHYDRAMINE HCL 25 MG PO CAPS
25.0000 mg | ORAL_CAPSULE | ORAL | Status: DC | PRN
  Filled 2018-09-08: qty 1

## 2018-09-08 MED ORDER — PHENYLEPHRINE 8 MG IN D5W 100 ML (0.08MG/ML) PREMIX OPTIME
INJECTION | INTRAVENOUS | Status: DC | PRN
  Administered 2018-09-08: 60 ug/min via INTRAVENOUS

## 2018-09-08 MED ORDER — TETANUS-DIPHTH-ACELL PERTUSSIS 5-2.5-18.5 LF-MCG/0.5 IM SUSP: 0.5000 mL | Freq: Once | INTRAMUSCULAR | Status: DC

## 2018-09-08 MED ORDER — OXYCODONE-ACETAMINOPHEN 5-325 MG PO TABS
1.0000 | ORAL_TABLET | ORAL | Status: DC | PRN
  Administered 2018-09-09 – 2018-09-10 (×2): 1 via ORAL
  Filled 2018-09-08: qty 1

## 2018-09-08 MED ORDER — SENNOSIDES-DOCUSATE SODIUM 8.6-50 MG PO TABS
2.0000 | ORAL_TABLET | ORAL | Status: DC
  Administered 2018-09-09 (×2): 2 via ORAL
  Filled 2018-09-08 (×3): qty 2

## 2018-09-08 MED ORDER — SOD CITRATE-CITRIC ACID 500-334 MG/5ML PO SOLN: 30.0000 mL | ORAL | Status: DC

## 2018-09-08 MED ORDER — OXYTOCIN 10 UNIT/ML IJ SOLN
INTRAMUSCULAR | Status: AC
  Filled 2018-09-08: qty 4

## 2018-09-08 MED ORDER — DEXAMETHASONE SODIUM PHOSPHATE 4 MG/ML IJ SOLN
INTRAMUSCULAR | Status: AC
  Filled 2018-09-08: qty 1

## 2018-09-08 MED ORDER — NALBUPHINE HCL 10 MG/ML IJ SOLN: 5.0000 mg | INTRAMUSCULAR | Status: DC | PRN

## 2018-09-08 MED ORDER — IBUPROFEN 600 MG PO TABS
600.0000 mg | ORAL_TABLET | Freq: Four times a day (QID) | ORAL | Status: DC
  Administered 2018-09-09 – 2018-09-10 (×6): 600 mg via ORAL
  Filled 2018-09-08 (×10): qty 1

## 2018-09-08 MED ORDER — COCONUT OIL OIL
1.0000 "application " | TOPICAL_OIL | Status: DC | PRN
  Administered 2018-09-09: 1 via TOPICAL
  Filled 2018-09-08: qty 120

## 2018-09-08 MED ORDER — FENTANYL CITRATE (PF) 100 MCG/2ML IJ SOLN: 25.0000 ug | INTRAMUSCULAR | Status: DC | PRN

## 2018-09-08 MED ORDER — PROMETHAZINE HCL 25 MG RE SUPP
12.5000 mg | Freq: Three times a day (TID) | RECTAL | Status: DC | PRN
  Filled 2018-09-08: qty 1

## 2018-09-08 MED ORDER — DIPHENHYDRAMINE HCL 50 MG/ML IJ SOLN: 12.5000 mg | INTRAMUSCULAR | Status: DC | PRN

## 2018-09-08 MED ORDER — ZOLPIDEM TARTRATE 5 MG PO TABS: 5.0000 mg | ORAL_TABLET | Freq: Every evening | ORAL | Status: DC | PRN

## 2018-09-08 MED ORDER — NALBUPHINE HCL 10 MG/ML IJ SOLN: 5.0000 mg | Freq: Once | INTRAMUSCULAR | Status: DC | PRN

## 2018-09-08 MED ORDER — MENTHOL 3 MG MT LOZG: 1.0000 | LOZENGE | OROMUCOSAL | Status: DC | PRN

## 2018-09-08 MED ORDER — LACTATED RINGERS IV SOLN
INTRAVENOUS | Status: DC
  Administered 2018-09-08: 12:00:00 via INTRAVENOUS

## 2018-09-08 MED ORDER — FENTANYL CITRATE (PF) 100 MCG/2ML IJ SOLN
INTRAMUSCULAR | Status: DC | PRN
  Administered 2018-09-08: 15 ug via INTRATHECAL

## 2018-09-08 MED ORDER — SOD CITRATE-CITRIC ACID 500-334 MG/5ML PO SOLN
ORAL | Status: AC
  Administered 2018-09-08: 30 mL
  Filled 2018-09-08: qty 15

## 2018-09-08 MED ORDER — SIMETHICONE 80 MG PO CHEW: 80.0000 mg | CHEWABLE_TABLET | ORAL | Status: DC | PRN

## 2018-09-08 MED ORDER — PHENYLEPHRINE 8 MG IN D5W 100 ML (0.08MG/ML) PREMIX OPTIME
INJECTION | INTRAVENOUS | Status: AC
  Filled 2018-09-08: qty 100

## 2018-09-08 MED ORDER — ONDANSETRON HCL 4 MG/2ML IJ SOLN
INTRAMUSCULAR | Status: DC | PRN
  Administered 2018-09-08: 4 mg via INTRAVENOUS

## 2018-09-08 MED ORDER — NALOXONE HCL 4 MG/10ML IJ SOLN
1.0000 ug/kg/h | INTRAVENOUS | Status: DC | PRN
  Filled 2018-09-08: qty 5

## 2018-09-08 MED ORDER — NALOXONE HCL 0.4 MG/ML IJ SOLN: 0.4000 mg | INTRAMUSCULAR | Status: DC | PRN

## 2018-09-08 MED ORDER — CEFAZOLIN SODIUM-DEXTROSE 2-4 GM/100ML-% IV SOLN
2.0000 g | INTRAVENOUS | Status: AC
  Administered 2018-09-08: 2 g via INTRAVENOUS
  Filled 2018-09-08: qty 100

## 2018-09-08 MED ORDER — BUPIVACAINE IN DEXTROSE 0.75-8.25 % IT SOLN
INTRATHECAL | Status: DC | PRN
  Administered 2018-09-08: 11 mg via INTRATHECAL

## 2018-09-08 MED ORDER — PRENATAL MULTIVITAMIN CH
1.0000 | ORAL_TABLET | Freq: Every day | ORAL | Status: DC
  Administered 2018-09-10 – 2018-09-11 (×2): 1 via ORAL
  Filled 2018-09-08 (×2): qty 1

## 2018-09-08 MED ORDER — ACETAMINOPHEN 325 MG PO TABS: 650.0000 mg | ORAL_TABLET | ORAL | Status: DC | PRN

## 2018-09-08 MED ORDER — KETOROLAC TROMETHAMINE 30 MG/ML IJ SOLN
INTRAMUSCULAR | Status: AC
  Filled 2018-09-08: qty 1

## 2018-09-08 MED ORDER — MAGNESIUM SULFATE BOLUS VIA INFUSION
4.0000 g | Freq: Once | INTRAVENOUS | Status: AC
  Administered 2018-09-08: 4 g via INTRAVENOUS
  Filled 2018-09-08: qty 500

## 2018-09-08 MED ORDER — ONDANSETRON HCL 4 MG/2ML IJ SOLN
INTRAMUSCULAR | Status: AC
  Filled 2018-09-08: qty 2

## 2018-09-08 MED ORDER — OXYCODONE-ACETAMINOPHEN 5-325 MG PO TABS
2.0000 | ORAL_TABLET | ORAL | Status: DC | PRN
  Administered 2018-09-09 – 2018-09-11 (×3): 2 via ORAL
  Filled 2018-09-08 (×5): qty 2

## 2018-09-08 MED ORDER — MORPHINE SULFATE (PF) 0.5 MG/ML IJ SOLN
INTRAMUSCULAR | Status: DC | PRN
  Administered 2018-09-08: 15 mg via INTRATHECAL

## 2018-09-08 SURGICAL SUPPLY — 45 items
APL SKNCLS STERI-STRIP NONHPOA (GAUZE/BANDAGES/DRESSINGS) ×1
BENZOIN TINCTURE PRP APPL 2/3 (GAUZE/BANDAGES/DRESSINGS) ×3 IMPLANT
CHLORAPREP W/TINT 26ML (MISCELLANEOUS) ×3 IMPLANT
CLAMP CORD UMBIL (MISCELLANEOUS) IMPLANT
CLOSURE STERI-STRIP 1/2X4 (GAUZE/BANDAGES/DRESSINGS) ×1
CLOSURE WOUND 1/2 X4 (GAUZE/BANDAGES/DRESSINGS) ×1
CLOTH BEACON ORANGE TIMEOUT ST (SAFETY) ×3 IMPLANT
CLSR STERI-STRIP ANTIMIC 1/2X4 (GAUZE/BANDAGES/DRESSINGS) ×2 IMPLANT
DRSG OPSITE POSTOP 4X10 (GAUZE/BANDAGES/DRESSINGS) ×3 IMPLANT
ELECT REM PT RETURN 9FT ADLT (ELECTROSURGICAL) ×3
ELECTRODE REM PT RTRN 9FT ADLT (ELECTROSURGICAL) ×1 IMPLANT
EXTRACTOR VACUUM BELL STYLE (SUCTIONS) IMPLANT
EXTRACTOR VACUUM KIWI (MISCELLANEOUS) IMPLANT
GAUZE SPONGE 4X4 12PLY STRL LF (GAUZE/BANDAGES/DRESSINGS) ×3 IMPLANT
GLOVE BIO SURGEON STRL SZ 6.5 (GLOVE) ×2 IMPLANT
GLOVE BIO SURGEONS STRL SZ 6.5 (GLOVE) ×1
GLOVE BIOGEL PI IND STRL 7.0 (GLOVE) ×2 IMPLANT
GLOVE BIOGEL PI INDICATOR 7.0 (GLOVE) ×4
GOWN STRL REUS W/TWL LRG LVL3 (GOWN DISPOSABLE) ×9 IMPLANT
KIT ABG SYR 3ML LUER SLIP (SYRINGE) IMPLANT
NEEDLE HYPO 22GX1.5 SAFETY (NEEDLE) IMPLANT
NEEDLE HYPO 25X5/8 SAFETYGLIDE (NEEDLE) IMPLANT
NEEDLE SPNL 18GX3.5 QUINCKE PK (NEEDLE) ×3 IMPLANT
NS IRRIG 1000ML POUR BTL (IV SOLUTION) ×3 IMPLANT
PACK C SECTION WH (CUSTOM PROCEDURE TRAY) ×3 IMPLANT
PAD ABD 7.5X8 STRL (GAUZE/BANDAGES/DRESSINGS) ×6 IMPLANT
PAD OB MATERNITY 4.3X12.25 (PERSONAL CARE ITEMS) ×3 IMPLANT
PENCIL SMOKE EVAC W/HOLSTER (ELECTROSURGICAL) ×3 IMPLANT
RETAINER VISCERAL (MISCELLANEOUS) ×3 IMPLANT
RTRCTR C-SECT PINK 25CM LRG (MISCELLANEOUS) ×3 IMPLANT
SPONGE LAP 18X18 RF (DISPOSABLE) ×9 IMPLANT
STRIP CLOSURE SKIN 1/2X4 (GAUZE/BANDAGES/DRESSINGS) ×2 IMPLANT
SUT CHROMIC 2 0 CT 1 (SUTURE) ×6 IMPLANT
SUT MNCRL 0 VIOLET CTX 36 (SUTURE) ×2 IMPLANT
SUT MONOCRYL 0 CTX 36 (SUTURE) ×4
SUT PDS AB 0 CTX 36 PDP370T (SUTURE) IMPLANT
SUT PLAIN 2 0 (SUTURE)
SUT PLAIN 2 0 XLH (SUTURE) ×3 IMPLANT
SUT PLAIN ABS 2-0 CT1 27XMFL (SUTURE) IMPLANT
SUT VIC AB 0 CTX 36 (SUTURE) ×6
SUT VIC AB 0 CTX36XBRD ANBCTRL (SUTURE) ×2 IMPLANT
SUT VIC AB 4-0 KS 27 (SUTURE) ×3 IMPLANT
SYR CONTROL 10ML LL (SYRINGE) IMPLANT
TOWEL OR 17X24 6PK STRL BLUE (TOWEL DISPOSABLE) ×3 IMPLANT
TRAY FOLEY W/BAG SLVR 14FR LF (SET/KITS/TRAYS/PACK) ×3 IMPLANT

## 2018-09-08 NOTE — Op Note (Signed)
Cesarean Section Procedure Note  Mekaela Azizi Mortellaro  DOB:    03/10/1976  MRN:    [id number]  Date of Surgery:  09/08/2018  Indications: 35 + 6  week SIUP breech presentation, preeclampsia with severe features, oligohydramnios  Pre-operative Diagnosis: 1. Breech presentation                                              2. Preeclampsia with severe features                                              3. Oligohydramnios  Post-operative Diagnosis: same  Procedure:  Primary cesarean delivery                          Surgeon: Wynonia Hazard, MD  Assistants: None  Anesthesia: Spinal anesthesia  ASA Class: 2  Procedure Details   The patient was counseled about the risks, benefits, complications of the cesarean section. The patient concurred with the proposed plan, giving informed consent.  The site of surgery properly noted/marked. The patient was taken to Operating Room # 9, identified as Amily Breon. . A Time Out was held and the above information confirmed.  After spinal was found to adequate , the patient was placed in the dorsal supine position with a leftward tilt, draped and prepped in the usual sterile manner. A Pfannenstiel incision was made and carried down through the subcutaneous tissue to the fascia.  The fascia was incised in the midline and the fascial incision was extended laterally with Mayo scissors. The superior aspect of the fascial incision was grasped with Coker clamps x2, tented up and the rectus muscles dissected off sharply with the bovie.  The rectus was then dissected off with blunt dissection and Mayo scissors inferiorly. The rectus muscles were separated in the midline. The abdominal peritoneum was identified, tented up, entered sharply using Metzenbaum scissors, and the incision was extended superiorly and inferiorly with good visualization of the bladder. The Alexis retractor was then deployed. The vesicouterine peritoneum was identified, tented up, entered sharply  with Metzenbaum scissors, and the bladder flap was created digitally. Scalpel was then used to make a low transverse incision on the uterus which was extended laterally with  blunt dissection. The fetal breech was identified, each leg was delivered  The body was delivered until the shoulders.  Each arm was delivered via Pinard maneuver. The head was delivered via Domenic Schwab, [place] maneuver. The A live female infant was bulb suctioned on the operative field cried vigorously, cord was clamped and cut and the infant was passed to the waiting neonatologist. Apgars 9/9. Placenta was then delivered spontaneously, intact and appear normal, the uterus was cleared of all clot and debris. The uterine incision was repaired with #0 Monocryl in running locked fashion. A second imbricating suture was performed using the same suture. The incision was hemostatic. Ovaries and tubes were inspected and normal.  The Alexis retractor was removed. The abdominal cavity was cleared of all clot and debris. The abdominal peritoneum was reapproximated with 2-0 chromic  in a running fashion, the rectus muscles was reapproximated with #2 chromic in interrupted fashion. The fascia was closed with 0 Vicryl in a  running fashion. The subcuticular layer was irrigated and all bleeders cauterized.  30 mL of 0.25% Marcaine was injected into the subcutaneous layer.  The Scarpas fascia was re-approximated with interrupted sutures of 2-0 plain.   The skin was closed with 4-0 vicryl in a subcuticular fashion using a Mellody Dance needle. The incision was dressed with benzoine, steri strips and pressure dressing. All sponge lap and needle counts were correct x3.   Patient tolerated the procedure well and recovered in stable condition following the procedure.  Instrument, sponge, and needle counts were correct prior the abdominal closure and at the conclusion of the case.   Findings: Live female infant, Apgars 9/9, clear amniotic fluid, normal appearing  placenta, normal uterus, bilateral tubes and ovaries  Estimated Blood Loss:  IVF:  700 mL LR         Drains: Foley catheter  Urine output: 250 mL clear         Specimens: Placenta to Path         Implants: none         Complications:  None; patient tolerated the procedure well.         Disposition: PACU - hemodynamically stable.   Armani Brar [name]

## 2018-09-08 NOTE — Transfer of Care (Signed)
Immediate Anesthesia Transfer of Care Note  Patient: Miranda Shah  Procedure(s) Performed: CESAREAN SECTION (N/A )  Patient Location: PACU  Anesthesia Type:Spinal  Level of Consciousness: awake  Airway & Oxygen Therapy: Patient Spontanous Breathing  Post-op Assessment: Report given to RN and Post -op Vital signs reviewed and stable  Post vital signs: stable  Last Vitals:  Vitals Value Taken Time  BP 152/81 09/08/2018  5:30 PM  Temp    Pulse 84 09/08/2018  5:32 PM  Resp 15 09/08/2018  5:32 PM  SpO2 98 % 09/08/2018  5:32 PM  Vitals shown include unvalidated device data.  Last Pain:  Vitals:   09/08/18 1211  TempSrc: Oral  PainSc:          Complications: No apparent anesthesia complications

## 2018-09-08 NOTE — Anesthesia Preprocedure Evaluation (Signed)
Anesthesia Evaluation  Patient identified by MRN, date of birth, ID band Patient awake    Reviewed: Allergy & Precautions, NPO status , Patient's Chart, lab work & pertinent test results, reviewed documented beta blocker date and time   Airway Mallampati: III  TM Distance: >3 FB Neck ROM: Full    Dental no notable dental hx. (+) Teeth Intact   Pulmonary former smoker,    Pulmonary exam normal breath sounds clear to auscultation       Cardiovascular hypertension, Pt. on medications and Pt. on home beta blockers Normal cardiovascular exam Rhythm:Regular Rate:Normal     Neuro/Psych negative neurological ROS  negative psych ROS   GI/Hepatic GERD  Medicated,Elevated LFT's   Endo/Other  Obesity  Renal/GU negative Renal ROS  negative genitourinary   Musculoskeletal negative musculoskeletal ROS (+)   Abdominal (+) + obese,   Peds  Hematology  (+) anemia ,   Anesthesia Other Findings   Reproductive/Obstetrics (+) Pregnancy 35 6/7 weeks Pre eclampsia with severe features                             Anesthesia Physical Anesthesia Plan  ASA: III  Anesthesia Plan: Spinal   Post-op Pain Management:    Induction:   PONV Risk Score and Plan: 4 or greater and Scopolamine patch - Pre-op, Dexamethasone, Ondansetron and Treatment may vary due to age or medical condition  Airway Management Planned: Natural Airway  Additional Equipment:   Intra-op Plan:   Post-operative Plan:   Informed Consent: I have reviewed the patients History and Physical, chart, labs and discussed the procedure including the risks, benefits and alternatives for the proposed anesthesia with the patient or authorized representative who has indicated his/her understanding and acceptance.   Dental advisory given  Plan Discussed with: CRNA and Surgeon  Anesthesia Plan Comments:         Anesthesia Quick  Evaluation

## 2018-09-08 NOTE — Anesthesia Procedure Notes (Signed)
Spinal  Patient location during procedure: OR Start time: 09/08/2018 4:05 PM End time: 09/08/2018 4:09 PM Staffing Anesthesiologist: Mal Amabile, MD Performed: anesthesiologist  Preanesthetic Checklist Completed: patient identified, site marked, surgical consent, pre-op evaluation, timeout performed, IV checked, risks and benefits discussed and monitors and equipment checked Spinal Block Patient position: sitting Prep: site prepped and draped and DuraPrep Patient monitoring: heart rate, cardiac monitor, continuous pulse ox and blood pressure Approach: midline Location: L3-4 Injection technique: single-shot Needle Needle type: Pencan  Needle gauge: 24 G Needle length: 9 cm Needle insertion depth: 7 cm Assessment Sensory level: T4 Additional Notes Patient tolerated procedure well. Adequate sensory level.

## 2018-09-08 NOTE — Progress Notes (Signed)
1920 This RN approached by Kathalene Frames, CNM that pt has new order for mag drip and pt will need to be transferred to [facility] specialty care unit.  1938 CN Mcnabb called unit for bed availability.  1950 Pt began to vomit green emesis. CN called CNM for medication order for antiemetic.  2045. This RN and [facility], Tech transferred pt in bed and infant in crib to inpatient room 304. Report given to Laurene Footman, RN.

## 2018-09-08 NOTE — Progress Notes (Signed)
Name: Miranda Shah Medical Record Number:  [id number] Date of Birth: 02-23-1976 Date of Service: 09/08/2018  42 y.o. G2P0010 [redacted]w[redacted]d HD#1 admitted for 35 WKS, ELEVATED BP.  Pt currently stable with no complaints. She denies HA, no blurry vision, no RUQ pain. She denies contractions, no vaginal bleeding, no leaking of fluid. Reports good FM.  The patient's past medical history and prenatal records were reviewed.  Additional issues addressed and updated today: Patient Active Problem List   Diagnosis Date Noted  . Oligohydramnios 08/27/2018  . Elevated liver enzymes 08/17/2018  . Gestational hypertension 08/16/2018  . Gestational hypertension without significant proteinuria in third trimester 08/08/2018  . Liver and biliary tract disorders in pregnancy, third trimester 08/03/2018  . Advanced maternal age in multigravida, third trimester 08/03/2018  . Obesity during pregnancy in third trimester 08/03/2018    Physical Examination:   Vitals:   09/08/18 0650 09/08/18 0823  BP: (!) 153/72 (!) 147/75  Pulse:  76  Resp:  16  Temp:  97.8 F (36.6 C)  SpO2:  99%   General appearance - alert, well appearing, and in no distress and oriented to person, place, and time Mental status - alert, oriented to person, place, and time, normal mood, behavior, speech, dress, motor activity, and thought processes  Abd  Soft, gravid, nontender Ex SCDs FHTs  150s, moderate variability accels no decels Toco  No ctx  Cervix: not evaluated  Results for orders placed or performed during the hospital encounter of 09/07/18 (from the past 24 hour(s))  Protein / creatinine ratio, urine     Status: Abnormal   Collection Time: 09/07/18  9:34 PM  Result Value Ref Range   Creatinine, Urine 59.00 mg/dL   Total Protein, Urine 36 mg/dL   Protein Creatinine Ratio 0.61 (H) 0.00 - 0.15 mg/mg[Cre]  CBC     Status: Abnormal   Collection Time: 09/07/18  9:59 PM  Result Value Ref Range   WBC 11.9 (H) 4.0 - 10.5 K/uL   RBC 3.78 (L) 3.87 - 5.11 MIL/uL   Hemoglobin 11.0 (L) 12.0 - 15.0 g/dL   HCT 40.9 (L) 81.1 - 91.4 %   MCV 88.1 80.0 - 100.0 fL   MCH 29.1 26.0 - 34.0 pg   MCHC 33.0 30.0 - 36.0 g/dL   RDW 78.2 95.6 - 21.3 %   Platelets 334 150 - 400 K/uL   nRBC 0.0 0.0 - 0.2 %  Comprehensive metabolic panel     Status: Abnormal   Collection Time: 09/07/18  9:59 PM  Result Value Ref Range   Sodium 135 135 - 145 mmol/L   Potassium 4.2 3.5 - 5.1 mmol/L   Chloride 104 98 - 111 mmol/L   CO2 21 (L) 22 - 32 mmol/L   Glucose, Bld 75 70 - 99 mg/dL   BUN 11 6 - 20 mg/dL   Creatinine, Ser 0.86 0.44 - 1.00 mg/dL   Calcium 9.7 8.9 - 57.8 mg/dL   Total Protein 7.3 6.5 - 8.1 g/dL   Albumin 3.0 (L) 3.5 - 5.0 g/dL   AST 469 (H) 15 - 41 U/L   ALT 294 (H) 0 - 44 U/L   Alkaline Phosphatase 187 (H) 38 - 126 U/L   Total Bilirubin 0.5 0.3 - 1.2 mg/dL   GFR calc non Af Amer >60 >60 mL/min   GFR calc Af Amer >60 >60 mL/min   Anion gap 10 5 - 15  Type and screen [facility]  Status: None   Collection Time: 09/07/18  9:59 PM  Result Value Ref Range   ABO/RH(D) A POS    Antibody Screen NEG    Sample Expiration      09/10/2018 Performed at [facility], [address], [place], [place] [address]   Comprehensive metabolic panel     Status: Abnormal   Collection Time: 09/08/18  5:43 AM  Result Value Ref Range   Sodium 137 135 - 145 mmol/L   Potassium 3.6 3.5 - 5.1 mmol/L   Chloride 108 98 - 111 mmol/L   CO2 20 (L) 22 - 32 mmol/L   Glucose, Bld 88 70 - 99 mg/dL   BUN 13 6 - 20 mg/dL   Creatinine, Ser 6.44 0.44 - 1.00 mg/dL   Calcium 9.1 8.9 - 03.4 mg/dL   Total Protein 6.8 6.5 - 8.1 g/dL   Albumin 2.5 (L) 3.5 - 5.0 g/dL   AST 742 (H) 15 - 41 U/L   ALT 269 (H) 0 - 44 U/L   Alkaline Phosphatase 159 (H) 38 - 126 U/L   Total Bilirubin 0.3 0.3 - 1.2 mg/dL   GFR calc non Af Amer >60 >60 mL/min   GFR calc Af Amer >60 >60 mL/min   Anion gap 9 5 - 15  CBC     Status: Abnormal   Collection  Time: 09/08/18  5:43 AM  Result Value Ref Range   WBC 11.8 (H) 4.0 - 10.5 K/uL   RBC 3.45 (L) 3.87 - 5.11 MIL/uL   Hemoglobin 10.0 (L) 12.0 - 15.0 g/dL   HCT 59.5 (L) 63.8 - 75.6 %   MCV 87.8 80.0 - 100.0 fL   MCH 29.0 26.0 - 34.0 pg   MCHC 33.0 30.0 - 36.0 g/dL   RDW 43.3 29.5 - 18.8 %   Platelets 288 150 - 400 K/uL   nRBC 0.0 0.0 - 0.2 %    Assessment: HD#1  [redacted]w[redacted]d with preeclampsia with severe features of elevated AST / ALT and severe range BPs overnight,  No neurologic sx.  Plan: 1. Primary cesarean delivery today for fetal breech presentation 2. No neurologic s/sx so will hold off on magnesium sulfate 3. Patient is BMZ complete  Miranda Shah Miranda Shah

## 2018-09-09 ENCOUNTER — Encounter (HOSPITAL_COMMUNITY): Payer: Self-pay | Admitting: Obstetrics & Gynecology

## 2018-09-09 DIAGNOSIS — O9081 Anemia of the puerperium: Secondary | ICD-10-CM

## 2018-09-09 LAB — CBC
HEMATOCRIT: 28.8 % — AB (ref 36.0–46.0)
HEMOGLOBIN: 9.6 g/dL — AB (ref 12.0–15.0)
MCH: 29.2 pg (ref 26.0–34.0)
MCHC: 33.3 g/dL (ref 30.0–36.0)
MCV: 87.5 fL (ref 80.0–100.0)
Platelets: 337 10*3/uL (ref 150–400)
RBC: 3.29 MIL/uL — ABNORMAL LOW (ref 3.87–5.11)
RDW: 14.6 % (ref 11.5–15.5)
WBC: 16 10*3/uL — AB (ref 4.0–10.5)
nRBC: 0 % (ref 0.0–0.2)

## 2018-09-09 LAB — RPR: RPR: NONREACTIVE

## 2018-09-09 MED ORDER — FERROUS SULFATE 325 (65 FE) MG PO TABS
325.0000 mg | ORAL_TABLET | Freq: Every day | ORAL | Status: DC
  Administered 2018-09-09 – 2018-09-11 (×3): 325 mg via ORAL
  Filled 2018-09-09 (×3): qty 1

## 2018-09-09 MED ORDER — VITAMIN C 500 MG PO TABS
500.0000 mg | ORAL_TABLET | Freq: Every day | ORAL | Status: DC
  Administered 2018-09-09 – 2018-09-10 (×2): 500 mg via ORAL
  Filled 2018-09-09 (×4): qty 1

## 2018-09-09 NOTE — Progress Notes (Signed)
Pressure dressing removed. Changed steri-strips and honey per MD order which were covered with old bloody drainage.  No active bleeding noted

## 2018-09-09 NOTE — Lactation Note (Addendum)
This note was copied from a baby's chart. Lactation Consultation Note  Patient Name: Miranda Shah Miranda Shah [id number] Date: 09/09/2018 Reason for consult: Initial assessment;Primapara;1st time breastfeeding;NICU baby;Late-preterm 34-36.6wks;Infant weight loss;Infant < 6lbs  Visited with mom of a 66 hours old late pre-ter female; mom has already started pumping. LC had to readjust the junctures in her DEBP because they're lose. Let mom know that she'll feel a difference in the suction level the next time she pumps. Mom is a P1 but she already knows how to hand express. When reviewing hand expressing with her, she was able to get one small droplet of colostrum. Noticed that mom's nipples were flat at first, but as soon as she started hand expression they evert upon stimulation.  Mom voiced LC that she pumped twice today and that "nothing came out'. Explained to mom that she's not supposed to get volume at this point and that all what pumping is doing now is just breast stimulation. Parents voiced understanding. She has a Spectra DEBP at home.  Feeding plan:  1. Encouraged mom to pump every 3 hours and at least once at night. A minimum of 8 pumping sessions in a 24 hours period 2. Mom will start using coconut oil prior pumping, requested her RN Selena Batten, and she'll be bringing it in the room 3. Once mom starts getting drops/volume she'll be turning those in the snappies to her RN to be taken to her NICU baby. Parents aware of breastmilk storage guidelines for NICU infants.  BF brochure, BF resources and pumping log were reviewed. Parents reported all questions and concerns were answered, they're both aware of LC services and will call PRN.  Maternal Data Formula Feeding for Exclusion: No Has patient been taught Hand Expression?: Yes Does the patient have breastfeeding experience prior to this delivery?: No  Feeding Feeding Type: Donor Breast Milk Nipple Type: Nfant Extra Slow Flow  (gold)  Interventions Interventions: Breast feeding basics reviewed;Breast massage;Hand express;Breast compression;DEBP;Coconut oil  Lactation Tools Discussed/Used Tools: Pump;Coconut oil Breast pump type: Double-Electric Breast Pump WIC Program: No Pump Review: Setup, frequency, and cleaning;Milk Storage Initiated by:: RN and IBCLC Date initiated:: 09/09/18   Consult Status Consult Status: Follow-up Date: 09/10/18 Follow-up type: In-patient    Mykalah Saari Venetia Constable 09/09/2018, 2:36 PM

## 2018-09-09 NOTE — Anesthesia Postprocedure Evaluation (Signed)
Anesthesia Post Note  Patient: Miranda Shah  Procedure(s) Performed: CESAREAN SECTION (N/A )     Patient location during evaluation: PACU Anesthesia Type: Spinal Level of consciousness: oriented and awake and alert Pain management: pain level controlled Vital Signs Assessment: post-procedure vital signs reviewed and stable Respiratory status: spontaneous breathing, respiratory function stable and nonlabored ventilation Cardiovascular status: blood pressure returned to baseline and stable Postop Assessment: no headache, no backache, no apparent nausea or vomiting, patient able to bend at knees and spinal receding Anesthetic complications: no       Miranda Shah A.

## 2018-09-09 NOTE — Progress Notes (Signed)
Subjective: Postpartum Day 1: Cesarean Delivery Patient reports nausea and vomiting.  Catheter still in and draining - she was concerned about UOP. Vomited last night s/p c/s and then again this AM after drinking water quickly. Feels like she should be drinking more water to help with her urine output. She has been having crackers since vomiting and those have stayed down and she denies nausea with that.  Pain well controlled. No flatus, no BM, does not feel bloated. Is burping.  No leg pain. Ambulated in the hall twice.  Newborn - baby Mosetta Anis - is in NICU for temperature monitoring. Will know later today how [place] he is anticipated to be there. She is pumping. Has not been able to get any milk expressed. Has been shown re: manual expression. Is not sure how often to pump.  No HA, blurred vision, RUQ pain.   Objective: Vital signs in last 24 hours: Temp:  [97.5 F (36.4 C)-98.5 F (36.9 C)] 97.9 F (36.6 C) (11/09 0743) Pulse Rate:  [66-99] 82 (11/09 0743) Resp:  [11-23] 18 (11/09 0743) BP: (126-160)/(55-87) 142/78 (11/09 0743) SpO2:  [91 %-100 %] 97 % (11/09 0743)  Physical Exam:  General: alert, cooperative and no distress Lochia: appropriate Uterine Fundus: firm Incision: no significant drainage, no dehiscence, no significant erythema; pressure bandage on. Honeycomb is saturated. DVT Evaluation: Negative Homan's sign. No significant calf/ankle edema. Abdomen - mild distended, nontender. +BS all quads GU catheter draining clear/pale yellow urine. Lungs CTAB  Recent Labs    09/08/18 1145 09/09/18 0709  HGB 10.6* 9.6*  HCT 32.1* 28.8*     Assessment/Plan: 1) Status post Cesarean section. Doing well postoperatively.  Continue current care. D/c foley and continue strict I(&O. Adequate UOP. Want her to be able to move/ambulate better.  Will also replace honeycomb dressing. Current one is saturated and it is difficult to assess status of incision although it currently  appears intact without significant drainage or s/s infection.  Patient Active Problem List   Diagnosis Date Noted  . Anemia affecting pregnancy in third trimester 09/09/2018  . Preeclampsia, severe, third trimester 09/08/2018  . Elevated liver enzymes 08/17/2018  . Obesity during pregnancy in third trimester 08/03/2018  . Maternal age 54+, multigravida, antepartum 04/04/2018   2) preE with severe features: BP stable. Asymptomatic. Consulted with Dr Mora Appl - will continue magnesium until 24h after initiation which is 2000 this PM. Recommend continued inpatient evaluation until at least POD#3. Will re-check CMP/CBC if symptoms and/or BP worsen. Recommend BP check within 1 week of discharge. She has good support at home and is working to find new PCP. Talked about ASA 81mg  daily for preE prevention if she chooses pregnancy again (not planning another pregnancy at this time) and strategies to lower risk of CVD in future.   3) Anemia - FeSO4 recommended daily. Ordered with vitC.   Faylene Million Alger Kerstein 09/09/2018, 8:20 AM

## 2018-09-09 NOTE — Addendum Note (Signed)
Addendum  created 09/09/18 2130 by Elgie Congo, CRNA   Sign clinical note

## 2018-09-09 NOTE — Anesthesia Postprocedure Evaluation (Signed)
Anesthesia Post Note  Patient: Miranda Shah  Procedure(s) Performed: CESAREAN SECTION (N/A )     Patient location during evaluation: [facility] Unit Anesthesia Type: Spinal Level of consciousness: oriented and awake and alert Pain management: pain level controlled Vital Signs Assessment: post-procedure vital signs reviewed and stable Respiratory status: spontaneous breathing, respiratory function stable and nonlabored ventilation Cardiovascular status: stable Postop Assessment: no headache, no backache, no apparent nausea or vomiting, patient able to bend at knees, adequate PO intake, spinal receding and able to ambulate Anesthetic complications: no    Last Vitals:  Vitals:   09/09/18 0700 09/09/18 0743  BP:  (!) 142/78  Pulse:  82  Resp: 17 18  Temp:  36.6 C  SpO2:  97%    Last Pain:  Vitals:   09/09/18 0743  TempSrc: Oral  PainSc:    Pain Goal: Patients Stated Pain Goal: 3 (09/08/18 2100)               Laban Emperor

## 2018-09-10 NOTE — Lactation Note (Signed)
This note was copied from a baby's chart. Lactation Consultation Note: Mom reports she has just finished pumping but only obtained a few drops of Colostrum. Encouragement given. Reports she pumped 2 times yesterday but will start pumping more often today. Encouraged to pump 8 times/24 hours. Has Spectra pump for home. No questions at present. To call prn  Patient Name: Miranda Shah [id number] Date: 09/10/2018 Reason for consult: Follow-up assessment;Late-preterm 34-36.6wks   Maternal Data Has patient been taught Hand Expression?: Yes Does the patient have breastfeeding experience prior to this delivery?: No  Feeding Feeding Type: Donor Breast Milk  LATCH Score                   Interventions    Lactation Tools Discussed/Used WIC Program: No   Consult Status Consult Status: Follow-up Date: 09/11/18 Follow-up type: In-patient    Pamelia Hoit 09/10/2018, 9:21 AM

## 2018-09-10 NOTE — Progress Notes (Addendum)
Subjective: Postpartum Day 2: Cesarean Delivery Patient reports tolerating PO, + flatus and no problems voiding.    Objective: Vital signs in last 24 hours: Temp:  [98.1 F (36.7 C)-98.9 F (37.2 C)] 98.9 F (37.2 C) (11/10 0848) Pulse Rate:  [73-90] 82 (11/10 0848) Resp:  [16-18] 18 (11/10 0848) BP: (104-155)/(61-97) 141/86 (11/10 0848) SpO2:  [91 %-97 %] 97 % (11/10 0848)  Physical Exam:  General: alert, cooperative, no distress and moderately obese Lochia: appropriate Uterine Fundus: firm Incision: healing well, no significant drainage, no dehiscence DVT Evaluation: No evidence of DVT seen on physical exam. Negative Homan's sign. Calf/Ankle edema is present.  Recent Labs    09/08/18 1145 09/09/18 0709  HGB 10.6* 9.6*  HCT 32.1* 28.8*    Assessment/Plan: Status post Cesarean section. Postoperative course complicated by HYPERTENSION  Consulted with Dr. Sallye Ober - plan to observe BP and if >160 or >100 will start on procardia 30 XL daily.    Altamese Cabal 09/10/2018, 8:55 AM  Would give Procardia if BPs equal to or greater than 150/100.  Dr. Sallye Ober.  09/10/18 1419.

## 2018-09-11 MED ORDER — LABETALOL HCL 100 MG PO TABS: 100.0000 mg | ORAL_TABLET | Freq: Two times a day (BID) | ORAL | 1 refills | Status: DC

## 2018-09-11 MED ORDER — LABETALOL HCL 100 MG PO TABS
100.0000 mg | ORAL_TABLET | Freq: Two times a day (BID) | ORAL | Status: DC
  Administered 2018-09-11: 100 mg via ORAL
  Filled 2018-09-11: qty 1

## 2018-09-11 MED ORDER — OXYCODONE-ACETAMINOPHEN 5-325 MG PO TABS: 1.0000 | ORAL_TABLET | ORAL | 0 refills | Status: DC | PRN

## 2018-09-11 MED ORDER — IBUPROFEN 600 MG PO TABS: 600.0000 mg | ORAL_TABLET | Freq: Four times a day (QID) | ORAL | 0 refills | Status: DC

## 2018-09-11 NOTE — Lactation Note (Signed)
This note was copied from a baby's chart. Lactation Consultation Note  Patient Name: Miranda Shah [id number] Date: 09/11/2018 Reason for consult: Follow-up assessment;Late-preterm 34-36.6wks;Infant weight loss  Visited with mom of a 34 hours old late preterm female; baby is no longer in NICU but in mother's room. Per RN, she's been assigned a "transitional room", baby is still being taken care of by NICU RNs. Mom hasn't been pumping consistently, she's done it 3 times yesterday and twice today. Explained to mom the importance of consistent pumping. She voiced her milk is already changing color, going to a more "watery white", mom may be getting transitional milk already.  Encouraged her to keep pumping consistently at least 8 times/24 hours. STS was also encouraged, mom had baby on her chest when entering the room, but not doing STS, she had him wrapped in a blanket and mom was wearing her robe. Discussed the benefits of STS. Mom reported all questions and concerns were answered, she's aware of LC services and will call PRN.  Maternal Data    Feeding Feeding Type: Donor Breast Milk Nipple Type: Nfant Extra Slow Flow (gold)  Interventions Interventions: Breast feeding basics reviewed  Lactation Tools Discussed/Used     Consult Status Consult Status: Follow-up Date: 09/12/18 Follow-up type: In-patient    Miranda Shah 09/11/2018, 1:30 PM

## 2018-09-11 NOTE — Progress Notes (Signed)
Contact with [facility], CNM in regards to patient refusing to take BP medication. Pt states that she wants to speak with provider when she is available before taking medication. Provider is ok withholding 10 am dose at this time. Will continue to monitor. Carmelina Dane, RN

## 2018-09-11 NOTE — Discharge Summary (Signed)
Primary C/S OB Discharge Summary     Patient Name: Miranda Shah DOB: September 23, 1976 MRN: [id number]  Date of admission: 09/07/2018 Delivering MD: Essie Hart  Date of delivery: 09/08/2018 Type of delivery: Primary C/S  Newborn Data: Sex: Baby Female Circumcision: ??? Infant in NICU Live born female  Birth Weight: 4 lb 6 oz (1985 g) APGAR: 9, 9  Newborn Delivery   Birth date/time:  09/08/2018 16:32:00 Delivery type:  C-Section, Low Transverse Trial of labor:  No C-section categorization:  Primary     Feeding: breast/Pumping now due to infant in NICU Infant being discharge to home with mother in stable condition.   Admitting diagnosis: 35 WKS, ELEVATED BP Intrauterine pregnancy: [redacted]w[redacted]d     Secondary diagnosis:  Active Problems:   Elevated liver enzymes   Preeclampsia, severe, third trimester   Postpartum anemia                                Complications: None                                                              Intrapartum Procedures: cesarean: low cervical, transverse Postpartum Procedures: none Complications-Operative and Postpartum: none Augmentation: AROM and at time of delivery   History of Present Illness: Ms. AUSTYN PERRIELLO is a 42 y.o. female, G2P0010, who presents at [redacted]w[redacted]d weeks gestation. The patient has been followed at  [facility]  Her pregnancy has been complicated by:  Patient Active Problem List   Diagnosis Date Noted  . Postpartum anemia 09/09/2018  . Preeclampsia, severe, third trimester 09/08/2018  . Elevated liver enzymes 08/17/2018  . Obesity during pregnancy in third trimester 08/03/2018  . Maternal age 4+, multigravida, antepartum 04/04/2018   Hospital Course--Scheduled Cesarean: Patient was admitted on 09/07/2018 for a scheduled Primary cesarean delivery for Pre-operative Diagnosis: 1. Breech presentation 2. Preeclampsia with severe features 3. Oligohydramnios.   She was taken to the operating room, where  Dr. Mora Appl performed a Primary LTCS under spinal anesthesia, with delivery of a viable baby Female, with weight and Apgars as listed below. Infant was in good condition and remained at the patient's bedside.  The patient was taken to recovery in good condition.  Patient planned to breastfeed and pump feed.  On post-op day 1, patient was doing well, tolerating a regular diet, with Hgb of 9.6.  Throughout her stay, her physical exam was WNL, her incision was CDI, and her vital signs remained stable.  By post-op day 2, she was up ad lib, tolerating a regular diet, with good pain control with po med.  She was deemed to have received the full benefit of her hospital stay, and was discharged home in stable condition.  Contraceptive choice was undecided, but thinking about copper IUD.  BP was elevated today at 151/90, started on labetalol 100mg  BID, plan to recheck BP and if stable pt to be discharged home on labetalol with close f/u. Pt currently denies HA, vision changes, or RUQ pain. Pt denies s/sx of anemia currently.   Physical exam  Vitals:   09/10/18 2336 09/11/18 0445 09/11/18 0838 09/11/18 1123  BP: (!) 150/76 125/67 (!) 151/90 (!) 157/96  Pulse: 92 79  83 83  Resp: 17 16 18 18   Temp: 98.6 F (37 C) 98.4 F (36.9 C) 98.6 F (37 C)   TempSrc: Oral Oral Oral   SpO2: 100% 100% 94% 94%  Weight:      Height:       General: alert, cooperative and no distress Lochia: appropriate Uterine Fundus: firm Incision: Healing well with no significant drainage, No significant erythema, Dressing is clean, dry, and intact, honeycomb dressing CDI Perineum: Intact DVT Evaluation: No evidence of DVT seen on physical exam. Negative Homan's sign. No cords or calf tenderness. No significant calf/ankle edema.  Labs: Lab Results  Component Value Date   WBC 16.0 (H) 09/09/2018   HGB 9.6 (L) 09/09/2018   HCT 28.8 (L) 09/09/2018   MCV 87.5 09/09/2018   PLT 337 09/09/2018   CMP Latest Ref Rng & Units 09/08/2018   Glucose 70 - 99 mg/dL 82  BUN 6 - 20 mg/dL 12  Creatinine 1.61 - 0.96 mg/dL 0.45  Sodium 409 - 811 mmol/L 135  Potassium 3.5 - 5.1 mmol/L 3.7  Chloride 98 - 111 mmol/L 106  CO2 22 - 32 mmol/L 19(L)  Calcium 8.9 - 10.3 mg/dL 8.9  Total Protein 6.5 - 8.1 g/dL -  Total Bilirubin 0.3 - 1.2 mg/dL -  Alkaline Phos 38 - 914 U/L -  AST 15 - 41 U/L -  ALT 0 - 44 U/L -    Date of discharge: 09/11/2018 Discharge Diagnoses: Preelampsia Discharge instruction: per After Visit Summary and "Baby and Me Booklet".  After visit meds:  Allergies as of 09/11/2018      Reactions   Pecan Extract Allergy Skin Test Itching   Pecan and walnuts makes Mouth itch   Pollen Extract Itching   Itching, red eyes. sneezing      Medication List    TAKE these medications   calcium carbonate 500 MG chewable tablet Commonly known as:  TUMS - dosed in mg elemental calcium Chew 1-3 tablets by mouth daily as needed for indigestion or heartburn (depends on indigestion if takes 1-3 tablets).   FUSION PLUS Caps Take 1 capsule by mouth daily.   ibuprofen 600 MG tablet Commonly known as:  ADVIL,MOTRIN Take 1 tablet (600 mg total) by mouth every 6 (six) hours.   labetalol 100 MG tablet Commonly known as:  NORMODYNE Take 1 tablet (100 mg total) by mouth 2 (two) times daily.   NATACHEW 28-1 MG Chew Chew 1 each by mouth at bedtime.   oxyCODONE-acetaminophen 5-325 MG tablet Commonly known as:  PERCOCET/ROXICET Take 1 tablet by mouth every 4 (four) hours as needed (pain scale 4-7).            Discharge Care Instructions  (From admission, onward)         Start     Ordered   09/11/18 0000  Discharge wound care:    Comments:  Take dressing off on day 5-7 postpartum.  Report increased drainage, redness or warmth. Clean with water, let soap trickle down body. Can leave steri strips on until they fall off or take them off gently at day 10. Keep open to air, clean and dry.   09/11/18 1343           Activity:           unrestricted and pelvic rest Advance as tolerated. Pelvic rest for 6 weeks.  Diet:                routine Medications: PNV,  Ibuprofen, Colace, Iron and Percocet Postpartum contraception: IUD Copper and Undecided Condition:  Pt discharge to home in stable condition. Baby remains in NICU Anemia: Pt to take IROM BID>  Preeclampsia: Monitor BP, take labetalol 100mg  BID, baby love RN to visit home in 2 days for BP check, pt to f/u at [facility] on 11/15 for provider visit with BP check. Pt to report s/sx of HA, RUQ pain and vision changes.   Meds: Allergies as of 09/11/2018      Reactions   Pecan Extract Allergy Skin Test Itching   Pecan and walnuts makes Mouth itch   Pollen Extract Itching   Itching, red eyes. sneezing      Medication List    TAKE these medications   calcium carbonate 500 MG chewable tablet Commonly known as:  TUMS - dosed in mg elemental calcium Chew 1-3 tablets by mouth daily as needed for indigestion or heartburn (depends on indigestion if takes 1-3 tablets).   FUSION PLUS Caps Take 1 capsule by mouth daily.   ibuprofen 600 MG tablet Commonly known as:  ADVIL,MOTRIN Take 1 tablet (600 mg total) by mouth every 6 (six) hours.   labetalol 100 MG tablet Commonly known as:  NORMODYNE Take 1 tablet (100 mg total) by mouth 2 (two) times daily.   NATACHEW 28-1 MG Chew Chew 1 each by mouth at bedtime.   oxyCODONE-acetaminophen 5-325 MG tablet Commonly known as:  PERCOCET/ROXICET Take 1 tablet by mouth every 4 (four) hours as needed (pain scale 4-7).            Discharge Care Instructions  (From admission, onward)         Start     Ordered   09/11/18 0000  Discharge wound care:    Comments:  Take dressing off on day 5-7 postpartum.  Report increased drainage, redness or warmth. Clean with water, let soap trickle down body. Can leave steri strips on until they fall off or take them off gently at day 10. Keep open to air, clean and dry.    09/11/18 1343          Discharge Follow Up:  Follow-up Information    [facility] Follow up.   Specialty:  Obstetrics and Gynecology Why:  Please call the office and make a 1 week BP check or date on 11/15, and a 6 weeks postpartum f/u with possible copper IUD plalcement.  Contact information: [address] [place] [address] [phone]           [place], NP-C, CNM 09/11/2018, 1:44 PM  Dale Ralston, FNP

## 2018-09-11 NOTE — Progress Notes (Signed)
Pt BP was 151/90, per Dr Normand Sloop pt to be started on labetalol 100 BID if BP >150/90. Pt stable in room now. Plans for Discharge to home today, will monitor pts BP at lunch time then 3pm if <150/90 will discharge home on labetalol, if BP<110/60 will not prescribe labetalol for home use, Plans to update Dr Normand Sloop on pt care.

## 2018-09-12 ENCOUNTER — Ambulatory Visit (HOSPITAL_COMMUNITY): Admission: RE | Admit: 2018-09-12 | Payer: 59 | Source: Ambulatory Visit

## 2018-09-14 ENCOUNTER — Observation Stay (HOSPITAL_COMMUNITY)
Admission: AD | Admit: 2018-09-14 | Discharge: 2018-09-15 | Disposition: A | Discharge status: Home / Self Care | Payer: 59 | Source: Ambulatory Visit | Attending: Obstetrics & Gynecology | Admitting: Obstetrics & Gynecology

## 2018-09-14 ENCOUNTER — Encounter (HOSPITAL_COMMUNITY): Payer: Self-pay | Admitting: *Deleted

## 2018-09-14 DIAGNOSIS — O1093 Unspecified pre-existing hypertension complicating the puerperium: Secondary | ICD-10-CM | POA: Diagnosis present

## 2018-09-14 DIAGNOSIS — O9903 Anemia complicating the puerperium: Secondary | ICD-10-CM | POA: Diagnosis not present

## 2018-09-14 DIAGNOSIS — E669 Obesity, unspecified: Secondary | ICD-10-CM | POA: Diagnosis not present

## 2018-09-14 DIAGNOSIS — O152 Eclampsia in the puerperium: Secondary | ICD-10-CM | POA: Diagnosis not present

## 2018-09-14 DIAGNOSIS — O1495 Unspecified pre-eclampsia, complicating the puerperium: Secondary | ICD-10-CM | POA: Diagnosis present

## 2018-09-14 DIAGNOSIS — O165 Unspecified maternal hypertension, complicating the puerperium: Secondary | ICD-10-CM | POA: Diagnosis present

## 2018-09-14 LAB — COMPREHENSIVE METABOLIC PANEL
ALBUMIN: 3.1 g/dL — AB (ref 3.5–5.0)
ALK PHOS: 120 U/L (ref 38–126)
ALT: 146 U/L — ABNORMAL HIGH (ref 0–44)
ANION GAP: 9 (ref 5–15)
AST: 61 U/L — ABNORMAL HIGH (ref 15–41)
BUN: 10 mg/dL (ref 6–20)
CALCIUM: 8.8 mg/dL — AB (ref 8.9–10.3)
CO2: 24 mmol/L (ref 22–32)
Chloride: 104 mmol/L (ref 98–111)
Creatinine, Ser: 0.73 mg/dL (ref 0.44–1.00)
GFR calc non Af Amer: >60 mL/min (ref >60)
GLUCOSE: 112 mg/dL — AB (ref 70–99)
POTASSIUM: 4.3 mmol/L (ref 3.5–5.1)
Sodium: 137 mmol/L (ref 135–145)
TOTAL PROTEIN: 7.1 g/dL (ref 6.5–8.1)

## 2018-09-14 LAB — CBC
HEMATOCRIT: 30.9 % — AB (ref 36.0–46.0)
Hemoglobin: 9.9 g/dL — ABNORMAL LOW (ref 12.0–15.0)
MCH: 29 pg (ref 26.0–34.0)
MCHC: 32 g/dL (ref 30.0–36.0)
MCV: 90.6 fL (ref 80.0–100.0)
Platelets: 422 10*3/uL — ABNORMAL HIGH (ref 150–400)
RBC: 3.41 MIL/uL — ABNORMAL LOW (ref 3.87–5.11)
RDW: 15.5 % (ref 11.5–15.5)
WBC: 10.6 10*3/uL — AB (ref 4.0–10.5)
nRBC: 0 % (ref 0.0–0.2)

## 2018-09-14 LAB — URINALYSIS, ROUTINE W REFLEX MICROSCOPIC
BILIRUBIN URINE: NEGATIVE
Glucose, UA: NEGATIVE mg/dL
Hgb urine dipstick: NEGATIVE
Ketones, ur: NEGATIVE mg/dL
Leukocytes, UA: NEGATIVE
NITRITE: NEGATIVE
PROTEIN: NEGATIVE mg/dL
Specific Gravity, Urine: 1.009 (ref 1.005–1.030)
pH: 7 (ref 5.0–8.0)

## 2018-09-14 LAB — PROTEIN / CREATININE RATIO, URINE
CREATININE, URINE: 67 mg/dL
PROTEIN CREATININE RATIO: 0.18 mg/mg{creat} — AB (ref 0.00–0.15)
TOTAL PROTEIN, URINE: 12 mg/dL

## 2018-09-14 MED ORDER — NIFEDIPINE ER OSMOTIC RELEASE 30 MG PO TB24
30.0000 mg | ORAL_TABLET | Freq: Every day | ORAL | Status: DC
  Administered 2018-09-14 – 2018-09-15 (×2): 30 mg via ORAL
  Filled 2018-09-14 (×2): qty 1

## 2018-09-14 MED ORDER — LABETALOL HCL 5 MG/ML IV SOLN
40.0000 mg | INTRAVENOUS | Status: DC | PRN
  Administered 2018-09-14: 40 mg via INTRAVENOUS
  Filled 2018-09-14: qty 8

## 2018-09-14 MED ORDER — LABETALOL HCL 5 MG/ML IV SOLN
80.0000 mg | INTRAVENOUS | Status: DC | PRN
  Administered 2018-09-14: 80 mg via INTRAVENOUS
  Filled 2018-09-14: qty 16

## 2018-09-14 MED ORDER — IBUPROFEN 600 MG PO TABS
600.0000 mg | ORAL_TABLET | Freq: Four times a day (QID) | ORAL | Status: DC
  Administered 2018-09-14 – 2018-09-15 (×4): 600 mg via ORAL
  Filled 2018-09-14 (×4): qty 1

## 2018-09-14 MED ORDER — OXYCODONE-ACETAMINOPHEN 5-325 MG PO TABS
1.0000 | ORAL_TABLET | ORAL | Status: DC | PRN
  Administered 2018-09-15 (×2): 1 via ORAL
  Filled 2018-09-14 (×2): qty 1

## 2018-09-14 MED ORDER — SODIUM CHLORIDE 0.9 % IV SOLN: 250.0000 mL | INTRAVENOUS | Status: DC | PRN

## 2018-09-14 MED ORDER — SODIUM CHLORIDE 0.9% FLUSH: 3.0000 mL | INTRAVENOUS | Status: DC | PRN

## 2018-09-14 MED ORDER — LABETALOL HCL 5 MG/ML IV SOLN: 80.0000 mg | INTRAVENOUS | Status: DC | PRN

## 2018-09-14 MED ORDER — LABETALOL HCL 5 MG/ML IV SOLN: 40.0000 mg | INTRAVENOUS | Status: DC | PRN

## 2018-09-14 MED ORDER — HYDRALAZINE HCL 20 MG/ML IJ SOLN
10.0000 mg | INTRAMUSCULAR | Status: DC | PRN
  Administered 2018-09-14: 10 mg via INTRAVENOUS
  Filled 2018-09-14: qty 1

## 2018-09-14 MED ORDER — LACTATED RINGERS IV SOLN
INTRAVENOUS | Status: DC
  Administered 2018-09-14: 17:00:00 via INTRAVENOUS

## 2018-09-14 MED ORDER — LABETALOL HCL 5 MG/ML IV SOLN
20.0000 mg | INTRAVENOUS | Status: DC | PRN
  Administered 2018-09-14: 20 mg via INTRAVENOUS
  Filled 2018-09-14: qty 4

## 2018-09-14 MED ORDER — LABETALOL HCL 5 MG/ML IV SOLN: 20.0000 mg | INTRAVENOUS | Status: DC | PRN

## 2018-09-14 MED ORDER — SODIUM CHLORIDE 0.9% FLUSH
3.0000 mL | Freq: Two times a day (BID) | INTRAVENOUS | Status: DC
  Administered 2018-09-14 – 2018-09-15 (×2): 3 mL via INTRAVENOUS

## 2018-09-14 MED ORDER — PRENATAL MULTIVITAMIN CH
1.0000 | ORAL_TABLET | Freq: Every day | ORAL | Status: DC
  Administered 2018-09-15: 1 via ORAL
  Filled 2018-09-14: qty 1

## 2018-09-14 MED ORDER — HYDRALAZINE HCL 20 MG/ML IJ SOLN: 10.0000 mg | INTRAMUSCULAR | Status: DC | PRN

## 2018-09-14 NOTE — MAU Provider Note (Signed)
Chief Complaint: Hypertension   First Provider Initiated Contact with Patient 09/14/18 1709      SUBJECTIVE HPI: Miranda Shah is a 42 y.o. G2P0111 who is s/p PLTCS for severe preeclampsia at [redacted]w[redacted]d with breech presentation who presents to maternity admissions reporting BP 170s/100s at home today. She denies h/a, epigastric pain, or visual disturbances.  She is taking Labetalol 100 mg BID and reports that her BP is 120s- 130s after taking her medication but over the next 3-4 hours her BP will become higher and is 170s/100s by the time the next dose is due.  There are no other symptoms. She has not tried any other treatments. Her baby is doing well and is still currently in the NICU.  She denies any pain and reports scant lochia.    HPI  Past Medical History:  Diagnosis Date  . Anemia   . Seborrheic dermatitis    Past Surgical History:  Procedure Laterality Date  . CESAREAN SECTION N/A 09/08/2018   Procedure: CESAREAN SECTION;  Surgeon: Essie Hart, MD;  Location: [facility] BIRTHING SUITES;  Service: Obstetrics;  Laterality: N/A;  . NO PAST SURGERIES     Social History   Socioeconomic History  . Marital status: Married    Spouse name: Not on file  . Number of children: Not on file  . Years of education: Not on file  . Highest education level: Not on file  Occupational History  . Not on file  Social Needs  . Financial resource strain: Not on file  . Food insecurity:    Worry: Not on file    Inability: Not on file  . Transportation needs:    Medical: Not on file    Non-medical: Not on file  Tobacco Use  . Smoking status: Former Smoker    Packs/day: 0.50    Last attempt to quit: 08/03/2013    Years since quitting: 5.1  . Smokeless tobacco: Never Used  Substance and Sexual Activity  . Alcohol use: Not Currently    Comment: occasional  . Drug use: Not Currently    Types: Marijuana    Comment: none since college  . Sexual activity: Yes    Birth control/protection: None  Lifestyle   . Physical activity:    Days per week: Not on file    Minutes per session: Not on file  . Stress: Not on file  Relationships  . Social connections:    Talks on phone: Not on file    Gets together: Not on file    Attends religious service: Not on file    Active member of club or organization: Not on file    Attends meetings of clubs or organizations: Not on file    Relationship status: Not on file  . Intimate partner violence:    Fear of current or ex partner: Not on file    Emotionally abused: Not on file    Physically abused: Not on file    Forced sexual activity: Not on file  Other Topics Concern  . Not on file  Social History Narrative  . Not on file   No current facility-administered medications on file prior to encounter.    Current Outpatient Medications on File Prior to Encounter  Medication Sig Dispense Refill  . calcium carbonate (TUMS - DOSED IN MG ELEMENTAL CALCIUM) 500 MG chewable tablet Chew 1-3 tablets by mouth daily as needed for indigestion or heartburn (depends on indigestion if takes 1-3 tablets).     Marland Kitchen ibuprofen (  ADVIL,MOTRIN) 600 MG tablet Take 1 tablet (600 mg total) by mouth every 6 (six) hours. 30 tablet 0  . Iron-FA-B Cmp-C-Biot-Probiotic (FUSION PLUS) CAPS Take 1 capsule by mouth daily.    Marland Kitchen. labetalol (NORMODYNE) 100 MG tablet Take 1 tablet (100 mg total) by mouth 2 (two) times daily. 60 tablet 1  . oxyCODONE-acetaminophen (PERCOCET/ROXICET) 5-325 MG tablet Take 1 tablet by mouth every 4 (four) hours as needed (pain scale 4-7). 20 tablet 0  . Prenatal Vit-Fe Fum-Fe Bisg-FA (NATACHEW) 28-1 MG CHEW Chew 1 each by mouth at bedtime.      Allergies  Allergen Reactions  . Pecan Extract Allergy Skin Test Itching    Pecan and walnuts makes Mouth itch  . Pollen Extract Itching    Itching, red eyes. sneezing    ROS:  Review of Systems  Constitutional: Negative for chills, fatigue and fever.  Eyes: Negative for visual disturbance.  Respiratory: Negative  for shortness of breath.   Cardiovascular: Negative for chest pain.  Gastrointestinal: Negative for abdominal pain, nausea and vomiting.  Genitourinary: Negative for difficulty urinating, dysuria, flank pain, pelvic pain, vaginal bleeding, vaginal discharge and vaginal pain.  Neurological: Negative for dizziness and headaches.  Psychiatric/Behavioral: Negative.      I have reviewed patient's Past Medical Hx, Surgical Hx, Family Hx, Social Hx, medications and allergies.   Physical Exam   Patient Vitals for the past 24 hrs:  BP Temp Temp src Pulse Resp SpO2 Height Weight  09/14/18 1845 (!) 151/88 - - 82 - - - -  09/14/18 1830 (!) 145/83 - - 88 - - - -  09/14/18 1816 131/73 - - 84 - - - -  09/14/18 1800 136/86 - - 77 - - - -  09/14/18 1745 (!) 161/86 - - 74 - - - -  09/14/18 1730 (!) 141/81 - - 77 - - - -  09/14/18 1715 (!) 166/90 - - 84 - - - -  09/14/18 1700 (!) 160/83 - - 72 - - - -  09/14/18 1656 (!) 162/90 - - 75 - - - -  09/14/18 1645 (!) 161/101 - - 78 - - - -  09/14/18 1631 (!) 164/88 - - 84 - - - -  09/14/18 1616 (!) 165/85 - - 80 - - - -  09/14/18 1537 (!) 174/95 98.1 F (36.7 C) Oral 89 18 98 % 5\' 2"  (1.575 m) 94.3 kg   Constitutional: Well-developed, well-nourished female in no acute distress.  HEART: normal rate, heart sounds, regular rhythm RESP: normal effort, lung sounds clear and equal bilaterally GI: Abd soft, non-tender. Pos BS x 4 MS: Extremities nontender, no edema, normal ROM Neurologic: Alert and oriented x 4.  GU: Neg CVAT.   LAB RESULTS Results for orders placed or performed during the hospital encounter of 09/14/18 (from the past 24 hour(s))  CBC     Status: Abnormal   Collection Time: 09/14/18  4:08 PM  Result Value Ref Range   WBC 10.6 (H) 4.0 - 10.5 K/uL   RBC 3.41 (L) 3.87 - 5.11 MIL/uL   Hemoglobin 9.9 (L) 12.0 - 15.0 g/dL   HCT 16.130.9 (L) 09.636.0 - 04.546.0 %   MCV 90.6 80.0 - 100.0 fL   MCH 29.0 26.0 - 34.0 pg   MCHC 32.0 30.0 - 36.0 g/dL   RDW  40.915.5 81.111.5 - 91.415.5 %   Platelets 422 (H) 150 - 400 K/uL   nRBC 0.0 0.0 - 0.2 %  Comprehensive metabolic panel  Status: Abnormal   Collection Time: 09/14/18  4:08 PM  Result Value Ref Range   Sodium 137 135 - 145 mmol/L   Potassium 4.3 3.5 - 5.1 mmol/L   Chloride 104 98 - 111 mmol/L   CO2 24 22 - 32 mmol/L   Glucose, Bld 112 (H) 70 - 99 mg/dL   BUN 10 6 - 20 mg/dL   Creatinine, Ser 1.61 0.44 - 1.00 mg/dL   Calcium 8.8 (L) 8.9 - 10.3 mg/dL   Total Protein 7.1 6.5 - 8.1 g/dL   Albumin 3.1 (L) 3.5 - 5.0 g/dL   AST 61 (H) 15 - 41 U/L   ALT 146 (H) 0 - 44 U/L   Alkaline Phosphatase 120 38 - 126 U/L   Total Bilirubin <0.1 (L) 0.3 - 1.2 mg/dL   GFR calc non Af Amer >60 >60 mL/min   GFR calc Af Amer >60 >60 mL/min   Anion gap 9 5 - 15  Urinalysis, Routine w reflex microscopic     Status: Abnormal   Collection Time: 09/14/18  4:45 PM  Result Value Ref Range   Color, Urine STRAW (A) YELLOW   APPearance CLEAR CLEAR   Specific Gravity, Urine 1.009 1.005 - 1.030   pH 7.0 5.0 - 8.0   Glucose, UA NEGATIVE NEGATIVE mg/dL   Hgb urine dipstick NEGATIVE NEGATIVE   Bilirubin Urine NEGATIVE NEGATIVE   Ketones, ur NEGATIVE NEGATIVE mg/dL   Protein, ur NEGATIVE NEGATIVE mg/dL   Nitrite NEGATIVE NEGATIVE   Leukocytes, UA NEGATIVE NEGATIVE  Protein / creatinine ratio, urine     Status: Abnormal   Collection Time: 09/14/18  4:45 PM  Result Value Ref Range   Creatinine, Urine 67.00 mg/dL   Total Protein, Urine 12 mg/dL   Protein Creatinine Ratio 0.18 (H) 0.00 - 0.15 mg/mg[Cre]    --/--/A POS (11/08 1024)  IMAGING   MAU Management/MDM: Ordered labs and reviewed results.  P/C ratio wnl at 0.18, Plts normal. AST and ALT elevated but improved from 6 days ago.  Pt remains asymptomatic.  Labetalol 20 mg, 40 mg, and 80 mg IV given and Hydralazine 10 mg IV given and pt BP 140s/90s.  Consult Dr Vergie Living with assessment and findings.  Given the amount of IV medication needed to bring severe range BP  down, admission for observation and change in PO BP medication recommended. Called Dr Sallye Ober to discuss recommended admission and Dr Sallye Ober agrees with plan.  Notified pt of admission and questions answered.  ASSESSMENT 1. Preeclampsia in postpartum period     PLAN Admit to [facility] Unit Dr Sallye Ober to assume care of pt  Sharen Counter Certified Nurse-Midwife 09/14/2018  7:14 PM

## 2018-09-14 NOTE — MAU Note (Signed)
Pt post partum 09/08/2018. Preeclampsia at end of pregnancy . Sent home on Labetalol. Took B/P at home and it was 170-190/90-100.  Denies any H/A or blurred vision

## 2018-09-14 NOTE — Progress Notes (Signed)
Straight catheter for urine specimen obtained

## 2018-09-14 NOTE — H&P (Signed)
Miranda Shah is a 42 y.o. female presenting for postpartum hypertension with previous diagnosis of preeclampsia with severe features. Patient delivered via cesarean section on 09/08/18 for breech presentation.  Patient was discharged on Monday on Labetalol BID. She reports she had repeated severe range pressures at home. She was seen in the [facility] office for a blood pressure check but was normotensive at that visit. While she was here to visit her baby in NICU she stopped in MAU for BP assessment and was found to have severe range blood pressures and required IV hypertensive medication. She denies symptoms of preeclampsia and states she feels well.   OB History    Gravida  2   Para  1   Term      Preterm  1   AB  1   Living  1     SAB      TAB  1   Ectopic      Multiple      Live Births  1          Past Medical History:  Diagnosis Date  . Anemia   . Seborrheic dermatitis    Past Surgical History:  Procedure Laterality Date  . CESAREAN SECTION N/A 09/08/2018   Procedure: CESAREAN SECTION;  Surgeon: Essie HartPinn, Walda, MD;  [facility];  Service: Obstetrics;  Laterality: N/A;  . NO PAST SURGERIES     Family History: family history includes Diabetes in her mother; Hyperlipidemia in her maternal grandmother; Hypertension in her father and mother. Social History:  reports that she quit smoking about 5 years ago. She smoked 0.50 packs per day. She has never used smokeless tobacco. She reports that she drank alcohol. She reports that she has current or past drug history. Drug: Marijuana.  Review of Systems  Eyes: Negative for blurred vision.  Gastrointestinal: Negative for abdominal pain.  Neurological: Negative for headaches.  All other systems reviewed and are negative.    Vitals:   09/14/18 1800 09/14/18 1816 09/14/18 1830 09/14/18 1845  BP: 136/86 131/73 (!) 145/83 (!) 151/88  Pulse: 77 84 88 82  Resp:      Temp:      TempSrc:      SpO2:      Weight:       Height:       Results for orders placed or performed during the hospital encounter of 09/14/18 (from the past 24 hour(s))  CBC     Status: Abnormal   Collection Time: 09/14/18  4:08 PM  Result Value Ref Range   WBC 10.6 (H) 4.0 - 10.5 K/uL   RBC 3.41 (L) 3.87 - 5.11 MIL/uL   Hemoglobin 9.9 (L) 12.0 - 15.0 g/dL   HCT 08.630.9 (L) 57.836.0 - 46.946.0 %   MCV 90.6 80.0 - 100.0 fL   MCH 29.0 26.0 - 34.0 pg   MCHC 32.0 30.0 - 36.0 g/dL   RDW 62.915.5 52.811.5 - 41.315.5 %   Platelets 422 (H) 150 - 400 K/uL   nRBC 0.0 0.0 - 0.2 %  Comprehensive metabolic panel     Status: Abnormal   Collection Time: 09/14/18  4:08 PM  Result Value Ref Range   Sodium 137 135 - 145 mmol/L   Potassium 4.3 3.5 - 5.1 mmol/L   Chloride 104 98 - 111 mmol/L   CO2 24 22 - 32 mmol/L   Glucose, Bld 112 (H) 70 - 99 mg/dL   BUN 10 6 - 20 mg/dL  Creatinine, Ser 0.73 0.44 - 1.00 mg/dL   Calcium 8.8 (L) 8.9 - 10.3 mg/dL   Total Protein 7.1 6.5 - 8.1 g/dL   Albumin 3.1 (L) 3.5 - 5.0 g/dL   AST 61 (H) 15 - 41 U/L   ALT 146 (H) 0 - 44 U/L   Alkaline Phosphatase 120 38 - 126 U/L   Total Bilirubin <0.1 (L) 0.3 - 1.2 mg/dL   GFR calc non Af Amer >60 >60 mL/min   GFR calc Af Amer >60 >60 mL/min   Anion gap 9 5 - 15  Urinalysis, Routine w reflex microscopic     Status: Abnormal   Collection Time: 09/14/18  4:45 PM  Result Value Ref Range   Color, Urine STRAW (A) YELLOW   APPearance CLEAR CLEAR   Specific Gravity, Urine 1.009 1.005 - 1.030   pH 7.0 5.0 - 8.0   Glucose, UA NEGATIVE NEGATIVE mg/dL   Hgb urine dipstick NEGATIVE NEGATIVE   Bilirubin Urine NEGATIVE NEGATIVE   Ketones, ur NEGATIVE NEGATIVE mg/dL   Protein, ur NEGATIVE NEGATIVE mg/dL   Nitrite NEGATIVE NEGATIVE   Leukocytes, UA NEGATIVE NEGATIVE  Protein / creatinine ratio, urine     Status: Abnormal   Collection Time: 09/14/18  4:45 PM  Result Value Ref Range   Creatinine, Urine 67.00 mg/dL   Total Protein, Urine 12 mg/dL   Protein Creatinine Ratio 0.18 (H) 0.00 -  0.15 mg/mg[Cre]   Physical Exam  Nursing note and vitals reviewed. Constitutional: She is oriented to person, place, and time. She appears well-developed and well-nourished.  HENT:  Head: Normocephalic and atraumatic.  Eyes: Pupils are equal, round, and reactive to light.  Cardiovascular: Normal rate, regular rhythm and normal heart sounds.  Respiratory: Effort normal and breath sounds normal.  GI: Soft. There is no tenderness.  Musculoskeletal: Normal range of motion.  Neurological: She is alert and oriented to person, place, and time.  Skin: Skin is warm and dry.  Psychiatric: She has a normal mood and affect. Her behavior is normal. Judgment and thought content normal.    Incision is clean, dry, and well-approximated with steristrips in place.   Assessment/Plan: 42 y.o.  G2P1 at 6 days postpartum  Preeclampsia with severe features, labs improving since previous admission Severe range blood pressures Consulted Dr. Sallye Ober Admit to observation Procardia 30mg  XL QD, first dose now Continue to monitor patient's blood pressures closely Labetalol protocol PRN severe range blood pressures   Janeece Riggers 09/14/2018, 7:19 PM

## 2018-09-15 ENCOUNTER — Encounter (HOSPITAL_COMMUNITY)
Admission: RE | Admit: 2018-09-15 | Discharge: 2018-09-15 | Disposition: A | Discharge status: Home / Self Care | Payer: 59 | Source: Ambulatory Visit | Attending: Obstetrics & Gynecology | Admitting: Obstetrics & Gynecology

## 2018-09-15 ENCOUNTER — Other Ambulatory Visit: Payer: Self-pay

## 2018-09-15 MED ORDER — NIFEDIPINE ER OSMOTIC RELEASE 30 MG PO TB24: 30.0000 mg | ORAL_TABLET | Freq: Every day | ORAL | Status: DC

## 2018-09-15 MED ORDER — NIFEDIPINE ER OSMOTIC RELEASE 30 MG PO TB24
30.0000 mg | ORAL_TABLET | Freq: Once | ORAL | Status: AC
  Administered 2018-09-15: 30 mg via ORAL
  Filled 2018-09-15: qty 1

## 2018-09-15 MED ORDER — NIFEDIPINE ER OSMOTIC RELEASE 60 MG PO TB24: 60.0000 mg | ORAL_TABLET | Freq: Every day | ORAL | 1 refills | Status: DC

## 2018-09-15 NOTE — Progress Notes (Signed)
Hospital day # 0 pregnancy at Unknown--Readmit for PP Hypertension.  S:  Jaedynn E Yum is a 42 y.o. female presenting for postpartum hypertension with previous diagnosis of preeclampsia with severe features. Patient delivered via cesarean section on 09/08/18 for breech presentation.  Patient was discharged on Monday on Labetalol BID. She reports she had repeated severe range pressures at home. She was seen in the [facility] office for a blood pressure check but was normotensive at that visit. While she was here to visit her baby in NICU she stopped in MAU for BP assessment and was found to have severe range blood pressures and required IV hypertensive medication. She denies symptoms of preeclampsia and states she feels well. Pt denies cp, sob, n, v, d, fever, vision changes, RUQ pain no HA.   O: BP (!) 147/81 (BP Location: Right Arm)   Pulse 76   Temp 98 F (36.7 C) (Oral)   Resp 18   Ht 5\' 2"  (1.575 m)   Wt 94.3 kg   SpO2 100%   Breastfeeding? Unknown   BMI 38.04 kg/m  Physical Exam  Nursing note and vitals reviewed. Constitutional: She is oriented to person, place, and time. She appears well-developed and well-nourished.  HENT:  Head: Normocephalic and atraumatic.  Eyes: Pupils are equal, round, and reactive to light.  Cardiovascular: Normal rate, regular rhythm and normal heart sounds.  Respiratory: Effort normal and breath sounds normal.  GI: Soft. There is no tenderness.  Musculoskeletal: Normal range of motion.  Neurological: She is alert and oriented to person, place, and time.  Skin: Skin is warm and dry.  Psychiatric: She has a normal mood and affect. Her behavior is normal. Judgment and thought content normal.  Extremities: extremities normal, atraumatic, no cyanosis or edema, Homans sign is negative, no sign of DVT and no significant edema and no signs of DVT, Patellar DTR +2, no clonus.          Labs:  pcr 0.18, creatinine 0.73, plat 422, hgb 9.9, hct 30.9  AST: (x8days ago)  184, (x7days ago) 178, (X1 day ago) 61 ALT:  (x8days ago) 294, (x7days ago) 269, (X1 day ago) 146       Meds:  Scheduled Meds: . ibuprofen  600 mg Oral Q6H  . NIFEdipine  30 mg Oral Daily  . prenatal multivitamin  1 tablet Oral Q1200  . sodium chloride flush  3 mL Intravenous Q12H   Continuous Infusions: . sodium chloride     PRN Meds:.sodium chloride, labetalol **AND** labetalol **AND** labetalol **AND** hydrALAZINE **AND** Measure blood pressure, oxyCODONE-acetaminophen, sodium chloride flush  A: Rockell E Levins is a 42 y.o. female presenting for postpartum hypertension with previous diagnosis of preeclampsia with severe features. Patient delivered via cesarean section on 09/08/18 for breech presentation. Pt stable with no s/sx of preeclampsia. Currently on 30mg  procardia daily last given at 1030 am BP pre med was 147/81.  P: Continue current plan of care      Plan for discharge to home this afternoon as [place] as BP remains less than 150/90, will give an additional 30mg                 procardia if bp>150/90 and monitor BP, pt to be discharge home this afternoon.        MD Rivard aware of pt status verbalized agreement with POC.  Dale DurhamMONTANA, Erva Koke CNM, MSN 09/15/2018 9:38 AM

## 2018-09-15 NOTE — Lactation Note (Signed)
This note was copied from a baby's chart. Lactation Consultation Note  Patient Name: Miranda Shah  Mom PP readmit for blood pressure.  Mom out 7 days PP with infant in NICU.  Mom reports she does not feel like her milk has really come in.  Mom reports her sister breastfed and she remembers how full her breasts would get in the beginning and she has not felt that or felt any letdown.  Observed moms flanges.  Appears to be skin inside flanges.  Mom reports she has been using these flanges since the beginning.  Has spectra pump for home use.  Mom reports never gets much more than a full bottle when she pumps from both breasts. (3oz) Pumping 8 times day for 15 minutes.  Urged mom to increase pumping 10 times day and add massage and hand expression to pumping.  Urged mom to use larger flanges 27mm flanges.  After a few minutes of pumping gave mom 30 mm flange on right.  Nipple really appeared to swell up with pumping. Urged mom to watch the video [employer] on pumping to show how to use her hands to get more milk. Discussed hands free bra and the use of power pumping.  Urged mom to call as needed for breastfeeding/pumping assistance.   Maternal Data    Feeding Feeding Type: Breast Milk Nipple Type: Dr. Levert FeinsteinBrowns Ultra [facility]  [facility]ATCH Score                   Interventions    Lactation Tools Discussed/Used     Consult Status      [name] [name], 5:12 PM

## 2018-09-15 NOTE — Progress Notes (Signed)
Discharge instructions and prescriptions given to pt. Discussed signs and symptoms to report to MD, upcoming appointments, meds, and BP monitoring.  Pt verbalizes understanding and has no questions or concerns at this time. Pt discharged from hospital in stable condition.

## 2018-09-15 NOTE — Discharge Summary (Addendum)
Postpartum Readmit Discharge Summary     Patient Name: Miranda Shah DOB: [birth year] MRN: [id number]  Date of admission: 09/14/2018 Delivering MD: This patient has no babies on file. Date of Admission: 09/14/2018 Type of delivery: C/S for breech and preeclampsia with SF on 09/08/2018.  Admitting diagnosis: PP, ELEVATED BP Intrauterine pregnancy: Unknown     Secondary diagnosis:  Active Problems:   Postpartum hypertension   Preeclampsia in postpartum period  History of Present Illness: Ms. Miranda Shah is a 43 y.o. female, G2P0111, who presents at Unknown weeks gestation. The patient has been followed at  [facility] and Gynecology  Her pregnancy has been complicated by:  Patient Active Problem List   Diagnosis Date Noted  . Preeclampsia in postpartum period 09/14/2018  . Postpartum anemia 09/09/2018  . Preeclampsia, severe, third trimester 09/08/2018  . Elevated liver enzymes 08/17/2018  . Postpartum hypertension 08/16/2018  . Obesity during pregnancy in third trimester 08/03/2018  . Maternal age 75+, multigravida, antepartum 04/04/2018   Hospital course:   Hospital day # 0 pregnancy at Unknown--Readmit for PP Hypertension.  S:  Miranda Shah a 42 y.o.femalepresenting for postpartum hypertension with previous diagnosis of preeclampsia with severe features. Patient delivered via cesarean section on 09/08/18 for breech presentation.  Patient was discharged on Monday on Labetalol BID. She reports she had repeated severe range pressures at home. She was seen in the [facility] office for a blood pressure check but was normotensive at that visit. While she was here to visit her baby in NICU she stopped in MAU for BP assessment and was found to have severe range blood pressures and required IV hypertensive medication. Pt was admitted for 24 hour observation. She denies symptoms of preeclampsia and states she feels well. Pt denies cp, sob, n, v, d, fever, vision changes,  RUQ pain no HA. BP today was 140/94 post being given procardia 30mg  , another 30 mg added to the pt regimen. Pt to be discharged to home on 60mg  procardia daily with close follow up. Pt denies having s/sx of anemia with post HGB of 9.9.  Discharge BP was 116/71 and pt is stable.   Physical exam  Vitals:   09/15/18 0519 09/15/18 0755 09/15/18 1154 09/15/18 1557  BP: 120/66 (!) 147/81 (!) 140/94 116/71  Pulse: 66 76 (!) 104 94  Resp: 18 18 18 16   Temp: 97.9 F (36.6 C) 98 F (36.7 C) 98.2 F (36.8 C) 97.8 F (36.6 C)  TempSrc: Oral Oral Oral Oral  SpO2: 98% 100% 99% 100%  Weight:      Height:       General: alert, cooperative and no distress Lochia: appropriate Uterine Fundus: firm Incision: Healing well with no significant drainage, No significant erythema, Dressing is clean, dry, and intact, steri strips in place and intact, clean and dry.  Perineum: Intact DVT Evaluation: No evidence of DVT seen on physical exam. Negative Homan's sign. No cords or calf tenderness. No significant calf/ankle edema.  Labs: Lab Results  Component Value Date   WBC 10.6 (H) 09/14/2018   HGB 9.9 (L) 09/14/2018   HCT 30.9 (L) 09/14/2018   MCV 90.6 09/14/2018   PLT 422 (H) 09/14/2018   CMP Latest Ref Rng & Units 09/14/2018  Glucose 70 - 99 mg/dL 147(W)  BUN 6 - 20 mg/dL 10  Creatinine 2.95 - 6.21 mg/dL 3.08  Sodium 657 - 846 mmol/L 137  Potassium 3.5 - 5.1 mmol/L 4.3  Chloride 98 - 111  mmol/L 104  CO2 22 - 32 mmol/L 24  Calcium 8.9 - 10.3 mg/dL 1.6(X)  Total Protein 6.5 - 8.1 g/dL 7.1  Total Bilirubin 0.3 - 1.2 mg/dL <0.9(U)  Alkaline Phos 38 - 126 U/L 120  AST 15 - 41 U/L 61(H)  ALT 0 - 44 U/L 146(H)    Date of discharge: 09/15/2018 Discharge Diagnoses: Postpartum Hypertension.  Discharge instruction: per After Visit Summary and "Baby and Me Booklet".  After visit meds:  Allergies as of 09/15/2018      Reactions   Pecan Extract Allergy Skin Test Itching   Pecan and walnuts makes  Mouth itch   Pollen Extract Itching   Itching, red eyes. sneezing      Medication List    STOP taking these medications   ibuprofen 600 MG tablet Commonly known as:  ADVIL,MOTRIN   labetalol 100 MG tablet Commonly known as:  NORMODYNE     TAKE these medications   calcium carbonate 500 MG chewable tablet Commonly known as:  TUMS - dosed in mg elemental calcium Chew 1-3 tablets by mouth daily as needed for indigestion or heartburn (depends on indigestion if takes 1-3 tablets).   FUSION PLUS Caps Take 1 capsule by mouth daily.   NATACHEW 28-1 MG Chew Chew 1 each by mouth at bedtime.   NIFEdipine 60 MG 24 hr tablet Commonly known as:  PROCARDIA XL/NIFEDICAL XL Take 1 tablet (60 mg total) by mouth daily.   oxyCODONE-acetaminophen 5-325 MG tablet Commonly known as:  PERCOCET/ROXICET Take 1 tablet by mouth every 4 (four) hours as needed (pain scale 4-7).      Activity:           unrestricted and pelvic rest Advance as tolerated. Pelvic rest for 6 weeks.  Diet:                routine Medications: PNV, Colace, Iron and Percocet Condition:  Pt discharge to home in stable HTN: Baby love to visit home for BP check, pt to make appointment for provider and 1 week f/u with [facility], monitor BP if >150/90 report, report s/sx of such as HA that will not go away with tylenol, and RUQ pain with vision changes. If you feel dizzy when going from sitting to standing, please check you BP, and do not take procardia if BP <120/80 Anemia: IRON.   Meds: Allergies as of 09/15/2018      Reactions   Pecan Extract Allergy Skin Test Itching   Pecan and walnuts makes Mouth itch   Pollen Extract Itching   Itching, red eyes. sneezing      Medication List    STOP taking these medications   ibuprofen 600 MG tablet Commonly known as:  ADVIL,MOTRIN   labetalol 100 MG tablet Commonly known as:  NORMODYNE     TAKE these medications   calcium carbonate 500 MG chewable tablet Commonly known as:   TUMS - dosed in mg elemental calcium Chew 1-3 tablets by mouth daily as needed for indigestion or heartburn (depends on indigestion if takes 1-3 tablets).   FUSION PLUS Caps Take 1 capsule by mouth daily.   NATACHEW 28-1 MG Chew Chew 1 each by mouth at bedtime.   NIFEdipine 60 MG 24 hr tablet Commonly known as:  PROCARDIA XL/NIFEDICAL XL Take 1 tablet (60 mg total) by mouth daily.   oxyCODONE-acetaminophen 5-325 MG tablet Commonly known as:  PERCOCET/ROXICET Take 1 tablet by mouth every 4 (four) hours as needed (pain scale 4-7).  Discharge Follow Up:  Follow-up Information    [facility].   Specialty:  Obstetrics and Gynecology Why:  1 week appointment please call [facility] for a BP and provider check.  Contact information: [address] [place] [phone]           SecorJade Adisa Litt, NP-C, CNM 09/15/2018, 4:40 PM  Dale DurhamMONTANA, Damarkus Balis, FNP

## 2018-09-16 ENCOUNTER — Inpatient Hospital Stay (HOSPITAL_COMMUNITY): Admission: RE | Admit: 2018-09-16 | Payer: 59 | Source: Ambulatory Visit | Admitting: Obstetrics & Gynecology

## 2018-09-25 ENCOUNTER — Ambulatory Visit: Payer: Self-pay

## 2018-09-25 NOTE — Lactation Note (Signed)
This note was copied from a baby'Shah chart. Lactation Consultation Note  Patient Name: Miranda Shah GEXBM'WToday'Shah Date: 09/25/2018  Pecola LeisureBaby is 592 weeks old and 38.2 weeks.  Mom and baby roomed in last night and will be discharged later today.  Mom states baby is latching to breast some.  She has only been pumping 5 times per day and struggling with supply.  She obtains 90 mls with first pump of the day but then it decreases to 15 mls or less.  Mom will attempt to increase pumping to 8+ a day along with power pumping.  Recommended outpatient lactation appointment for feeding assist/assessment.   Maternal Data    Feeding Feeding Type: Formula Nipple Type: [facility] Lever[facility]instein[facility]rowns [facility]  LATCH Score                   Interventions    Lactation Tools Discussed/Used     Consult Status      Miranda Shah, Miranda Shah 09/25/2018, 11:03 AM

## 2018-11-11 ENCOUNTER — Encounter (HOSPITAL_COMMUNITY): Payer: Self-pay | Admitting: Student

## 2018-11-11 ENCOUNTER — Inpatient Hospital Stay (HOSPITAL_COMMUNITY)
Admission: AD | Admit: 2018-11-11 | Discharge: 2018-11-11 | Disposition: A | Discharge status: Home / Self Care | Payer: 59 | Source: Ambulatory Visit | Attending: Obstetrics and Gynecology | Admitting: Obstetrics and Gynecology

## 2018-11-11 DIAGNOSIS — Z3202 Encounter for pregnancy test, result negative: Secondary | ICD-10-CM | POA: Insufficient documentation

## 2018-11-11 DIAGNOSIS — K219 Gastro-esophageal reflux disease without esophagitis: Secondary | ICD-10-CM | POA: Diagnosis not present

## 2018-11-11 DIAGNOSIS — K829 Disease of gallbladder, unspecified: Secondary | ICD-10-CM | POA: Diagnosis not present

## 2018-11-11 DIAGNOSIS — R079 Chest pain, unspecified: Secondary | ICD-10-CM | POA: Insufficient documentation

## 2018-11-11 DIAGNOSIS — Z87891 Personal history of nicotine dependence: Secondary | ICD-10-CM | POA: Diagnosis not present

## 2018-11-11 DIAGNOSIS — K8020 Calculus of gallbladder without cholecystitis without obstruction: Secondary | ICD-10-CM | POA: Insufficient documentation

## 2018-11-11 HISTORY — DX: Gestational (pregnancy-induced) hypertension without significant proteinuria, unspecified trimester: O13.9

## 2018-11-11 LAB — URINALYSIS, ROUTINE W REFLEX MICROSCOPIC
Bilirubin Urine: NEGATIVE
GLUCOSE, UA: NEGATIVE mg/dL
Hgb urine dipstick: NEGATIVE
KETONES UR: NEGATIVE mg/dL
LEUKOCYTES UA: NEGATIVE
Nitrite: NEGATIVE
PH: 8 (ref 5.0–8.0)
PROTEIN: NEGATIVE mg/dL
Specific Gravity, Urine: 1.003 — ABNORMAL LOW (ref 1.005–1.030)

## 2018-11-11 LAB — CBC
HEMATOCRIT: 37.4 % (ref 36.0–46.0)
HEMOGLOBIN: 12.2 g/dL (ref 12.0–15.0)
MCH: 28.2 pg (ref 26.0–34.0)
MCHC: 32.6 g/dL (ref 30.0–36.0)
MCV: 86.4 fL (ref 80.0–100.0)
Platelets: 357 10*3/uL (ref 150–400)
RBC: 4.33 MIL/uL (ref 3.87–5.11)
RDW: 14 % (ref 11.5–15.5)
WBC: 15.4 10*3/uL — ABNORMAL HIGH (ref 4.0–10.5)
nRBC: 0 % (ref 0.0–0.2)

## 2018-11-11 LAB — LIPASE, BLOOD: Lipase: 28 U/L (ref 11–51)

## 2018-11-11 LAB — COMPREHENSIVE METABOLIC PANEL
ALBUMIN: 4.2 g/dL (ref 3.5–5.0)
ALK PHOS: 80 U/L (ref 38–126)
ALT: 25 U/L (ref 0–44)
AST: 38 U/L (ref 15–41)
Anion gap: 9 (ref 5–15)
BILIRUBIN TOTAL: 0.6 mg/dL (ref 0.3–1.2)
BUN: 14 mg/dL (ref 6–20)
CO2: 27 mmol/L (ref 22–32)
Calcium: 9.7 mg/dL (ref 8.9–10.3)
Chloride: 101 mmol/L (ref 98–111)
Creatinine, Ser: 0.83 mg/dL (ref 0.44–1.00)
GFR calc Af Amer: >60 mL/min (ref >60)
GLUCOSE: 107 mg/dL — AB (ref 70–99)
Potassium: 3.6 mmol/L (ref 3.5–5.1)
Sodium: 137 mmol/L (ref 135–145)
Total Protein: 8.3 g/dL — ABNORMAL HIGH (ref 6.5–8.1)

## 2018-11-11 LAB — POCT PREGNANCY, URINE: Preg Test, Ur: NEGATIVE

## 2018-11-11 LAB — TROPONIN I: Troponin I: <0.03 ng/mL (ref <0.03)

## 2018-11-11 MED ORDER — LIDOCAINE VISCOUS HCL 2 % MT SOLN
15.0000 mL | Freq: Once | OROMUCOSAL | Status: AC
  Administered 2018-11-11: 15 mL via ORAL
  Filled 2018-11-11: qty 15

## 2018-11-11 MED ORDER — FAMOTIDINE 20 MG PO TABS
20.0000 mg | ORAL_TABLET | Freq: Once | ORAL | Status: AC
  Administered 2018-11-11: 20 mg via ORAL
  Filled 2018-11-11: qty 1

## 2018-11-11 MED ORDER — OXYCODONE-ACETAMINOPHEN 5-325 MG PO TABS
1.0000 | ORAL_TABLET | Freq: Once | ORAL | Status: AC
  Administered 2018-11-11: 1 via ORAL
  Filled 2018-11-11: qty 1

## 2018-11-11 MED ORDER — CALCIUM CARBONATE ANTACID 500 MG PO CHEW
1.0000 | CHEWABLE_TABLET | Freq: Once | ORAL | Status: AC
  Administered 2018-11-11: 200 mg via ORAL
  Filled 2018-11-11: qty 1

## 2018-11-11 MED ORDER — FAMOTIDINE 20 MG PO TABS: 20.0000 mg | ORAL_TABLET | Freq: Every day | ORAL | 0 refills | Status: DC

## 2018-11-11 MED ORDER — CALCIUM CARBONATE ANTACID 500 MG PO CHEW: 1.0000 | CHEWABLE_TABLET | Freq: Once | ORAL | 0 refills | Status: AC

## 2018-11-11 MED ORDER — ALUM & MAG HYDROXIDE-SIMETH 200-200-20 MG/5ML PO SUSP
30.0000 mL | Freq: Once | ORAL | Status: AC
  Administered 2018-11-11: 30 mL via ORAL
  Filled 2018-11-11: qty 30

## 2018-11-11 NOTE — Discharge Instructions (Signed)
Gallbladder Eating Plan If you have a gallbladder condition, you may have trouble digesting fats. Eating a low-fat diet can help reduce your symptoms, and may be helpful before and after having surgery to remove your gallbladder (cholecystectomy). Your health care provider may recommend that you work with a diet and nutrition specialist (dietitian) to help you reduce the amount of fat in your diet. What are tips for following this plan? General guidelines  Limit your fat intake to less than 30% of your total daily calories. If you eat around 1,800 calories each day, this is less than 60 grams (g) of fat per day.  Fat is an important part of a healthy diet. Eating a low-fat diet can make it hard to maintain a healthy body weight. Ask your dietitian how much fat, calories, and other nutrients you need each day.  Eat small, frequent meals throughout the day instead of three large meals.  Drink at least 8-10 cups of fluid a day. Drink enough fluid to keep your urine clear or pale yellow.  Limit alcohol intake to no more than 1 drink a day for nonpregnant women and 2 drinks a day for men. One drink equals 12 oz of beer, 5 oz of wine, or 1 oz of hard liquor. Reading food labels  Check Nutrition Facts on food labels for the amount of fat per serving. Choose foods with less than 3 grams of fat per serving. Shopping  Choose nonfat and low-fat healthy foods. Look for the words "nonfat," "low fat," or "fat free."  Avoid buying processed or prepackaged foods. Cooking  Cook using low-fat methods, such as baking, broiling, grilling, or boiling.  Cook with small amounts of healthy fats, such as olive oil, grapeseed oil, canola oil, or sunflower oil. What foods are recommended?   All fresh, frozen, or canned fruits and vegetables.  Whole grains.  Low-fat or non-fat (skim) milk and yogurt.  Lean meat, skinless poultry, fish, eggs, and beans.  Low-fat protein supplement powders or  drinks.  Spices and herbs. What foods are not recommended?  High-fat foods. These include baked goods, fast food, fatty cuts of meat, ice cream, french toast, sweet rolls, pizza, cheese bread, foods covered with butter, creamy sauces, or cheese.  Fried foods. These include french fries, tempura, battered fish, breaded chicken, fried breads, and sweets.  Foods with strong odors.  Foods that cause bloating and gas. Summary  A low-fat diet can be helpful if you have a gallbladder condition, or before and after gallbladder surgery.  Limit your fat intake to less than 30% of your total daily calories. This is about 60 g of fat if you eat 1,800 calories each day.  Eat small, frequent meals throughout the day instead of three large meals. This information is not intended to replace advice given to you by your health care provider. Make sure you discuss any questions you have with your health care provider. Document Released: 10/23/2013 Document Revised: 11/25/2016 Document Reviewed: 11/25/2016 Elsevier Interactive Patient Education  2019 [employer].  

## 2018-11-11 NOTE — MAU Note (Signed)
Pt delivered via c-section on 11/8.Pt stated she started having pain in her middle of her chest and pain under her right and left breast. Stated she was told she had gallstones. Pain started today agter she ate spicy breaded chicken patty. Felt a little nauseated with the pain.

## 2018-11-11 NOTE — MAU Provider Note (Addendum)
Chief Complaint: Chest Pain   First Provider Initiated Contact with Patient 11/11/18 1758     SUBJECTIVE HPI: Miranda Shah is a 43 y.o. non pregnant female who presents to Maternity Admissions reporting epigastric & chest pain. Hx of gallstones that were diagnosed with recent pregnancy.  Current symptoms began an hour prior to arrival. States about 20 minutes prior to onset of pain, she ate 2 spicy chicken sandwiches. Describes aching pain, epigastric & substernal, that she rates 9/10. Denies SOB, palpitations, n/v, or fever/chills.  Was supposed to be referred to general surgery for gallstones but states it hasn't happened yet. Does not have PCP. Goes to [employer] for ob/gyn; she is 8+ weeks s/p c/section.   Location: epigastrium & chest Quality: aching, dull Severity: 9/10 on pain scale Duration: 1 hour Timing: constant Modifying factors: none Associated signs and symptoms: none  Past Medical History:  Diagnosis Date  . Anemia   . Gestational hypertension   . Seborrheic dermatitis    OB History  Gravida Para Term Preterm AB Living  2 1   1 1 1   SAB TAB Ectopic Multiple Live Births    1     1    # Outcome Date GA Lbr Len/2nd Weight Sex Delivery Anes PTL Lv  2 Preterm 09/08/18 [redacted]w[redacted]d   M CS-Unspec   LIV  1 TAB            Past Surgical History:  Procedure Laterality Date  . CESAREAN SECTION N/A 09/08/2018   Procedure: CESAREAN SECTION;  Surgeon: Essie Hart, MD;  Location: [facility];  Service: Obstetrics;  Laterality: N/A;   Social History   Socioeconomic History  . Marital status: Married    Spouse name: Not on file  . Number of children: Not on file  . Years of education: Not on file  . Highest education level: Not on file  Occupational History  . Not on file  Social Needs  . Financial resource strain: Not on file  . Food insecurity:    Worry: Not on file    Inability: Not on file  . Transportation needs:    Medical: Not on file    Non-medical: Not on file   Tobacco Use  . Smoking status: Former Smoker    Packs/day: 0.50    Last attempt to quit: 08/03/2013    Years since quitting: 5.2  . Smokeless tobacco: Never Used  Substance and Sexual Activity  . Alcohol use: Not Currently    Comment: occasional  . Drug use: Not Currently    Types: Marijuana    Comment: none since college  . Sexual activity: Yes    Birth control/protection: None  Lifestyle  . Physical activity:    Days per week: Not on file    Minutes per session: Not on file  . Stress: Not on file  Relationships  . Social connections:    Talks on phone: Not on file    Gets together: Not on file    Attends religious service: Not on file    Active member of club or organization: Not on file    Attends meetings of clubs or organizations: Not on file    Relationship status: Not on file  . Intimate partner violence:    Fear of current or ex partner: Not on file    Emotionally abused: Not on file    Physically abused: Not on file    Forced sexual activity: Not on file  Other Topics Concern  .  Not on file  Social History Narrative  . Not on file   Family History  Problem Relation Age of Onset  . Diabetes Mother   . Hypertension Mother   . Hypertension Father   . Hyperlipidemia Maternal Grandmother    No current facility-administered medications on file prior to encounter.    Current Outpatient Medications on File Prior to Encounter  Medication Sig Dispense Refill  . calcium carbonate (TUMS - DOSED IN MG ELEMENTAL CALCIUM) 500 MG chewable tablet Chew 1-3 tablets by mouth daily as needed for indigestion or heartburn (depends on indigestion if takes 1-3 tablets).     . Iron-FA-B Cmp-C-Biot-Probiotic (FUSION PLUS) CAPS Take 1 capsule by mouth daily.    . Prenatal Vit-Fe Fum-Fe Bisg-FA (NATACHEW) 28-1 MG CHEW Chew 1 each by mouth at bedtime.     Marland Kitchen. NIFEdipine (PROCARDIA XL) 60 MG 24 hr tablet Take 1 tablet (60 mg total) by mouth daily. 30 tablet 1  . oxyCODONE-acetaminophen  (PERCOCET/ROXICET) 5-325 MG tablet Take 1 tablet by mouth every 4 (four) hours as needed (pain scale 4-7). 20 tablet 0   Allergies  Allergen Reactions  . Pecan Extract Allergy Skin Test Itching    Pecan and walnuts makes Mouth itch  . Pollen Extract Itching    Itching, red eyes. sneezing    I have reviewed patient's Past Medical Hx, Surgical Hx, Family Hx, Social Hx, medications and allergies.   Review of Systems  Constitutional: Negative.   Respiratory: Negative.   Cardiovascular: Positive for chest pain. Negative for palpitations.  Gastrointestinal: Positive for abdominal pain. Negative for nausea and vomiting.    OBJECTIVE Patient Vitals for the past 24 hrs:  BP Temp Pulse Resp SpO2 Height Weight  11/11/18 1809 (!) 145/78 - 80 - - - -  11/11/18 1731 (!) 152/84 98 F (36.7 C) 86 18 100 % 5\' 2"  (1.575 m) 93 kg   Constitutional: Well-developed, well-nourished female in no acute distress.  Cardiovascular: normal rate & rhythm, no murmur Respiratory: normal rate and effort. Lung sounds clear throughout GI: Abd soft, non-tender, Pos BS x 4. No guarding or rebound tenderness MS: Extremities nontender, no edema, normal ROM Neurologic: Alert and oriented x 4.      LAB RESULTS Results for orders placed or performed during the hospital encounter of 11/11/18 (from the past 24 hour(s))  Urinalysis, Routine w reflex microscopic     Status: Abnormal   Collection Time: 11/11/18  5:35 PM  Result Value Ref Range   Color, Urine COLORLESS (A) YELLOW   APPearance CLEAR CLEAR   Specific Gravity, Urine 1.003 (L) 1.005 - 1.030   pH 8.0 5.0 - 8.0   Glucose, UA NEGATIVE NEGATIVE mg/dL   Hgb urine dipstick NEGATIVE NEGATIVE   Bilirubin Urine NEGATIVE NEGATIVE   Ketones, ur NEGATIVE NEGATIVE mg/dL   Protein, ur NEGATIVE NEGATIVE mg/dL   Nitrite NEGATIVE NEGATIVE   Leukocytes, UA NEGATIVE NEGATIVE  Pregnancy, urine POC     Status: None   Collection Time: 11/11/18  5:37 PM  Result Value Ref  Range   Preg Test, Ur NEGATIVE NEGATIVE  Comprehensive metabolic panel     Status: Abnormal   Collection Time: 11/11/18  6:27 PM  Result Value Ref Range   Sodium 137 135 - 145 mmol/L   Potassium 3.6 3.5 - 5.1 mmol/L   Chloride 101 98 - 111 mmol/L   CO2 27 22 - 32 mmol/L   Glucose, Bld 107 (H) 70 - 99 mg/dL   BUN  14 6 - 20 mg/dL   Creatinine, Ser 9.21 0.44 - 1.00 mg/dL   Calcium 9.7 8.9 - 19.4 mg/dL   Total Protein 8.3 (H) 6.5 - 8.1 g/dL   Albumin 4.2 3.5 - 5.0 g/dL   AST 38 15 - 41 U/L   ALT 25 0 - 44 U/L   Alkaline Phosphatase 80 38 - 126 U/L   Total Bilirubin 0.6 0.3 - 1.2 mg/dL   GFR calc non Af Amer >60 >60 mL/min   GFR calc Af Amer >60 >60 mL/min   Anion gap 9 5 - 15  Lipase, blood     Status: None   Collection Time: 11/11/18  6:27 PM  Result Value Ref Range   Lipase 28 11 - 51 U/L  CBC     Status: Abnormal   Collection Time: 11/11/18  6:27 PM  Result Value Ref Range   WBC 15.4 (H) 4.0 - 10.5 K/uL   RBC 4.33 3.87 - 5.11 MIL/uL   Hemoglobin 12.2 12.0 - 15.0 g/dL   HCT 17.4 08.1 - 44.8 %   MCV 86.4 80.0 - 100.0 fL   MCH 28.2 26.0 - 34.0 pg   MCHC 32.6 30.0 - 36.0 g/dL   RDW 18.5 63.1 - 49.7 %   Platelets 357 150 - 400 K/uL   nRBC 0.0 0.0 - 0.2 %  Troponin I - ONCE - STAT     Status: None   Collection Time: 11/11/18  6:27 PM  Result Value Ref Range   Troponin I <0.03 <0.03 ng/mL    IMAGING No results found.  MAU COURSE Orders Placed This Encounter  Procedures  . Urinalysis, Routine w reflex microscopic  . Comprehensive metabolic panel  . Lipase, blood  . CBC  . Troponin I - ONCE - STAT  . Pregnancy, urine POC  . EKG 12-Lead   Meds ordered this encounter  Medications  . AND Linked Order Group   . alum & mag hydroxide-simeth (MAALOX/MYLANTA) 200-200-20 MG/5ML suspension 30 mL   . lidocaine (XYLOCAINE) 2 % viscous mouth solution 15 mL    MDM UPT negative EKG NSR CBC, CMP, lipase, & troponin ordered Maalox + viscous lidocaine given with moderate  relief in symptoms  Labs pending. Care turned over to [facility], CNM  Judeth Horn, NP 11/11/2018  8:03 PM  No concerning findings on physical exam Pain relieved by GI cocktail   Meds ordered this encounter  Medications  . AND Linked Order Group   . alum & mag hydroxide-simeth (MAALOX/MYLANTA) 200-200-20 MG/5ML suspension 30 mL   . lidocaine (XYLOCAINE) 2 % viscous mouth solution 15 mL  . famotidine (PEPCID) tablet 20 mg  . calcium carbonate (TUMS - dosed in mg elemental calcium) chewable tablet 200 mg of elemental calcium  . oxyCODONE-acetaminophen (PERCOCET/ROXICET) 5-325 MG per tablet 1 tablet  . calcium carbonate (TUMS - DOSED IN MG ELEMENTAL CALCIUM) 500 MG chewable tablet    Sig: Chew 1 tablet (200 mg of elemental calcium total) by mouth once for 1 dose.    Dispense:  1 tablet    Refill:  0    Order Specific Question:   Supervising Provider    Answer:   Reva Bores [[id number]]  . famotidine (PEPCID) 20 MG tablet    Sig: Take 1 tablet (20 mg total) by mouth at bedtime.    Dispense:  30 tablet    Refill:  0    Order Specific Question:   Supervising Provider  Answer:   Samara SnideRATT, TANYA S [[id number]]    A/P --43 y.o. G2P0111 postpartum patient --Possible gallbladder attack with episode of GERD --Pain relieved with treatments administered in MAU --Discharge home in stable condition  F/U: Patient to pursue surgical consult for gallbladder as previously advised by OB.  Clayton BiblesSamantha Leita Lindbloom, CNM 11/11/18  10:35 PM

## 2019-10-22 ENCOUNTER — Ambulatory Visit: Payer: Self-pay | Admitting: Nurse Practitioner

## 2019-10-22 ENCOUNTER — Encounter: Payer: Self-pay | Admitting: Nurse Practitioner

## 2019-10-22 ENCOUNTER — Other Ambulatory Visit (HOSPITAL_COMMUNITY)
Admission: RE | Admit: 2019-10-22 | Discharge: 2019-10-22 | Disposition: A | Discharge status: Home / Self Care | Payer: 59 | Source: Ambulatory Visit | Attending: Nurse Practitioner | Admitting: Nurse Practitioner

## 2019-10-22 ENCOUNTER — Other Ambulatory Visit: Payer: Self-pay

## 2019-10-22 VITALS — BP 110/70 | HR 100 | Temp 98.3°F | Ht 63.6 in | Wt 224.4 lb

## 2019-10-22 DIAGNOSIS — Z831 Family history of other infectious and parasitic diseases: Secondary | ICD-10-CM

## 2019-10-22 DIAGNOSIS — Z86010 Personal history of colonic polyps: Secondary | ICD-10-CM

## 2019-10-22 DIAGNOSIS — Z13228 Encounter for screening for other metabolic disorders: Secondary | ICD-10-CM

## 2019-10-22 DIAGNOSIS — E669 Obesity, unspecified: Secondary | ICD-10-CM

## 2019-10-22 DIAGNOSIS — Z6839 Body mass index (BMI) 39.0-39.9, adult: Secondary | ICD-10-CM

## 2019-10-22 DIAGNOSIS — R748 Abnormal levels of other serum enzymes: Secondary | ICD-10-CM

## 2019-10-22 DIAGNOSIS — N760 Acute vaginitis: Secondary | ICD-10-CM | POA: Insufficient documentation

## 2019-10-22 DIAGNOSIS — Z862 Personal history of diseases of the blood and blood-forming organs and certain disorders involving the immune mechanism: Secondary | ICD-10-CM

## 2019-10-22 NOTE — Progress Notes (Signed)
This visit occurred during the SARS-CoV-2 public health emergency.  Safety protocols were in place, including screening questions prior to the visit, additional usage of staff PPE, and extensive cleaning of exam room while observing appropriate contact time as indicated for disinfecting solutions.  Subjective:     Patient ID: Miranda Shah , female    DOB: 1976-05-18 , 43 y.o.   MRN: [id number]   Chief Complaint  Patient presents with  . Establish Care  . abnormal labs    patient had a couple abnormal labs     HPI  Here to establish care was referred by Starling Manns (who is a patient of mine).  She has not seen Primary Care in quite some time at [facility] off [address] with Rosanne Gutting.   Was with [facility] on November 13th - PAP and mammogram (screening before 40 and one this year).  1st child at advanced age, first 56 months then on 46th month began having issues. 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  Married.  Her baby was a premature 6 weeks.      Had elevated liver enzymes while pregnant, had sonogram as well.  She was found to have gallstones.  Was not ruling out preeclampsia.    PMH - history of anemia (took iron supplements while pregnant).    [facility] - mother - diabetes, hypertension, and ESRD (dialysis), arthritis (specialist), hyperlipidemia.  Father - atrial fibrillation, Hepatitis C (treated), hypertension.  Sister - healthy, TIA's (possible hyperlipidemia), hyperlipidemia.  Would like to have her labs rechecked have had elevated liver functions. Abdominal ultrasound done in Oct 2019 found to have gallstones.    She is using an exercise mirror.  She lost weight after her pregnancy.    She had blood in her stool and she had a colonoscopy, had a small polyp was to have rechecked every 5 years - 2015, done at [facility]    Past Medical History:  Diagnosis Date  . Anemia   . Gestational  hypertension   . Seborrheic dermatitis      Family History  Problem Relation Age of Onset  . Diabetes Mother   . Hypertension Mother   . Hypertension Father   . Hyperlipidemia Maternal Grandmother     No current outpatient medications on file.   Allergies  Allergen Reactions  . Pecan Extract Allergy Skin Test Itching    Pecan and walnuts makes Mouth itch  . Pollen Extract Itching    Itching, red eyes. sneezing     Review of Systems  Constitutional: Negative.   Respiratory: Negative.   Cardiovascular: Negative.  Negative for chest pain, palpitations and leg swelling.  Neurological: Negative.  Negative for dizziness and headaches.  Psychiatric/Behavioral: Negative.      Today's Vitals   10/22/19 1504  BP: 110/70  Pulse: 100  Temp: 98.3 F (36.8 C)  TempSrc: Oral  Weight: 224 lb 6.4 oz (101.8 kg)  Height: 5' 3.6" (1.615 m)  PainSc: 0-No pain   Body mass index is 39 kg/m.   Objective:  Physical Exam Constitutional:      Appearance: Normal appearance.  Cardiovascular:     Rate and Rhythm: Normal rate and regular rhythm.     Pulses: Normal pulses.     Heart sounds: Normal heart sounds. No murmur.  Pulmonary:     Effort: Pulmonary effort is normal. No respiratory distress.     Breath sounds: Normal  breath sounds.  Abdominal:     General: Abdomen is flat. Bowel sounds are normal. There is no distension.     Palpations: Abdomen is soft. There is no mass.     Tenderness: There is no abdominal tenderness.  Neurological:     General: No focal deficit present.     Mental Status: She is alert and oriented to person, place, and time.  Psychiatric:        Mood and Affect: Mood normal.        Behavior: Behavior normal.        Thought Content: Thought content normal.        Judgment: Judgment normal.         Assessment And Plan:     1. Elevated liver enzymes  GGT in October was 57, had elevation of liver enzymes while pregnant  Also her father had a history  of hepatitis c and treated as an adult - CMP14+EGFR - Gamma GT, GGT (78938) - Hepatitis C antibody  2. Encounter for screening for metabolic disorder  Family history of hyperlipidemia.  - Lipid Profile  3. Acute vaginitis  Will check for yeast and bacterial vaginosis - Urine cytology ancillary only  4. Obesity (BMI 35.0-39.9 without comorbidity) Chronic Discussed healthy diet and regular exercise options  Encouraged to exercise at least 150 minutes per week with 2 days of strength training - Hemoglobin A1c  5. Family history of hepatitis C  Father had Hepatitis C prior to her birth and was treated in the last couple years  She has had elevated liver enzymes - Hepatitis C antibody   Minette Brine, FNP    THE PATIENT IS ENCOURAGED TO PRACTICE SOCIAL DISTANCING DUE TO THE COVID-19 PANDEMIC.

## 2019-10-23 LAB — HEMOGLOBIN A1C
Est. average glucose Bld gHb Est-mCnc: 120 mg/dL
Hgb A1c MFr Bld: 5.8 % — ABNORMAL HIGH (ref 4.8–5.6)

## 2019-10-23 LAB — CBC
Hematocrit: 36.4 % (ref 34.0–46.6)
Hemoglobin: 11.9 g/dL (ref 11.1–15.9)
MCH: 27.4 pg (ref 26.6–33.0)
MCHC: 32.7 g/dL (ref 31.5–35.7)
MCV: 84 fL (ref 79–97)
Platelets: 380 10*3/uL (ref 150–450)
RBC: 4.35 x10E6/uL (ref 3.77–5.28)
RDW: 13.4 % (ref 11.7–15.4)
WBC: 11 10*3/uL — ABNORMAL HIGH (ref 3.4–10.8)

## 2019-10-23 LAB — LIPID PANEL
Chol/HDL Ratio: 3.3 ratio (ref 0.0–4.4)
Cholesterol, Total: 188 mg/dL (ref 100–199)
HDL: 57 mg/dL (ref >39)
LDL Chol Calc (NIH): 109 mg/dL — ABNORMAL HIGH (ref 0–99)
Triglycerides: 122 mg/dL (ref 0–149)
VLDL Cholesterol Cal: 22 mg/dL (ref 5–40)

## 2019-10-23 LAB — CMP14+EGFR
ALT: 17 IU/L (ref 0–32)
AST: 17 IU/L (ref 0–40)
Albumin/Globulin Ratio: 1.1 — ABNORMAL LOW (ref 1.2–2.2)
Albumin: 4.2 g/dL (ref 3.8–4.8)
Alkaline Phosphatase: 87 IU/L (ref 39–117)
BUN/Creatinine Ratio: 16 (ref 9–23)
BUN: 14 mg/dL (ref 6–24)
Bilirubin Total: <0.2 mg/dL (ref 0.0–1.2)
CO2: 21 mmol/L (ref 20–29)
Calcium: 9.4 mg/dL (ref 8.7–10.2)
Chloride: 101 mmol/L (ref 96–106)
Creatinine, Ser: 0.85 mg/dL (ref 0.57–1.00)
GFR calc Af Amer: 99 mL/min/{1.73_m2} (ref >59)
GFR calc non Af Amer: 86 mL/min/{1.73_m2} (ref >59)
Globulin, Total: 3.9 g/dL (ref 1.5–4.5)
Glucose: 88 mg/dL (ref 65–99)
Potassium: 4.3 mmol/L (ref 3.5–5.2)
Sodium: 138 mmol/L (ref 134–144)
Total Protein: 8.1 g/dL (ref 6.0–8.5)

## 2019-10-23 LAB — GAMMA GT: GGT: 58 IU/L (ref 0–60)

## 2019-10-23 LAB — HEPATITIS C ANTIBODY: Hep C Virus Ab: <0.1 s/co ratio (ref 0.0–0.9)

## 2019-10-29 ENCOUNTER — Encounter: Payer: Self-pay | Admitting: Nurse Practitioner

## 2019-10-30 ENCOUNTER — Other Ambulatory Visit: Payer: Self-pay | Admitting: Nurse Practitioner

## 2019-10-30 DIAGNOSIS — B9689 Other specified bacterial agents as the cause of diseases classified elsewhere: Secondary | ICD-10-CM

## 2019-10-30 DIAGNOSIS — N760 Acute vaginitis: Secondary | ICD-10-CM

## 2019-10-30 LAB — URINE CYTOLOGY ANCILLARY ONLY
Bacterial Vaginitis-Urine: POSITIVE — AB
Candida Urine: NEGATIVE

## 2019-10-30 MED ORDER — METRONIDAZOLE 500 MG PO TABS: 500.0000 mg | ORAL_TABLET | Freq: Three times a day (TID) | ORAL | 0 refills | Status: AC

## 2019-10-30 NOTE — Progress Notes (Signed)
We will continue to monitor in the next 3 months if remains elevated will consider referral to a specialist.

## 2019-11-06 ENCOUNTER — Encounter: Payer: Self-pay | Admitting: Nurse Practitioner

## 2019-11-11 IMAGING — US US ABDOMEN LIMITED
1 series · 15 of 25 positions shown · non-contrast
Comparison: None.

CLINICAL DATA: 41-year-old female with elevated liver enzymes.

EXAM:
ULTRASOUND ABDOMEN LIMITED RIGHT UPPER QUADRANT

[Series 1: us abdomen limited · 15 of 57 slices shown]
[im 1/57]
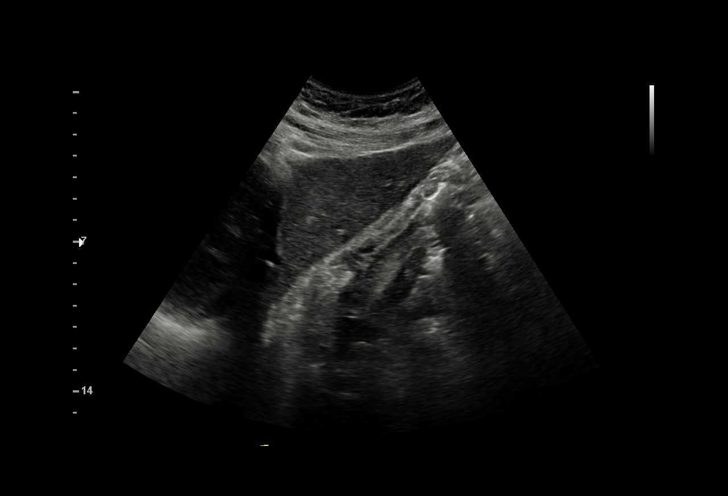
[im 5/57]
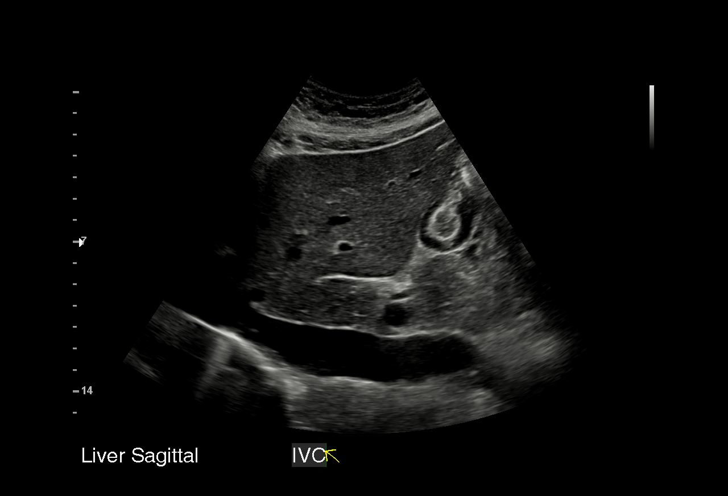
[im 10/57]
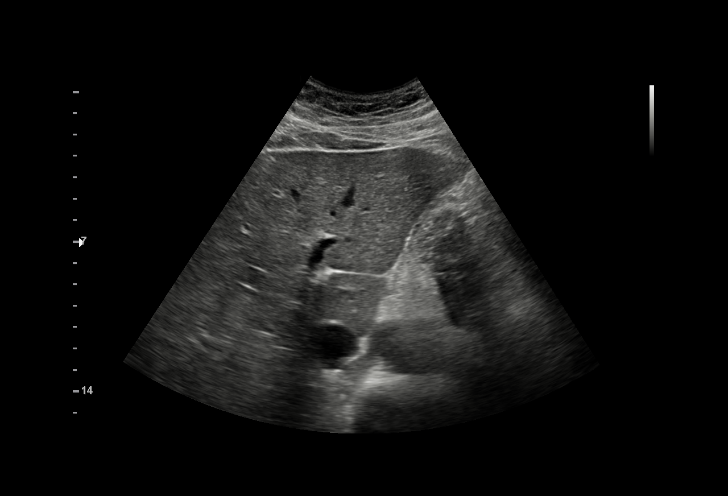
[im 12/57]
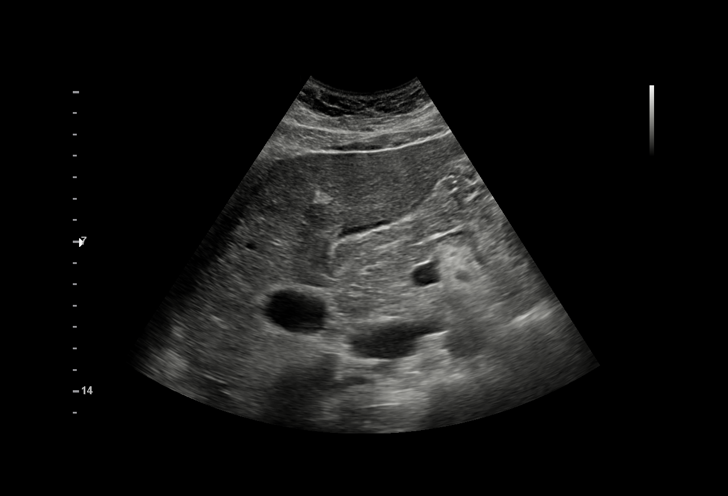
[im 17/57]
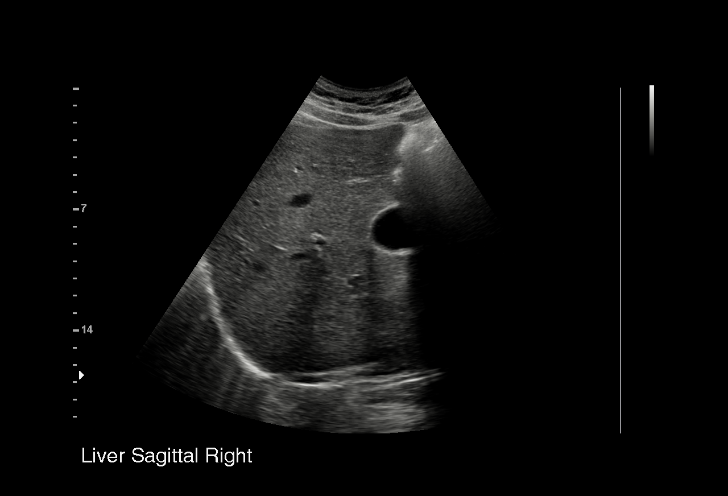
[im 22/57]
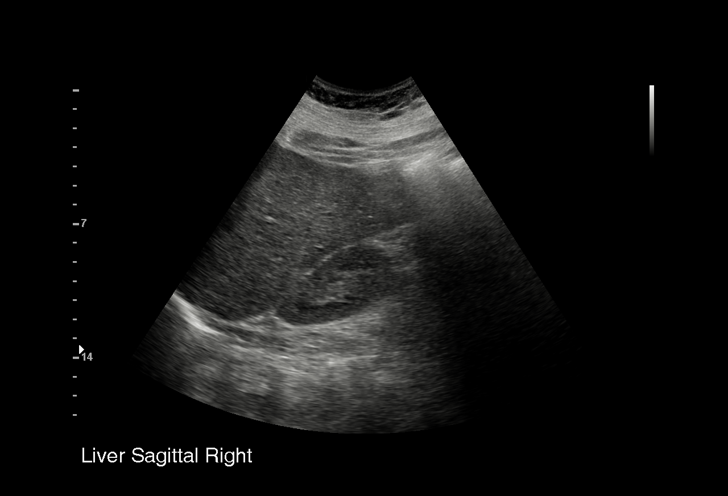
[im 24/57]
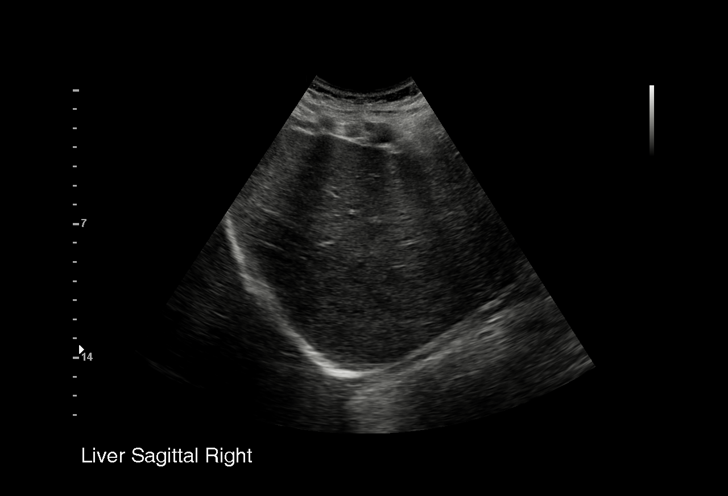
[im 29/57]
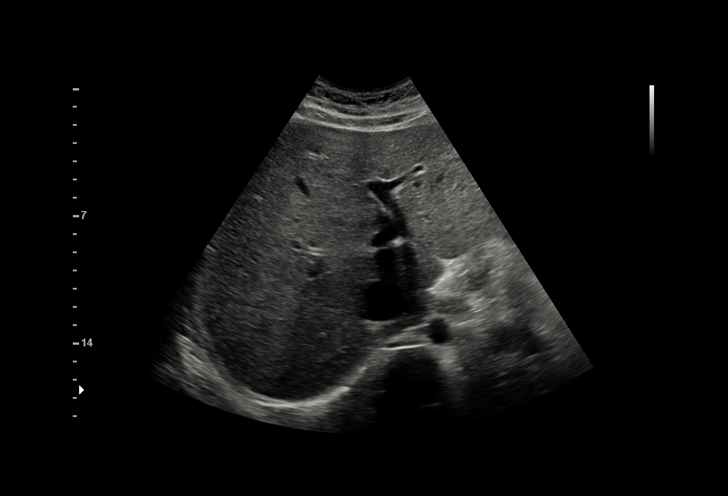
[im 33/57]
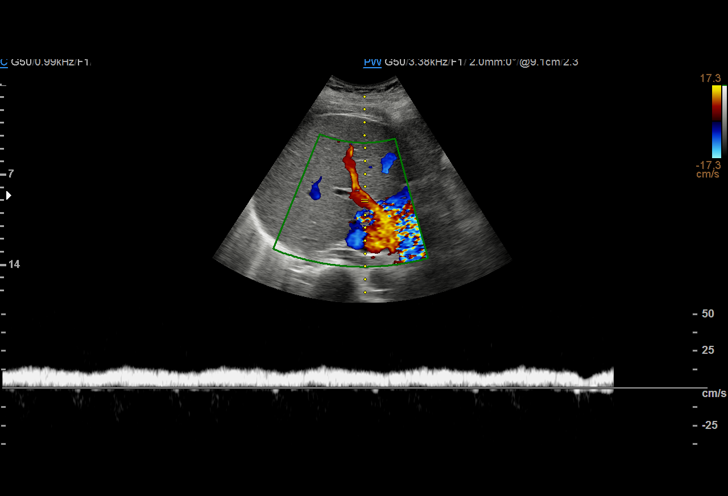
[im 36/57]
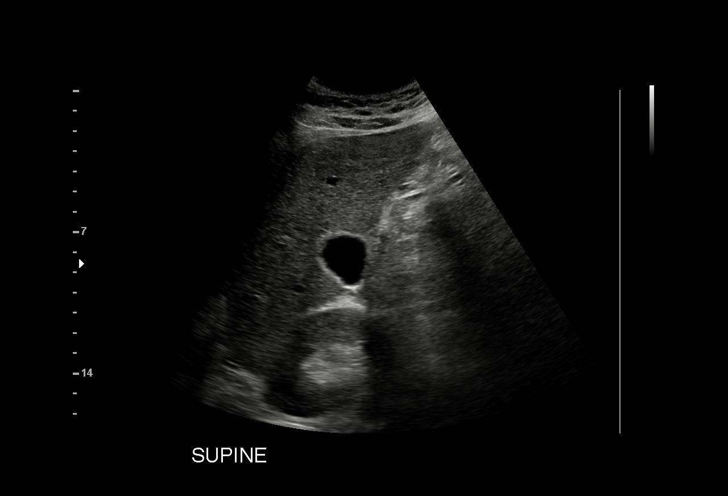
[im 40/57]
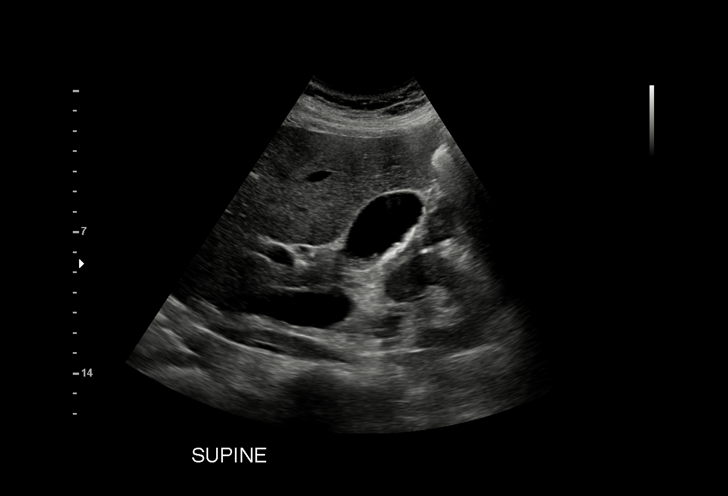
[im 45/57]
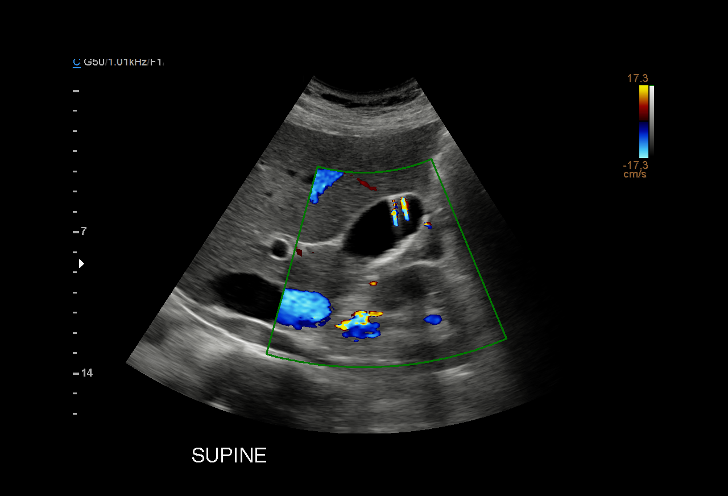
[im 47/57]
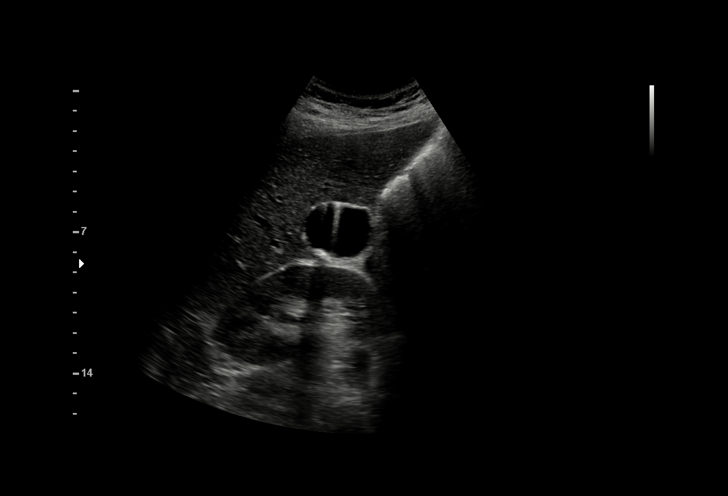
[im 52/57]
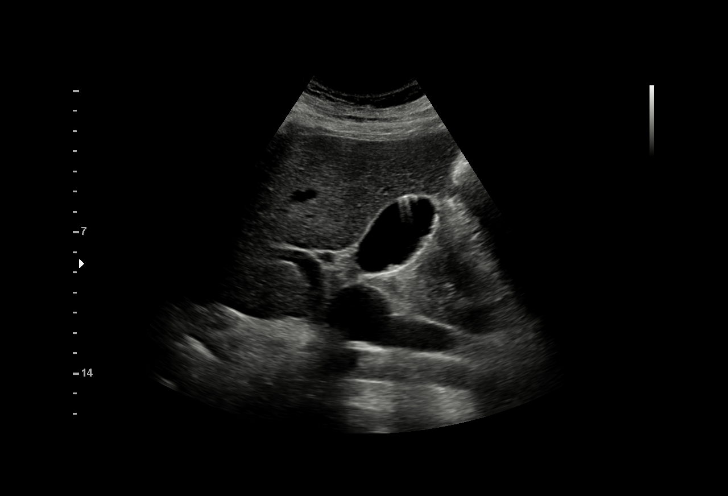
[im 57/57]
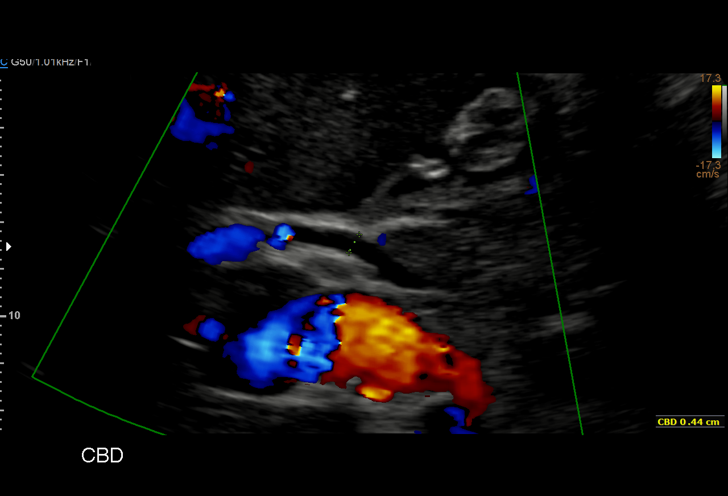

[15 of 25 positions shown; findings below may reference images not displayed]

FINDINGS: Gallbladder:

Small mobile echogenic foci and debris layer within the gallbladder
lumen consistent with sludge and small stones. Additionally, there
is prominent ring down artifact arising from the gallbladder wall
consistent with adenomyomatosis. The gallbladder wall is not
thickened.

Common bile duct:

Diameter: Within normal limits at 4 mm

Liver:

No focal lesion identified. Within normal limits in parenchymal
echogenicity. Portal vein is patent on color Doppler imaging with
normal direction of blood flow towards the liver.
IMPRESSION: 1. Cholelithiasis without sonographic features of acute
cholecystitis or biliary ductal dilatation.
2. Mild adenomyomatosis of the gallbladder wall.
3. Normal sonographic appearance of the liver.

## 2019-12-04 ENCOUNTER — Encounter: Payer: Self-pay | Admitting: Nurse Practitioner

## 2019-12-04 ENCOUNTER — Other Ambulatory Visit: Payer: Self-pay

## 2019-12-04 ENCOUNTER — Ambulatory Visit (INDEPENDENT_AMBULATORY_CARE_PROVIDER_SITE_OTHER): Payer: BC Managed Care – PPO | Admitting: Nurse Practitioner

## 2019-12-04 ENCOUNTER — Other Ambulatory Visit (HOSPITAL_COMMUNITY)
Admission: RE | Admit: 2019-12-04 | Discharge: 2019-12-04 | Disposition: A | Discharge status: Home / Self Care | Payer: BC Managed Care – PPO | Source: Ambulatory Visit | Attending: Nurse Practitioner | Admitting: Nurse Practitioner

## 2019-12-04 VITALS — BP 126/80 | HR 100 | Temp 98.3°F | Ht 63.6 in | Wt 217.8 lb

## 2019-12-04 DIAGNOSIS — N760 Acute vaginitis: Secondary | ICD-10-CM | POA: Insufficient documentation

## 2019-12-04 DIAGNOSIS — L408 Other psoriasis: Secondary | ICD-10-CM

## 2019-12-04 DIAGNOSIS — R300 Dysuria: Secondary | ICD-10-CM

## 2019-12-04 LAB — POCT URINALYSIS DIPSTICK
Glucose, UA: NEGATIVE
Ketones, UA: NEGATIVE
Nitrite, UA: NEGATIVE
Protein, UA: POSITIVE — AB
Spec Grav, UA: 1.025 (ref 1.010–1.025)
Urobilinogen, UA: 2 E.U./dL — AB
pH, UA: 6 (ref 5.0–8.0)

## 2019-12-04 MED ORDER — FLUCONAZOLE 100 MG PO TABS: ORAL_TABLET | ORAL | 0 refills | Status: DC

## 2019-12-04 NOTE — Progress Notes (Signed)
This visit occurred during the SARS-CoV-2 public health emergency.  Safety protocols were in place, including screening questions prior to the visit, additional usage of staff PPE, and extensive cleaning of exam room while observing appropriate contact time as indicated for disinfecting solutions.  Subjective:     Patient ID: Miranda Shah , female    DOB: August 07, 1976 , 44 y.o.   MRN: [id number]   Chief Complaint  Patient presents with  . Dysuria    patient stated she has been having some itching,urinary frequency, the yellow of her urine is really dark    HPI  Not having as much urine when urinating.  She has some itching at the end of her urination. Feels like a sharp pain to the vaginal wall.  The only symptom she will have with a yeast infection is itching.  She just bought a juicer.  Decreased amount of prep.    Dysuria  This is a new problem. The current episode started 1 to 4 weeks ago. The problem occurs intermittently. There has been no fever. She is sexually active. There is no history of pyelonephritis. Pertinent negatives include no chills, discharge, flank pain, nausea or vomiting. She has tried nothing for the symptoms. There is no history of recurrent UTIs.     Past Medical History:  Diagnosis Date  . Anemia   . Gestational hypertension   . Seborrheic dermatitis      Family History  Problem Relation Age of Onset  . Diabetes Mother   . Hypertension Mother   . Hypertension Father   . Hyperlipidemia Maternal Grandmother      Current Outpatient Medications:  Marland Kitchen  VITAMIN D PO, Take by mouth. Take one tablet daily, Disp: , Rfl:    Allergies  Allergen Reactions  . Pecan Extract Allergy Skin Test Itching    Pecan and walnuts makes Mouth itch  . Pollen Extract Itching    Itching, red eyes. sneezing     Review of Systems  Constitutional: Negative for chills and fatigue.  Respiratory: Negative.  Negative for cough.   Cardiovascular: Negative for chest pain,  palpitations and leg swelling.  Gastrointestinal: Negative for nausea and vomiting.  Endocrine: Negative for polydipsia, polyphagia and polyuria.  Genitourinary: Positive for dysuria. Negative for flank pain and vaginal discharge.       Vaginal itching.    Neurological: Negative for dizziness, light-headedness and headaches.  Psychiatric/Behavioral: Negative.      Today's Vitals   12/04/19 0853  BP: 126/80  Pulse: 100  Temp: 98.3 F (36.8 C)  TempSrc: Oral  Weight: 217 lb 12.8 oz (98.8 kg)  Height: 5' 3.6" (1.615 m)  PainSc: 0-No pain   Body mass index is 37.86 kg/m.   Objective:  Physical Exam Constitutional:      General: She is not in acute distress.    Appearance: Normal appearance.  Cardiovascular:     Rate and Rhythm: Normal rate and regular rhythm.     Pulses: Normal pulses.     Heart sounds: Normal heart sounds. No murmur.  Pulmonary:     Effort: Pulmonary effort is normal. No respiratory distress.     Breath sounds: Normal breath sounds.  Skin:    Capillary Refill: Capillary refill takes less than 2 seconds.  Neurological:     General: No focal deficit present.     Mental Status: She is alert and oriented to person, place, and time.  Psychiatric:        Mood and Affect:  Mood normal.        Behavior: Behavior normal.        Thought Content: Thought content normal.        Judgment: Judgment normal.         Assessment And Plan:     1. Dysuria  She was treated for bacterial vaginosis in December will recheck to see if need additional treatment  Urinalysis is negative for UTI has protein will check culture and advised to increase water intake. - POCT Urinalysis Dipstick (81002) - Culture, Urine - fluconazole (DIFLUCAN) 100 MG tablet; Take 1 tablet by mouth today repeat in 5 days.  Dispense: 2 tablet; Refill: 0  2. Acute vaginitis  Will treat for yeast infection - fluconazole (DIFLUCAN) 100 MG tablet; Take 1 tablet by mouth today repeat in 5 days.   Dispense: 2 tablet; Refill: 0  3. Sebopsoriasis  She has seen Dr. Leonie Green in the past, will re refer has been 3 years  Encouraged can take zinc, vitamin d and vitamin B at this time. - Ambulatory referral to Dermatology   Minette Brine, FNP    THE PATIENT IS ENCOURAGED TO PRACTICE SOCIAL DISTANCING DUE TO THE COVID-19 PANDEMIC.

## 2019-12-04 NOTE — Progress Notes (Signed)
POC

## 2019-12-08 LAB — URINE CULTURE

## 2019-12-10 LAB — URINE CYTOLOGY ANCILLARY ONLY
Bacterial Vaginitis-Urine: NEGATIVE
Candida Urine: NEGATIVE

## 2020-01-05 DIAGNOSIS — Z03818 Encounter for observation for suspected exposure to other biological agents ruled out: Secondary | ICD-10-CM | POA: Diagnosis not present

## 2020-01-05 DIAGNOSIS — Z20828 Contact with and (suspected) exposure to other viral communicable diseases: Secondary | ICD-10-CM | POA: Diagnosis not present

## 2020-01-16 DIAGNOSIS — Z20828 Contact with and (suspected) exposure to other viral communicable diseases: Secondary | ICD-10-CM | POA: Diagnosis not present

## 2020-01-22 ENCOUNTER — Ambulatory Visit: Payer: Self-pay | Admitting: Nurse Practitioner

## 2020-01-24 ENCOUNTER — Encounter: Payer: Self-pay | Admitting: Nurse Practitioner

## 2020-01-24 ENCOUNTER — Other Ambulatory Visit: Payer: Self-pay

## 2020-01-24 ENCOUNTER — Ambulatory Visit (INDEPENDENT_AMBULATORY_CARE_PROVIDER_SITE_OTHER): Payer: BC Managed Care – PPO | Admitting: Nurse Practitioner

## 2020-01-24 VITALS — BP 116/80 | HR 104 | Temp 98.8°F | Ht 63.6 in | Wt 219.4 lb

## 2020-01-24 DIAGNOSIS — D72829 Elevated white blood cell count, unspecified: Secondary | ICD-10-CM | POA: Diagnosis not present

## 2020-01-24 DIAGNOSIS — R7309 Other abnormal glucose: Secondary | ICD-10-CM

## 2020-01-24 DIAGNOSIS — R748 Abnormal levels of other serum enzymes: Secondary | ICD-10-CM | POA: Diagnosis not present

## 2020-01-24 NOTE — Progress Notes (Signed)
y

## 2020-01-24 NOTE — Progress Notes (Signed)
This visit occurred during the SARS-CoV-2 public health emergency.  Safety protocols were in place, including screening questions prior to the visit, additional usage of staff PPE, and extensive cleaning of exam room while observing appropriate contact time as indicated for disinfecting solutions.  Subjective:     Patient ID: Miranda Shah , female    DOB: Jan 01, 1976 , 44 y.o.   MRN: [id number]   Chief Complaint  Patient presents with  . elevated liver enzymes f/u    HPI  She is here today for follow up elevated liver enzymes.   She had been having an elevated white count.  11/2018 she had an elevated white count and at that time she had a possible gallbladder attack and was to consult with a surgeon. She did not consult with a surgeon  Wt Readings from Last 3 Encounters: 01/24/20 : 219 lb 6.4 oz (99.5 kg) 12/04/19 : 217 lb 12.8 oz (98.8 kg) 10/22/19 : 224 lb 6.4 oz (101.8 kg)     Past Medical History:  Diagnosis Date  . Anemia   . Gestational hypertension   . Seborrheic dermatitis      Family History  Problem Relation Age of Onset  . Diabetes Mother   . Hypertension Mother   . Hypertension Father   . Hyperlipidemia Maternal Grandmother      Current Outpatient Medications:  Marland Kitchen  Multiple Vitamins-Minerals (ZINC PO), Take by mouth daily., Disp: , Rfl:  .  Pyridoxine HCl (VITAMIN B6 PO), Take by mouth daily., Disp: , Rfl:  .  VITAMIN D PO, Take by mouth. Take one tablet daily, Disp: , Rfl:    Allergies  Allergen Reactions  . Pecan Extract Allergy Skin Test Itching    Pecan and walnuts makes Mouth itch  . Pollen Extract Itching    Itching, red eyes. sneezing     Review of Systems  Constitutional: Negative.   Respiratory: Negative.   Cardiovascular: Negative.   Neurological: Negative for dizziness and headaches.  Psychiatric/Behavioral: Negative.      Today's Vitals   01/24/20 1502  BP: 116/80  Pulse: (!) 104  Temp: 98.8 F (37.1 C)  TempSrc: Oral  Weight:  219 lb 6.4 oz (99.5 kg)  Height: 5' 3.6" (1.615 m)  PainSc: 0-No pain   Body mass index is 38.14 kg/m.   Objective:  Physical Exam Constitutional:      Appearance: Normal appearance.  Cardiovascular:     Rate and Rhythm: Normal rate and regular rhythm.     Pulses: Normal pulses.     Heart sounds: Normal heart sounds. No murmur.  Pulmonary:     Effort: Pulmonary effort is normal.     Breath sounds: Normal breath sounds.  Neurological:     General: No focal deficit present.     Mental Status: She is alert and oriented to person, place, and time.     Cranial Nerves: No cranial nerve deficit.  Psychiatric:        Mood and Affect: Mood normal.        Behavior: Behavior normal.        Thought Content: Thought content normal.        Judgment: Judgment normal.         Assessment And Plan:     1. Elevated liver enzymes  At her last visit these were normal  I will not repeat at this time  2. Leukocytosis, unspecified type  Will recheck levels today denies any symptoms concerning for infection at  this time  Pending results will refer to a specialist as her WBCs have been elevated at the last 2 blood draws - CBC  3. Abnormal glucose  Discussed importance of healthy diet low in sugar and carbs and increasing physical activity - Hemoglobin A1c   Arnette Felts, FNP    THE PATIENT IS ENCOURAGED TO PRACTICE SOCIAL DISTANCING DUE TO THE COVID-19 PANDEMIC.

## 2020-01-25 ENCOUNTER — Encounter: Payer: Self-pay | Admitting: Nurse Practitioner

## 2020-01-25 LAB — CBC
Hematocrit: 34 % (ref 34.0–46.6)
Hemoglobin: 11.6 g/dL (ref 11.1–15.9)
MCH: 28.9 pg (ref 26.6–33.0)
MCHC: 34.1 g/dL (ref 31.5–35.7)
MCV: 85 fL (ref 79–97)
Platelets: 319 10*3/uL (ref 150–450)
RBC: 4.01 x10E6/uL (ref 3.77–5.28)
RDW: 14.1 % (ref 11.7–15.4)
WBC: 10.1 10*3/uL (ref 3.4–10.8)

## 2020-01-25 LAB — HEMOGLOBIN A1C
Est. average glucose Bld gHb Est-mCnc: 120 mg/dL
Hgb A1c MFr Bld: 5.8 % — ABNORMAL HIGH (ref 4.8–5.6)

## 2020-01-28 ENCOUNTER — Ambulatory Visit: Payer: Self-pay | Attending: Internal Medicine

## 2020-01-28 ENCOUNTER — Telehealth: Payer: Self-pay

## 2020-01-28 DIAGNOSIS — Z23 Encounter for immunization: Secondary | ICD-10-CM

## 2020-01-28 NOTE — Telephone Encounter (Signed)
Left vm for pt to return call for lab results  

## 2020-01-28 NOTE — Telephone Encounter (Signed)
-----   Message from Arnette Felts, FNP sent at 01/25/2020 12:53 PM EDT ----- Your white count is improved and your HgbA1c is stable, continue with exercise and limiting intake of sugary foods and drinks.

## 2020-01-28 NOTE — Progress Notes (Signed)
   Covid-19 Vaccination Clinic  Name:  Miranda Shah    MRN: [id number] DOB: 1976-09-21  01/28/2020  [name] was observed post Covid-19 immunization for 15 minutes without incident. She was provided with Vaccine Information Sheet and instruction to access the V-Safe system.   [name] was instructed to call 911 with any severe reactions post vaccine: Marland Kitchen Difficulty breathing  . Swelling of face and throat  . A fast heartbeat  . A bad rash all over body  . Dizziness and weakness   Immunizations Administered    Name Date Dose VIS Date Route   Pfizer COVID-19 Vaccine 01/28/2020  2:40 PM 0.3 mL 10/12/2019 Intramuscular   Manufacturer: [employer], [employer]   Lot: [id number]   NDC: [address]-8

## 2020-01-29 ENCOUNTER — Telehealth: Payer: Self-pay

## 2020-01-29 NOTE — Telephone Encounter (Signed)
LEFT VM FOR PT TO GIVE LAB RESULTS. LETTER MAILED

## 2020-01-29 NOTE — Telephone Encounter (Signed)
-----   Message from [name], FNP sent at 01/25/2020 12:53 PM EDT ----- Your white count is improved and your HgbA1c is stable, continue with exercise and limiting intake of sugary foods and drinks.  

## 2020-02-20 ENCOUNTER — Ambulatory Visit: Payer: Self-pay | Attending: Internal Medicine

## 2020-02-20 DIAGNOSIS — Z23 Encounter for immunization: Secondary | ICD-10-CM

## 2020-02-20 NOTE — Progress Notes (Signed)
   Covid-19 Vaccination Clinic  Name:  Miranda Shah    MRN: [id number] DOB: 07-Mar-1976  02/20/2020  [name] was observed post Covid-19 immunization for 15 minutes without incident. She was provided with Vaccine Information Sheet and instruction to access the V-Safe system.   [name] was instructed to call 911 with any severe reactions post vaccine: Marland Kitchen Difficulty breathing  . Swelling of face and throat  . A fast heartbeat  . A bad rash all over body  . Dizziness and weakness   Immunizations Administered    Name Date Dose VIS Date Route   Pfizer COVID-19 Vaccine 02/20/2020  3:43 PM 0.3 mL 12/26/2018 Intramuscular   Manufacturer: [employer], [employer]   Lot: [id number]   NDC: [address]-9

## 2020-02-27 DIAGNOSIS — Z20822 Contact with and (suspected) exposure to covid-19: Secondary | ICD-10-CM | POA: Diagnosis not present

## 2020-02-27 DIAGNOSIS — Z20828 Contact with and (suspected) exposure to other viral communicable diseases: Secondary | ICD-10-CM | POA: Diagnosis not present

## 2020-05-14 ENCOUNTER — Emergency Department (HOSPITAL_BASED_OUTPATIENT_CLINIC_OR_DEPARTMENT_OTHER)
Admission: EM | Admit: 2020-05-14 | Discharge: 2020-05-14 | Disposition: A | Discharge status: Home / Self Care | Payer: BC Managed Care – PPO | Attending: Emergency Medicine | Admitting: Emergency Medicine

## 2020-05-14 ENCOUNTER — Emergency Department (HOSPITAL_BASED_OUTPATIENT_CLINIC_OR_DEPARTMENT_OTHER): Payer: BC Managed Care – PPO

## 2020-05-14 ENCOUNTER — Encounter (HOSPITAL_BASED_OUTPATIENT_CLINIC_OR_DEPARTMENT_OTHER): Payer: Self-pay | Admitting: Emergency Medicine

## 2020-05-14 ENCOUNTER — Other Ambulatory Visit: Payer: Self-pay

## 2020-05-14 DIAGNOSIS — Z87891 Personal history of nicotine dependence: Secondary | ICD-10-CM | POA: Diagnosis not present

## 2020-05-14 DIAGNOSIS — R079 Chest pain, unspecified: Secondary | ICD-10-CM | POA: Insufficient documentation

## 2020-05-14 DIAGNOSIS — R0789 Other chest pain: Secondary | ICD-10-CM | POA: Diagnosis not present

## 2020-05-14 DIAGNOSIS — Z3202 Encounter for pregnancy test, result negative: Secondary | ICD-10-CM | POA: Diagnosis not present

## 2020-05-14 LAB — BASIC METABOLIC PANEL
Anion gap: 11 (ref 5–15)
BUN: 20 mg/dL (ref 6–20)
CO2: 24 mmol/L (ref 22–32)
Calcium: 9.1 mg/dL (ref 8.9–10.3)
Chloride: 102 mmol/L (ref 98–111)
Creatinine, Ser: 0.84 mg/dL (ref 0.44–1.00)
GFR calc Af Amer: >60 mL/min (ref >60)
GFR calc non Af Amer: >60 mL/min (ref >60)
Glucose, Bld: 98 mg/dL (ref 70–99)
Potassium: 3.5 mmol/L (ref 3.5–5.1)
Sodium: 137 mmol/L (ref 135–145)

## 2020-05-14 LAB — TROPONIN I (HIGH SENSITIVITY)
Troponin I (High Sensitivity): <2 ng/L (ref <18)
Troponin I (High Sensitivity): <2 ng/L (ref <18)

## 2020-05-14 LAB — CBC
HCT: 36.7 % (ref 36.0–46.0)
Hemoglobin: 11.9 g/dL — ABNORMAL LOW (ref 12.0–15.0)
MCH: 27.7 pg (ref 26.0–34.0)
MCHC: 32.4 g/dL (ref 30.0–36.0)
MCV: 85.3 fL (ref 80.0–100.0)
Platelets: 309 10*3/uL (ref 150–400)
RBC: 4.3 MIL/uL (ref 3.87–5.11)
RDW: 13.4 % (ref 11.5–15.5)
WBC: 12.7 10*3/uL — ABNORMAL HIGH (ref 4.0–10.5)
nRBC: 0 % (ref 0.0–0.2)

## 2020-05-14 LAB — PREGNANCY, URINE: Preg Test, Ur: NEGATIVE

## 2020-05-14 MED ORDER — SODIUM CHLORIDE 0.9% FLUSH
3.0000 mL | Freq: Once | INTRAVENOUS | Status: DC
  Filled 2020-05-14: qty 3

## 2020-05-14 NOTE — ED Provider Notes (Signed)
[facility] EMERGENCY DEPARTMENT Provider Note   CSN: [id number] Arrival date & time: 05/14/20  1923     History Chief Complaint  Patient presents with  . Chest Pain    Miranda Shah is a 44 y.o. female.  Patient is a 44 year old female with no significant past medical history.  She presents today for evaluation of chest discomfort.  She was holding her 61-month-old child when she developed what she describes as a "pins-and-needles" sensation in the center of her chest.  She denies any nausea, diaphoresis, or radiation to the arm or jaw.  She denies any recent exertional symptoms.  The history is provided by the patient.  Chest Pain Pain location:  Substernal area Pain radiates to:  Does not radiate Pain severity:  Moderate Onset quality:  Sudden Duration:  1 hour Progression:  Resolved Chronicity:  New Relieved by:  Nothing Worsened by:  Nothing      Past Medical History:  Diagnosis Date  . Anemia   . Gestational hypertension   . Seborrheic dermatitis     Patient Active Problem List   Diagnosis Date Noted  . Preeclampsia in postpartum period 09/14/2018  . Postpartum anemia 09/09/2018  . Preeclampsia, severe, third trimester 09/08/2018  . Elevated liver enzymes 08/17/2018  . Postpartum hypertension 08/16/2018  . Obesity during pregnancy in third trimester 08/03/2018  . Maternal age 13+, multigravida, antepartum 04/04/2018    Past Surgical History:  Procedure Laterality Date  . CESAREAN SECTION N/A 09/08/2018   Procedure: CESAREAN SECTION;  Surgeon: Essie Hart, MD;  Location: [facility] BIRTHING SUITES;  Service: Obstetrics;  Laterality: N/A;     OB History    Gravida  2   Para  1   Term      Preterm  1   AB  1   Living  1     SAB      TAB  1   Ectopic      Multiple      Live Births  1           Family History  Problem Relation Age of Onset  . Diabetes Mother   . Hypertension Mother   . Hypertension Father   . Hyperlipidemia  Maternal Grandmother     Social History   Tobacco Use  . Smoking status: Former Smoker    Packs/day: 0.50    Quit date: 08/03/2013    Years since quitting: 6.7  . Smokeless tobacco: Never Used  Vaping Use  . Vaping Use: Never used  Substance Use Topics  . Alcohol use: Not Currently    Comment: occasional  . Drug use: Not Currently    Types: Marijuana    Comment: none since college    Home Medications Prior to Admission medications   Medication Sig Start Date End Date Taking? Authorizing Provider  Multiple Vitamins-Minerals (ZINC PO) Take by mouth daily.    [provider]  Pyridoxine HCl (VITAMIN B6 PO) Take by mouth daily.    [provider]  VITAMIN D PO Take by mouth. Take one tablet daily    [provider]    Allergies    Pecan extract allergy skin test and Pollen extract  Review of Systems   Review of Systems  Cardiovascular: Positive for chest pain.  All other systems reviewed and are negative.   Physical Exam Updated Vital Signs BP (!) 115/95   Pulse 94   Temp 98.8 F (37.1 C) (Oral)   Resp 13  Ht 5\' 3"  (1.6 m)   Wt 96.2 kg   SpO2 100%   BMI 37.55 kg/m   Physical Exam Vitals and nursing note reviewed.  Constitutional:      General: She is not in acute distress.    Appearance: She is well-developed. She is not diaphoretic.  HENT:     Head: Normocephalic and atraumatic.  Cardiovascular:     Rate and Rhythm: Normal rate and regular rhythm.     Heart sounds: No murmur heard.  No friction rub. No gallop.   Pulmonary:     Effort: Pulmonary effort is normal. No respiratory distress.     Breath sounds: Normal breath sounds. No wheezing.  Abdominal:     General: Bowel sounds are normal. There is no distension.     Palpations: Abdomen is soft.     Tenderness: There is no abdominal tenderness.  Musculoskeletal:        General: Normal range of motion.     Cervical back: Normal range of motion and neck supple.     Right  lower leg: No tenderness. No edema.     Left lower leg: No tenderness. No edema.  Skin:    General: Skin is warm and dry.  Neurological:     Mental Status: She is alert and oriented to person, place, and time.     ED Results / Procedures / Treatments   Labs (all labs ordered are listed, but only abnormal results are displayed) Labs Reviewed  CBC - Abnormal; Notable for the following components:      Result Value   WBC 12.7 (*)    Hemoglobin 11.9 (*)    All other components within normal limits  BASIC METABOLIC PANEL  PREGNANCY, URINE  TROPONIN I (HIGH SENSITIVITY)  TROPONIN I (HIGH SENSITIVITY)    EKG EKG Interpretation  Date/Time:  Wednesday May 14 2020 19:29:11 EDT Ventricular Rate:  111 PR Interval:  126 QRS Duration: 78 QT Interval:  318 QTC Calculation: 432 R Axis:   59 Text Interpretation: Sinus tachycardia Otherwise normal ECG Confirmed by [id number] (Geoffery Lyons) on 05/14/2020 8:28:19 PM   Radiology DG Chest 2 View  Result Date: 05/14/2020 CLINICAL DATA:  Chest pain EXAM: CHEST - 2 VIEW COMPARISON:  None. FINDINGS: The heart size and mediastinal contours are within normal limits. Both lungs are clear. The visualized skeletal structures are unremarkable. IMPRESSION: No active cardiopulmonary disease. Electronically Signed   By: 05/16/2020 MD   On: 05/14/2020 20:05    Procedures Procedures (including critical care time)  Medications Ordered in ED Medications  sodium chloride flush (NS) 0.9 % injection 3 mL (has no administration in time range)    ED Course  I have reviewed the triage vital signs and the nursing notes.  Pertinent labs & imaging results that were available during my care of the patient were reviewed by me and considered in my medical decision making (see chart for details).    MDM Rules/Calculators/A&P  Patient presents with complaints of chest discomfort as described in the HPI.  Her physical examination are unremarkable and vital  signs are stable.  Work-up shows an unchanged EKG and negative troponin x2.  Symptoms are atypical for cardiac pain and I suspect a musculoskeletal etiology.  Patient to be discharged with rest and as needed return.  Final Clinical Impression(s) / ED Diagnoses Final diagnoses:  None    Rx / DC Orders ED Discharge Orders    None  Geoffery Lyons, MD 05/14/20 2310

## 2020-05-14 NOTE — Discharge Instructions (Addendum)
Return to the ER if symptoms significantly worsen or change.  Rest.  Follow-up with your primary doctor in the next week.

## 2020-05-14 NOTE — ED Triage Notes (Signed)
  Patient comes in with chest pain that started about 20 mins ago.  Patient states the pain was upper chest and radiated to upper back.  Patient states she has no cardiac history.  Pain feels like pins and needles, and intermittent.  5/10.  No N/V, diaphoresis, numbness, and no pain in arms, jaw, or shoulder.

## 2020-05-15 ENCOUNTER — Encounter: Payer: Self-pay | Admitting: Nurse Practitioner

## 2020-05-15 ENCOUNTER — Telehealth: Payer: Self-pay

## 2020-05-15 NOTE — Telephone Encounter (Signed)
Patient called stating she still has not heard back regarding her referral to GI nor for the sonogram. I returned her call and advised her that the referral and sonogram were depending on her labs and since they were normal she did not need the referral or sonogram per JM. YL,RMA

## 2020-05-25 DIAGNOSIS — Z20822 Contact with and (suspected) exposure to covid-19: Secondary | ICD-10-CM | POA: Diagnosis not present

## 2020-05-26 ENCOUNTER — Encounter: Payer: Self-pay | Admitting: Nurse Practitioner

## 2020-05-27 ENCOUNTER — Other Ambulatory Visit: Payer: Self-pay

## 2020-05-27 ENCOUNTER — Encounter: Payer: Self-pay | Admitting: Nurse Practitioner

## 2020-05-27 ENCOUNTER — Ambulatory Visit (INDEPENDENT_AMBULATORY_CARE_PROVIDER_SITE_OTHER): Payer: BC Managed Care – PPO | Admitting: Nurse Practitioner

## 2020-05-27 VITALS — BP 136/84 | HR 76 | Temp 98.6°F | Ht 63.6 in | Wt 214.2 lb

## 2020-05-27 DIAGNOSIS — Z8759 Personal history of other complications of pregnancy, childbirth and the puerperium: Secondary | ICD-10-CM

## 2020-05-27 DIAGNOSIS — R030 Elevated blood-pressure reading, without diagnosis of hypertension: Secondary | ICD-10-CM | POA: Diagnosis not present

## 2020-05-27 DIAGNOSIS — R0981 Nasal congestion: Secondary | ICD-10-CM

## 2020-05-27 DIAGNOSIS — Z6837 Body mass index (BMI) 37.0-37.9, adult: Secondary | ICD-10-CM

## 2020-05-27 DIAGNOSIS — R0789 Other chest pain: Secondary | ICD-10-CM | POA: Diagnosis not present

## 2020-05-27 DIAGNOSIS — E6609 Other obesity due to excess calories: Secondary | ICD-10-CM

## 2020-05-27 MED ORDER — CARBINOXAMINE MALEATE 6 MG PO TABS: 1.0000 | ORAL_TABLET | Freq: Two times a day (BID) | ORAL | 1 refills | Status: DC

## 2020-05-27 NOTE — Progress Notes (Signed)
This visit occurred during the SARS-CoV-2 public health emergency.  Safety protocols were in place, including screening questions prior to the visit, additional usage of staff PPE, and extensive cleaning of exam room while observing appropriate contact time as indicated for disinfecting solutions.  Subjective:     Patient ID: Miranda Shah , female    DOB: 04-30-1976 , 44 y.o.   MRN: [id number]   Chief Complaint  Patient presents with  . Referral    patient stated she would like to be referred to a cardiologist     HPI  Here for referral to cardiologist.  She reports she has been in the hospital before and after having her baby with chest pain.  She was pre-eclamptic during. Her father has afib, paternal grandmother with heart attack and her mother with "acute heart failure".  She had her child Sep 08, 2018.  She has been having intermittent chest pain/discomfort. She does report having a heart murmur at age 24 which went away and again while she was pregnant.    She does carry around her son who is 20 months often. Was having a hard time differentiating the pain because had pain to her back too. She has also had some problems with possible gastric issues with similar symptoms. After drinking the cocktail her pain went away.   Wt Readings from Last 3 Encounters: 05/27/20 : (!) 214 lb 3.2 oz (97.2 kg) 05/14/20 : 212 lb (96.2 kg) 01/24/20 : 219 lb 6.4 oz (99.5 kg)  She does have some runny nose, had covid test yesterday was negative.      Past Medical History:  Diagnosis Date  . Anemia   . Gestational hypertension   . Seborrheic dermatitis      Family History  Problem Relation Age of Onset  . Diabetes Mother   . Hypertension Mother   . Hypertension Father   . Hyperlipidemia Maternal Grandmother      Current Outpatient Medications:  Marland Kitchen  Multiple Vitamin (MULTIVITAMIN) capsule, Take 1 capsule by mouth daily., Disp: , Rfl:  .  Pyridoxine HCl (VITAMIN B6 PO), Take by mouth  daily., Disp: , Rfl:  .  VITAMIN D PO, Take by mouth. Take one tablet daily, Disp: , Rfl:  .  Carbinoxamine Maleate (RYVENT) 6 MG TABS, Take 1 tablet by mouth in the morning and at bedtime., Disp: 120 tablet, Rfl: 1 .  Multiple Vitamins-Minerals (ZINC PO), Take by mouth daily. (Patient not taking: Reported on 05/26/2020), Disp: , Rfl:    Allergies  Allergen Reactions  . Pecan Extract Allergy Skin Test Itching    Pecan and walnuts makes Mouth itch  . Pollen Extract Itching    Itching, red eyes. sneezing     Review of Systems  Constitutional: Negative.   HENT: Positive for rhinorrhea. Negative for congestion.   Respiratory: Negative.  Negative for cough.   Cardiovascular: Negative.  Negative for chest pain (none currently), palpitations and leg swelling.  Psychiatric/Behavioral: Negative.      Today's Vitals   05/27/20 1622  BP: (!) 136/84  Pulse: 76  Temp: 98.6 F (37 C)  TempSrc: Oral  Weight: (!) 214 lb 3.2 oz (97.2 kg)  Height: 5' 3.6" (1.615 m)  PainSc: 0-No pain   Body mass index is 37.23 kg/m.   Objective:  Physical Exam Constitutional:      General: She is not in acute distress.    Appearance: Normal appearance.  Cardiovascular:     Rate and Rhythm: Normal rate  and regular rhythm.     Pulses: Normal pulses.     Heart sounds: Normal heart sounds. No murmur heard.   Pulmonary:     Effort: Pulmonary effort is normal. No respiratory distress.     Breath sounds: Normal breath sounds.  Neurological:     General: No focal deficit present.     Mental Status: She is alert and oriented to person, place, and time.     Cranial Nerves: No cranial nerve deficit.  Psychiatric:        Mood and Affect: Mood normal.        Behavior: Behavior normal.        Thought Content: Thought content normal.        Judgment: Judgment normal.         Assessment And Plan:     1. Chest discomfort Comments: She has had multiple visits to the ER for chest discomfort with most recent  was negative for cardiac event. Will refer to Cardiology - Ambulatory referral to Cardiology  2. Elevated blood-pressure reading without diagnosis of hypertension Comments: - history of preeclampsia I have encouraged her to avoid high salt foods and focus on weight loss  3. History of pre-eclampsia - Ambulatory referral to Cardiology  4. Class 2 obesity due to excess calories without serious comorbidity with body mass index (BMI) of 37.0 to 37.9 in adult  Chronic  Discussed healthy diet and regular exercise options   Encouraged to exercise at least 150 minutes per week with 2 days of strength training  5. Nasal congestion   Reports having a negative covid test  Will treat with ryvent she is to take at night in the event it makes her sleep - Carbinoxamine Maleate (RYVENT) 6 MG TABS; Take 1 tablet by mouth in the morning and at bedtime.  Dispense: 120 tablet; Refill: 1     Patient was given opportunity to ask questions. Patient verbalized understanding of the plan and was able to repeat key elements of the plan. All questions were answered to their satisfaction.  Arnette Felts, FNP   I, Arnette Felts, FNP, have reviewed all documentation for this visit. The documentation on 05/31/20 for the exam, diagnosis, procedures, and orders are all accurate and complete.  THE PATIENT IS ENCOURAGED TO PRACTICE SOCIAL DISTANCING DUE TO THE COVID-19 PANDEMIC.

## 2020-06-09 DIAGNOSIS — L249 Irritant contact dermatitis, unspecified cause: Secondary | ICD-10-CM | POA: Diagnosis not present

## 2020-06-09 DIAGNOSIS — L219 Seborrheic dermatitis, unspecified: Secondary | ICD-10-CM | POA: Insufficient documentation

## 2020-06-09 DIAGNOSIS — L821 Other seborrheic keratosis: Secondary | ICD-10-CM | POA: Diagnosis not present

## 2020-06-11 DIAGNOSIS — R002 Palpitations: Secondary | ICD-10-CM | POA: Diagnosis not present

## 2020-06-11 DIAGNOSIS — Z3202 Encounter for pregnancy test, result negative: Secondary | ICD-10-CM | POA: Diagnosis not present

## 2020-06-11 DIAGNOSIS — F419 Anxiety disorder, unspecified: Secondary | ICD-10-CM | POA: Diagnosis not present

## 2020-06-11 DIAGNOSIS — R9431 Abnormal electrocardiogram [ECG] [EKG]: Secondary | ICD-10-CM | POA: Diagnosis not present

## 2020-07-03 DIAGNOSIS — Z20822 Contact with and (suspected) exposure to covid-19: Secondary | ICD-10-CM | POA: Diagnosis not present

## 2020-07-13 DIAGNOSIS — Z20822 Contact with and (suspected) exposure to covid-19: Secondary | ICD-10-CM | POA: Diagnosis not present

## 2020-07-16 ENCOUNTER — Ambulatory Visit: Payer: Self-pay | Admitting: Cardiology

## 2020-07-16 NOTE — Progress Notes (Deleted)
Primary Physician/Referring:  Arnette Felts, FNP  Patient ID: Joyce Copa, female    DOB: [birth year], 44 y.o.   MRN: [id number]  No chief complaint on file.  HPI:    Olive Zmuda Scripter  is a 44 y.o. AA female with history of elevated blood pressure without diagnosis of hypertension, preeclampsia, obesity.  Referred by Arnette Felts, FNP for evaluation of chest discomfort.  ***Patient presents with primary complaint of chest discomfort.  She has had 2 ER visits for this discomfort and for palpitations in the last 3 months, during both visits to the ER work-up for acute coronary syndrome with negative and she was discharged.   Past Medical History:  Diagnosis Date  . Anemia   . Gestational hypertension   . Seborrheic dermatitis    Past Surgical History:  Procedure Laterality Date  . CESAREAN SECTION N/A 09/08/2018   Procedure: CESAREAN SECTION;  Surgeon: Essie Hart, MD;  Location: [facility] BIRTHING SUITES;  Service: Obstetrics;  Laterality: N/A;   Family History  Problem Relation Age of Onset  . Diabetes Mother   . Hypertension Mother   . Hypertension Father   . Hyperlipidemia Maternal Grandmother     Social History   Tobacco Use  . Smoking status: Former Smoker    Packs/day: 0.50    Quit date: 08/03/2013    Years since quitting: 6.9  . Smokeless tobacco: Never Used  Substance Use Topics  . Alcohol use: Not Currently    Comment: occasional   Marital Status: Married  ROS  ***ROS Objective  unknown if currently breastfeeding.  Vitals with BMI 05/27/2020 05/14/2020 05/14/2020  Height 5' 3.6" - -  Weight 214 lbs 3 oz - -  BMI 37.25 - -  Systolic 136 124 937  Diastolic 84 88 95  Pulse 76 88 94     ***Physical Exam Laboratory examination:   Recent Labs    10/22/19 1559 05/14/20 1950  NA 138 137  K 4.3 3.5  CL 101 102  CO2 21 24  GLUCOSE 88 98  BUN 14 20  CREATININE 0.85 0.84  CALCIUM 9.4 9.1  GFRNONAA 86 >60  GFRAA 99 >60   CrCl cannot be calculated (Patient's  most recent lab result is older than the maximum 21 days allowed.).  CMP Latest Ref Rng & Units 05/14/2020 10/22/2019 11/11/2018  Glucose 70 - 99 mg/dL 98 88 902(I)  BUN 6 - 20 mg/dL 20 14 14   Creatinine 0.44 - 1.00 mg/dL 0.97 3.53  Sodium 135 - 145 mmol/L 137 138 137  Potassium 3.5 - 5.1 mmol/L 3.5 4.3 3.6  Chloride 98 - 111 mmol/L 102 101 101  CO2 22 - 32 mmol/L 24 21 27   Calcium 8.9 - 10.3 mg/dL 9.1 9.4 9.7  Total Protein 6.0 - 8.5 g/dL - 8.1 2.99)  Total Bilirubin 0.0 - 1.2 mg/dL - 0.6  Alkaline Phos 39 - 117 IU/L - 87 80  AST 0 - 40 IU/L - 17 38  ALT 0 - 32 IU/L - 17 25   CBC Latest Ref Rng & Units 05/14/2020 01/24/2020 10/22/2019  WBC 4.0 - 10.5 K/uL 12.7(H) 10.1 11.0(H)  Hemoglobin 12.0 - 15.0 g/dL 11.9(L) 11.6 11.9  Hematocrit 36 - 46 % 36.7 34.0 36.4  Platelets 150 - 400 K/uL 309 319 380    Lipid Panel Recent Labs    10/22/19 1559  CHOL 188  TRIG 122  LDLCALC 109*  HDL 57  CHOLHDL 3.3    HEMOGLOBIN A1C  Lab Results  Component Value Date   HGBA1C 5.8 (H) 01/24/2020   TSH No results for input(s): TSH in the last 8760 hours.  External labs:   ***  Medications and allergies   Allergies  Allergen Reactions  . Pecan Extract Allergy Skin Test Itching    Pecan and walnuts makes Mouth itch  . Pollen Extract Itching    Itching, red eyes. sneezing     Outpatient Medications Prior to Visit  Medication Sig Dispense Refill  . Carbinoxamine Maleate (RYVENT) 6 MG TABS Take 1 tablet by mouth in the morning and at bedtime. 120 tablet 1  . Multiple Vitamin (MULTIVITAMIN) capsule Take 1 capsule by mouth daily.    . Multiple Vitamins-Minerals (ZINC PO) Take by mouth daily. (Patient not taking: Reported on 05/26/2020)    . Pyridoxine HCl (VITAMIN B6 PO) Take by mouth daily.    Marland Kitchen VITAMIN D PO Take by mouth. Take one tablet daily     No facility-administered medications prior to visit.     Radiology:   No results found.  Cardiac Studies:    *** EKG  ***  ***  Assessment  No diagnosis found.   There are no discontinued medications.  No orders of the defined types were placed in this encounter.   Recommendations:   Kechia Yahnke Facer is a 44 y.o. ***    Rayford Halsted, MD, [facility] 07/16/2020, 11:38 AM Office: [phone]

## 2020-07-28 ENCOUNTER — Ambulatory Visit: Payer: BC Managed Care – PPO | Admitting: Nurse Practitioner

## 2020-08-06 ENCOUNTER — Encounter: Payer: Self-pay | Admitting: Nurse Practitioner

## 2020-08-06 ENCOUNTER — Other Ambulatory Visit: Payer: Self-pay

## 2020-08-06 ENCOUNTER — Ambulatory Visit (INDEPENDENT_AMBULATORY_CARE_PROVIDER_SITE_OTHER): Payer: BC Managed Care – PPO | Admitting: Nurse Practitioner

## 2020-08-06 VITALS — BP 126/80 | HR 83 | Temp 98.3°F | Ht 62.4 in | Wt 210.2 lb

## 2020-08-06 DIAGNOSIS — H1033 Unspecified acute conjunctivitis, bilateral: Secondary | ICD-10-CM

## 2020-08-06 DIAGNOSIS — R0981 Nasal congestion: Secondary | ICD-10-CM | POA: Diagnosis not present

## 2020-08-06 DIAGNOSIS — J029 Acute pharyngitis, unspecified: Secondary | ICD-10-CM

## 2020-08-06 NOTE — Progress Notes (Signed)
This visit occurred during the SARS-CoV-2 public health emergency.  Safety protocols were in place, including screening questions prior to the visit, additional usage of staff PPE, and extensive cleaning of exam room while observing appropriate contact time as indicated for disinfecting solutions.  Subjective:     Patient ID: Miranda Shah , female    DOB: 01/03/1976 , 44 y.o.   MRN: [id number]   Chief Complaint  Patient presents with  . eye concerns    patient stated her baby recently had hand,foot and mouth and now her eyes are draining and she has had a slight cough at night     HPI  She has recently purchased a humidifier and air purifier.   Her son had hand, foot mouth with some eye drainage.  He was given drops for his eyes.   The night before she felt like she was having an allergy to her eyes, when she woke up yesterday she had swollen eyelids, matting to eyes. She had some blurred vision  This morning when she blew her nose this morning and having a hoarse throat.  She is having episodes when she has a compulsion to cough but does not have a full cough.   She was tested for covid last on 9/20.      Past Medical History:  Diagnosis Date  . Anemia   . Gestational hypertension   . Seborrheic dermatitis      Family History  Problem Relation Age of Onset  . Diabetes Mother   . Hypertension Mother   . Hypertension Father   . Hyperlipidemia Maternal Grandmother      Current Outpatient Medications:  Marland Kitchen  Multiple Vitamin (MULTIVITAMIN) capsule, Take 1 capsule by mouth daily., Disp: , Rfl:  .  Multiple Vitamins-Minerals (ZINC PO), Take by mouth daily. , Disp: , Rfl:  .  ofloxacin (OCUFLOX) 0.3 % ophthalmic solution, Place 1 drop into both eyes every 6 (six) hours as needed., Disp: , Rfl:  .  Pyridoxine HCl (VITAMIN B6 PO), Take by mouth daily., Disp: , Rfl:  .  VITAMIN D PO, Take by mouth. Take one tablet daily, Disp: , Rfl:  .  Carbinoxamine Maleate (RYVENT) 6 MG TABS, Take  1 tablet by mouth in the morning and at bedtime. (Patient not taking: Reported on 08/06/2020), Disp: 120 tablet, Rfl: 1   Allergies  Allergen Reactions  . Pecan Extract Allergy Skin Test Itching    Pecan and walnuts makes Mouth itch  . Pollen Extract Itching    Itching, red eyes. sneezing     Review of Systems  Constitutional: Negative.  Negative for fatigue.  Eyes: Positive for pain, discharge, redness and itching. Negative for visual disturbance.  Respiratory: Negative for cough and wheezing.   Cardiovascular: Negative.   Neurological: Negative for dizziness and headaches.  Psychiatric/Behavioral: Negative.      Today's Vitals   08/06/20 0848  BP: 126/80  Pulse: 83  Temp: 98.3 F (36.8 C)  TempSrc: Oral  Weight: 210 lb 3.2 oz (95.3 kg)  Height: 5' 2.4" (1.585 m)  PainSc: 0-No pain   Body mass index is 37.95 kg/m.   Objective:  Physical Exam Constitutional:      General: She is not in acute distress.    Appearance: Normal appearance.  HENT:     Right Ear: Tympanic membrane, ear canal and external ear normal. There is no impacted cerumen.     Left Ear: Tympanic membrane, ear canal and external ear normal. There is  no impacted cerumen.     Nose: Nose normal.  Eyes:     General:        Right eye: No discharge.        Left eye: No discharge.     Comments: Bilateral sclera is slightly erythematous   Cardiovascular:     Rate and Rhythm: Normal rate and regular rhythm.     Pulses: Normal pulses.     Heart sounds: Normal heart sounds. No murmur heard.   Pulmonary:     Effort: Pulmonary effort is normal. No respiratory distress.     Breath sounds: Normal breath sounds.  Neurological:     General: No focal deficit present.     Mental Status: She is alert and oriented to person, place, and time.  Psychiatric:        Mood and Affect: Mood normal.        Behavior: Behavior normal.        Thought Content: Thought content normal.        Judgment: Judgment normal.          Assessment And Plan:     1. Acute conjunctivitis of both eyes, unspecified acute conjunctivitis type  Mild erythema to sclera bilaterally, continue with antibiotic eye drops  Educated on good handwashing  2. Sore throat  Likely related to post nasal drainage - Novel Coronavirus, NAA ([facility])  3. Nasal congestion  She is encouraged to use a nasal spray daily  Will also check for covid and she is to remain at home for work until Friday unless she has a positive covid test - Novel Coronavirus, NAA ([facility])     Patient was given opportunity to ask questions. Patient verbalized understanding of the plan and was able to repeat key elements of the plan. All questions were answered to their satisfaction.   Jeanell Sparrow, FNP, have reviewed all documentation for this visit. The documentation on 08/06/20 for the exam, diagnosis, procedures, and orders are all accurate and complete.  THE PATIENT IS ENCOURAGED TO PRACTICE SOCIAL DISTANCING DUE TO THE COVID-19 PANDEMIC.

## 2020-08-06 NOTE — Patient Instructions (Signed)
Person Under Monitoring Name: Miranda Shah  Location: [address] [place] [address]   Infection Prevention Recommendations for Individuals Confirmed to have, or Being Evaluated for, 2019 Novel Coronavirus (COVID-19) Infection Who Receive Care at Home  Individuals who are confirmed to have, or are being evaluated for, COVID-19 should follow the prevention steps below until a healthcare provider or local or state health department says they can return to normal activities.  Stay home except to get medical care You should restrict activities outside your home, except for getting medical care. Do not go to work, school, or public areas, and do not use public transportation or taxis.  Call ahead before visiting your doctor Before your medical appointment, call the healthcare provider and tell them that you have, or are being evaluated for, COVID-19 infection. This will help the healthcare provider's office take steps to keep other people from getting infected. Ask your healthcare provider to call the local or state health department.  Monitor your symptoms Seek prompt medical attention if your illness is worsening (e.g., difficulty breathing). Before going to your medical appointment, call the healthcare provider and tell them that you have, or are being evaluated for, COVID-19 infection. Ask your healthcare provider to call the local or state health department.  Wear a facemask You should wear a facemask that covers your nose and mouth when you are in the same room with other people and when you visit a healthcare provider. People who live with or visit you should also wear a facemask while they are in the same room with you.  Separate yourself from other people in your home As much as possible, you should stay in a different room from other people in your home. Also, you should use a separate bathroom, if available.  Avoid sharing household items You should  not share dishes, drinking glasses, cups, eating utensils, towels, bedding, or other items with other people in your home. After using these items, you should wash them thoroughly with soap and water.  Cover your coughs and sneezes Cover your mouth and nose with a tissue when you cough or sneeze, or you can cough or sneeze into your sleeve. Throw used tissues in a lined trash can, and immediately wash your hands with soap and water for at least 20 seconds or use an alcohol-based hand rub.  Wash your [employer] your hands often and thoroughly with soap and water for at least 20 seconds. You can use an alcohol-based hand sanitizer if soap and water are not available and if your hands are not visibly dirty. Avoid touching your eyes, nose, and mouth with unwashed hands.   Prevention Steps for Caregivers and Household Members of Individuals Confirmed to have, or Being Evaluated for, COVID-19 Infection Being Cared for in the Home  If you live with, or provide care at home for, a person confirmed to have, or being evaluated for, COVID-19 infection please follow these guidelines to prevent infection:  Follow healthcare provider's instructions Make sure that you understand and can help the patient follow any healthcare provider instructions for all care.  Provide for the patient's basic needs You should help the patient with basic needs in the home and provide support for getting groceries, prescriptions, and other personal needs.  Monitor the patient's symptoms If they are getting sicker, call his or her medical provider and tell them that the patient has, or is being evaluated for, COVID-19 infection. This will help the healthcare  provider's office take steps to keep other people from getting infected. Ask the healthcare provider to call the local or state health department.  Limit the number of people who have contact with the patient  If possible, have only one caregiver for the  patient.  Other household members should stay in another home or place of residence. If this is not possible, they should stay  in another room, or be separated from the patient as much as possible. Use a separate bathroom, if available.  Restrict visitors who do not have an essential need to be in the home.  Keep older adults, very young children, and other sick people away from the patient Keep older adults, very young children, and those who have compromised immune systems or chronic health conditions away from the patient. This includes people with chronic heart, lung, or kidney conditions, diabetes, and cancer.  Ensure good ventilation Make sure that shared spaces in the home have good air flow, such as from an air conditioner or an opened window, weather permitting.  Wash your hands often  Wash your hands often and thoroughly with soap and water for at least 20 seconds. You can use an alcohol based hand sanitizer if soap and water are not available and if your hands are not visibly dirty.  Avoid touching your eyes, nose, and mouth with unwashed hands.  Use disposable paper towels to dry your hands. If not available, use dedicated cloth towels and replace them when they become wet.  Wear a facemask and gloves  Wear a disposable facemask at all times in the room and gloves when you touch or have contact with the patient's blood, body fluids, and/or secretions or excretions, such as sweat, saliva, sputum, nasal mucus, vomit, urine, or feces.  Ensure the mask fits over your nose and mouth tightly, and do not touch it during use.  Throw out disposable facemasks and gloves after using them. Do not reuse.  Wash your hands immediately after removing your facemask and gloves.  If your personal clothing becomes contaminated, carefully remove clothing and launder. Wash your hands after handling contaminated clothing.  Place all used disposable facemasks, gloves, and other waste in a lined  container before disposing them with other household waste.  Remove gloves and wash your hands immediately after handling these items.  Do not share dishes, glasses, or other household items with the patient  Avoid sharing household items. You should not share dishes, drinking glasses, cups, eating utensils, towels, bedding, or other items with a patient who is confirmed to have, or being evaluated for, COVID-19 infection.  After the person uses these items, you should wash them thoroughly with soap and water.  Wash laundry thoroughly  Immediately remove and wash clothes or bedding that have blood, body fluids, and/or secretions or excretions, such as sweat, saliva, sputum, nasal mucus, vomit, urine, or feces, on them.  Wear gloves when handling laundry from the patient.  Read and follow directions on labels of laundry or clothing items and detergent. In general, wash and dry with the warmest temperatures recommended on the label.  Clean all areas the individual has used often  Clean all touchable surfaces, such as counters, tabletops, doorknobs, bathroom fixtures, toilets, phones, keyboards, tablets, and bedside tables, every day. Also, clean any surfaces that may have blood, body fluids, and/or secretions or excretions on them.  Wear gloves when cleaning surfaces the patient has come in contact with.  Use a diluted bleach solution (e.g., dilute bleach with  1 part bleach and 10 parts water) or a household disinfectant with a label that says EPA-registered for coronaviruses. To make a bleach solution at home, add 1 tablespoon of bleach to 1 quart (4 cups) of water. For a larger supply, add  cup of bleach to 1 gallon (16 cups) of water.  Read labels of cleaning products and follow recommendations provided on product labels. Labels contain instructions for safe and effective use of the cleaning product including precautions you should take when applying the product, such as wearing gloves or  eye protection and making sure you have good ventilation during use of the product.  Remove gloves and wash hands immediately after cleaning.  Monitor yourself for signs and symptoms of illness Caregivers and household members are considered close contacts, should monitor their health, and will be asked to limit movement outside of the home to the extent possible. Follow the monitoring steps for close contacts listed on the symptom monitoring form.   ? If you have additional questions, contact your local health department or call the epidemiologist on call at [phone] (available 24/7). ? This guidance is subject to change. For the most up-to-date guidance from [facility], please refer to their website: [email or web address]

## 2020-08-07 LAB — SARS-COV-2, NAA 2 DAY TAT

## 2020-08-07 LAB — NOVEL CORONAVIRUS, NAA: SARS-CoV-2, NAA: NOT DETECTED

## 2020-08-21 ENCOUNTER — Other Ambulatory Visit: Payer: Self-pay

## 2020-08-21 ENCOUNTER — Ambulatory Visit: Payer: BC Managed Care – PPO | Admitting: Cardiology

## 2020-08-21 ENCOUNTER — Encounter: Payer: Self-pay | Admitting: Cardiology

## 2020-08-21 VITALS — BP 125/85 | HR 96 | Resp 16 | Ht 62.4 in | Wt 213.0 lb

## 2020-08-21 DIAGNOSIS — E6609 Other obesity due to excess calories: Secondary | ICD-10-CM

## 2020-08-21 DIAGNOSIS — Z124 Encounter for screening for malignant neoplasm of cervix: Secondary | ICD-10-CM | POA: Diagnosis not present

## 2020-08-21 DIAGNOSIS — R9431 Abnormal electrocardiogram [ECG] [EKG]: Secondary | ICD-10-CM

## 2020-08-21 DIAGNOSIS — E785 Hyperlipidemia, unspecified: Secondary | ICD-10-CM

## 2020-08-21 DIAGNOSIS — R0789 Other chest pain: Secondary | ICD-10-CM | POA: Diagnosis not present

## 2020-08-21 DIAGNOSIS — R002 Palpitations: Secondary | ICD-10-CM

## 2020-08-21 DIAGNOSIS — Z6838 Body mass index (BMI) 38.0-38.9, adult: Secondary | ICD-10-CM

## 2020-08-21 NOTE — Progress Notes (Signed)
Primary Physician/Referring:  Arnette Felts, FNP  Patient ID: Miranda Shah, female    DOB: 1975-11-23, 44 y.o.   MRN: [id number]  Chief Complaint  Patient presents with  . Chest Discomfort  . New Patient (Initial Visit)    Referred by  Richardson Chiquito, FNP   HPI:    Miranda Shah  is a 44 y.o. obese AA female with history of preeclampsia and elevated blood pressure who was referred to our office by Arnette Felts, FNP for evaluation of palpitations.  Patient states that over the last several months she has had occasional episodes of palpitations, reports her heart "flip-flops".  This comes on spontaneously, lasts several seconds to a few minutes and resolves on its own.  These episodes are not associated with chest pain, shortness of breath, dizziness, syncope.  Of note patient reports high stress job as she works in the Arts development officer at [employer], and cares for young son at home.   Past Medical History:  Diagnosis Date  . Anemia   . Gestational hypertension   . Seborrheic dermatitis    Past Surgical History:  Procedure Laterality Date  . CESAREAN SECTION N/A 09/08/2018   Procedure: CESAREAN SECTION;  Surgeon: Essie Hart, MD;  Location: [facility] SU[facility];  Service: Obstetrics;  Laterality: N/A;   Family History  Problem Relation Age of Onset  . Diabetes Mother   . Hypertension Mother   . Hypertension Father   . Hyperlipidemia Maternal Grandmother     Social History   Tobacco Use  . Smoking status: Former Smoker    Packs/day: 0.50    Types: Cigarettes    Quit date: 08/03/2013    Years since quitting: 7.0  . Smokeless tobacco: Never Used  Substance Use Topics  . Alcohol use: Not Currently    Comment: occasional   Marital Status: Married  ROS  Review of Systems  Constitutional: Negative for malaise/fatigue and weight gain.  Cardiovascular: Positive for palpitations. Negative for chest pain, claudication, leg swelling, near-syncope, orthopnea, paroxysmal  nocturnal dyspnea and syncope.  Respiratory: Negative for shortness of breath.   Hematologic/Lymphatic: Does not bruise/bleed easily.  Gastrointestinal: Negative for melena.  Neurological: Negative for dizziness and weakness.   Objective  Blood pressure 125/85, pulse 96, resp. rate 16, height 5' 2.4" (1.585 m), weight 213 lb (96.6 kg), last menstrual period 07/30/2020, SpO2 98 %, unknown if currently breastfeeding.  Vitals with BMI 08/21/2020 08/06/2020 05/27/2020  Height 5' 2.4" 5' 2.4" 5' 3.6"  Weight 213 lbs 210 lbs 3 oz 214 lbs 3 oz  BMI 38.46 37.95 37.25  Systolic 125 126 182  Diastolic 85 80 84  Pulse 96 83 76     Physical Exam Vitals reviewed.  Constitutional:      Appearance: She is obese.  HENT:     Head: Normocephalic and atraumatic.  Cardiovascular:     Rate and Rhythm: Normal rate and regular rhythm.     Pulses: Intact distal pulses.     Heart sounds: S1 normal and S2 normal. No murmur heard.  No gallop.   Pulmonary:     Effort: Pulmonary effort is normal. No respiratory distress.     Breath sounds: No wheezing, rhonchi or rales.  Musculoskeletal:     Right lower leg: No edema.     Left lower leg: No edema.  Neurological:     Mental Status: She is alert.    Laboratory examination:   Recent Labs    10/22/19 1559 05/14/20  1950  NA 138 137  K 4.3 3.5  CL 101 102  CO2 21 24  GLUCOSE 88 98  BUN 14 20  CREATININE 0.85 0.84  CALCIUM 9.4 9.1  GFRNONAA 86 >60  GFRAA 99 >60   CrCl cannot be calculated (Patient's most recent lab result is older than the maximum 21 days allowed.).  CMP Latest Ref Rng & Units 05/14/2020 10/22/2019 11/11/2018  Glucose 70 - 99 mg/dL 98 88 580(D)  BUN 6 - 20 mg/dL 20 14 14   Creatinine 0.44 - 1.00 mg/dL 9.83 3.82  Sodium 135 - 145 mmol/L 137 138 137  Potassium 3.5 - 5.1 mmol/L 3.5 4.3 3.6  Chloride 98 - 111 mmol/L 102 101 101  CO2 22 - 32 mmol/L 24 21 27   Calcium 8.9 - 10.3 mg/dL 9.1 9.4 9.7  Total Protein 6.0 - 8.5 g/dL -  8.1 5.05)  Total Bilirubin 0.0 - 1.2 mg/dL - 0.6  Alkaline Phos 39 - 117 IU/L - 87 80  AST 0 - 40 IU/L - 17 38  ALT 0 - 32 IU/L - 17 25   CBC Latest Ref Rng & Units 05/14/2020 01/24/2020 10/22/2019  WBC 4.0 - 10.5 K/uL 12.7(H) 10.1 11.0(H)  Hemoglobin 12.0 - 15.0 g/dL 11.9(L) 11.6 11.9  Hematocrit 36 - 46 % 36.7 34.0 36.4  Platelets 150 - 400 K/uL 309 319 380    Lipid Panel Recent Labs    10/22/19 1559  CHOL 188  TRIG 122  LDLCALC 109*  HDL 57  CHOLHDL 3.3  NonHDL 131  HEMOGLOBIN A1C Lab Results  Component Value Date   HGBA1C 5.8 (H) 01/24/2020   TSH No results for input(s): TSH in the last 8760 hours.  External labs:  06/11/2020: TSH 1.19   Medications and allergies   Allergies  Allergen Reactions  . Pecan Extract Allergy Skin Test Itching    Pecan and walnuts makes Mouth itch  . Pollen Extract Itching    Itching, red eyes. sneezing     Outpatient Medications Prior to Visit  Medication Sig Dispense Refill  . Multiple Vitamin (MULTIVITAMIN) capsule Take 1 capsule by mouth daily.    . Multiple Vitamins-Minerals (ZINC PO) Take by mouth daily.     . Pyridoxine HCl (VITAMIN B6 PO) Take by mouth daily. (Patient not taking: Reported on 08/21/2020)    . VITAMIN D PO Take by mouth. Take one tablet daily (Patient not taking: Reported on 08/21/2020)    . Carbinoxamine Maleate (RYVENT) 6 MG TABS Take 1 tablet by mouth in the morning and at bedtime. (Patient not taking: Reported on 08/06/2020) 120 tablet 1  . ofloxacin (OCUFLOX) 0.3 % ophthalmic solution Place 1 drop into both eyes every 6 (six) hours as needed.     No facility-administered medications prior to visit.    Radiology:   No results found. Chest x-ray PA and lateral 06/11/2020: Cardiovascular: Cardiac silhouette and pulmonary vasculature are within normal limits.  Mediastinum: Within normal limits.  Lungs/pleura: Clear clear lungs.. No pleural effusion or pneumothorax.  Upper abdomen: Visualized  portions are unremarkable.  Chest wall/osseous structures: Unremarkable.  Cardiac Studies:   Has not undergone previous cardiac studies.   EKG:   EKG 08/21/2020: Normal sinus rhythm at rate of 85 bpm, normal axis.  Poor R wave progression, cannot exclude anteroseptal infarct old.  Low-voltage complexes. Compared to May 14, 2020, sinus tachycardia at the rate of 111 bpm no longer present.  Borderline low voltage complexes were present.  Assessment  ICD-10-CM   1. Palpitations  R00.2 EKG 12-Lead  2. Nonspecific abnormal electrocardiogram (ECG) (EKG)  R94.31   3. Class 2 obesity due to excess calories without serious comorbidity with body mass index (BMI) of 38.0 to 38.9 in adult  E66.09    Z68.38   4. Mild hyperlipidemia  E78.5      Medications Discontinued During This Encounter  Medication Reason  . Carbinoxamine Maleate (RYVENT) 6 MG TABS Patient Preference  . ofloxacin (OCUFLOX) 0.3 % ophthalmic solution Completed Course    No orders of the defined types were placed in this encounter.   Recommendations:   Miranda Shah is a 44 y.o. obese AA female with history of preeclampsia and elevated blood pressure who was referred to our office by Arnette Felts, FNP for evaluation of palpitations.  Patient presents for evaluation of palpitations.  EKG today revealed normal sinus rhythm at a rate of 85 bpm.  Patient symptoms are suggestive of PACs and PVCs as they are brief, resolve spontaneously, are associated with stress, and patient denies angina, shortness of breath.  However will obtain echocardiogram to evaluate structure and function of the heart. Discussed discussed at length with patient that obesity is likely contributing to symptoms.  Educated her regarding low calorie healthy diet and lifestyle modifications and stressed the importance of weight loss in order to improve symptoms as well as reduce cardiovascular risks. Reviewed weight loss strategies as well as provided exercise  suggestions. Also discussed the importance of stress reduction.   Personally reviewed external labs.  Lipids are elevated, however do not feel statin therapy is necessary at this time as patient is motivated to make lifestyle modifications and improve her diet.  Will defer further lipid management to PCP, recommend rechecking to reevaluate necessity statin therapy in the future.  Patient's blood pressure is currently well controlled.  If echocardiogram is without significant abnormalities, will follow up on an as-needed basis.    This was a 60-minute encounter with face-to-face counseling, medical records review, coordination of care, patient education regarding medical issues/diet/lifestyle modifications/cardiovascular risk reduction.   Patient was seen in collaboration with Dr. Jacinto Halim. He also reviewed patient's chart and examined the patient. Dr. Jacinto Halim is in agreement of the plan.    Rayford Halsted, PA-C 08/22/2020, 4:16 PM Office: [phone]

## 2020-08-23 LAB — HM PAP SMEAR

## 2020-09-02 ENCOUNTER — Ambulatory Visit: Payer: BC Managed Care – PPO

## 2020-09-02 ENCOUNTER — Other Ambulatory Visit: Payer: Self-pay

## 2020-09-02 DIAGNOSIS — R9431 Abnormal electrocardiogram [ECG] [EKG]: Secondary | ICD-10-CM | POA: Diagnosis not present

## 2020-10-23 ENCOUNTER — Encounter: Payer: BC Managed Care – PPO | Admitting: Nurse Practitioner

## 2020-10-23 NOTE — Progress Notes (Signed)
Patient was not seen.

## 2020-10-26 ENCOUNTER — Encounter: Payer: Self-pay | Admitting: Nurse Practitioner

## 2020-11-07 ENCOUNTER — Ambulatory Visit: Payer: BC Managed Care – PPO | Attending: Internal Medicine

## 2020-11-07 DIAGNOSIS — Z23 Encounter for immunization: Secondary | ICD-10-CM

## 2020-11-07 NOTE — Progress Notes (Signed)
   Covid-19 Vaccination Clinic  Name:  LATRIA MCCARRON    MRN: [id number] DOB: [birth year]  11/07/2020  Ms. Benegas was observed post Covid-19 immunization for 15 minutes without incident. She was provided with Vaccine Information Sheet and instruction to access the V-Safe system.   Ms. Argueta was instructed to call 911 with any severe reactions post vaccine: Marland Kitchen Difficulty breathing  . Swelling of face and throat  . A fast heartbeat  . A bad rash all over body  . Dizziness and weakness   Immunizations Administered    Name Date Dose VIS Date Route   Pfizer COVID-19 Vaccine 11/07/2020  3:58 PM 0.3 mL 08/20/2020 Intramuscular   Manufacturer: [employer], [employer]   Lot: [id number]   NDC: [address]-4

## 2021-01-18 DIAGNOSIS — J3489 Other specified disorders of nose and nasal sinuses: Secondary | ICD-10-CM | POA: Diagnosis not present

## 2021-02-05 DIAGNOSIS — Z86010 Personal history of colonic polyps: Secondary | ICD-10-CM | POA: Diagnosis not present

## 2021-02-05 DIAGNOSIS — Z1211 Encounter for screening for malignant neoplasm of colon: Secondary | ICD-10-CM | POA: Diagnosis not present

## 2021-02-05 DIAGNOSIS — F419 Anxiety disorder, unspecified: Secondary | ICD-10-CM | POA: Diagnosis not present

## 2021-03-18 ENCOUNTER — Ambulatory Visit (INDEPENDENT_AMBULATORY_CARE_PROVIDER_SITE_OTHER): Payer: BC Managed Care – PPO | Admitting: Nurse Practitioner

## 2021-03-18 ENCOUNTER — Other Ambulatory Visit: Payer: Self-pay

## 2021-03-18 ENCOUNTER — Encounter: Payer: Self-pay | Admitting: Nurse Practitioner

## 2021-03-18 VITALS — BP 118/74 | HR 76 | Temp 98.1°F | Ht 62.2 in | Wt 211.6 lb

## 2021-03-18 DIAGNOSIS — Z0000 Encounter for general adult medical examination without abnormal findings: Secondary | ICD-10-CM

## 2021-03-18 DIAGNOSIS — R7303 Prediabetes: Secondary | ICD-10-CM

## 2021-03-18 DIAGNOSIS — R200 Anesthesia of skin: Secondary | ICD-10-CM | POA: Diagnosis not present

## 2021-03-18 DIAGNOSIS — E669 Obesity, unspecified: Secondary | ICD-10-CM

## 2021-03-18 DIAGNOSIS — Z6838 Body mass index (BMI) 38.0-38.9, adult: Secondary | ICD-10-CM

## 2021-03-18 NOTE — Progress Notes (Signed)
I,Yamilka Roman [employer] as a Education administrator for [employer], FNP.,have documented all relevant documentation on the behalf of Minette Brine, FNP,as directed by  Minette Brine, FNP while in the presence of Minette Brine, [place]. This visit occurred during the SARS-CoV-2 public health emergency.  Safety protocols were in place, including screening questions prior to the visit, additional usage of staff PPE, and extensive cleaning of exam room while observing appropriate contact time as indicated for disinfecting solutions.  Subjective:     Patient ID: Miranda Shah , female    DOB: 08-23-1976 , 45 y.o.   MRN: [id number]   Chief Complaint  Patient presents with  . Annual Exam    HPI  Patient here for hm.  Dr. Gabriel Rung [facility]. She is interested in birth control but would like one that does not increase risk for strokes.   Wt Readings from Last 3 Encounters: 03/18/21 : 211 lb 9.6 oz (96 kg) 08/21/20 : 213 lb (96.6 kg) 08/06/20 : 210 lb 3.2 oz (95.3 kg)    Past Medical History:  Diagnosis Date  . Anemia   . Gestational hypertension   . Seborrheic dermatitis      Family History  Problem Relation Age of Onset  . Diabetes Mother   . Hypertension Mother   . Hypertension Father   . Hyperlipidemia Maternal Grandmother      Current Outpatient Medications:  Marland Kitchen  Multiple Vitamin (MULTIVITAMIN) capsule, Take 1 capsule by mouth daily., Disp: , Rfl:    Allergies  Allergen Reactions  . Pecan Extract Allergy Skin Test Itching    Pecan and walnuts makes Mouth itch  . Pollen Extract Itching    Itching, red eyes. sneezing      The patient states she uses none for birth control.  No LMP recorded.. Negative for Dysmenorrhea and Negative for Menorrhagia. Negative for: breast discharge, breast lump(s), breast pain and breast self exam. Associated symptoms include abnormal vaginal bleeding. Pertinent negatives include abnormal bleeding (hematology), anxiety, decreased libido,  depression, difficulty falling sleep, dyspareunia, history of infertility, nocturia, sexual dysfunction, sleep disturbances, urinary incontinence, urinary urgency, vaginal discharge and vaginal itching. Diet regular - she feels she could plan more, she does have a toddler . The patient states her exercise level is moderate - she did a 6 workout program. She noticed when she walked before she was having back pain after doing the exercise regimen she does not have the back pain.   The patient's tobacco use is:  Social History   Tobacco Use  Smoking Status Former Smoker  . Packs/day: 0.50  . Types: Cigarettes  . Quit date: 08/03/2013  . Years since quitting: 7.6  Smokeless Tobacco Never Used   She has been exposed to passive smoke. The patient's alcohol use is:  Social History   Substance and Sexual Activity  Alcohol Use Not Currently   Comment: occasional   Additional information: Last pap 08/2020, next one scheduled for 2023  Review of Systems  Constitutional: Negative.   HENT: Negative.   Eyes: Negative.   Respiratory: Negative.   Cardiovascular: Negative.  Negative for chest pain, palpitations and leg swelling.  Gastrointestinal: Negative.   Endocrine: Negative.   Genitourinary: Negative.   Musculoskeletal: Negative.   Skin: Negative.   Allergic/Immunologic: Negative.   Neurological: Negative.   Hematological: Negative.   Psychiatric/Behavioral: Negative.      Today's Vitals   03/18/21 0850  Temp: 98.1 F (36.7 C)  Weight: 211 lb 9.6 oz (96  kg)  Height: 5' 2.2" (1.58 m)  PainSc: 0-No pain   Body mass index is 38.45 kg/m.   Objective:  Physical Exam Vitals reviewed.  Constitutional:      General: She is not in acute distress.    Appearance: Normal appearance. She is well-developed. She is obese.  HENT:     Head: Normocephalic and atraumatic.     Right Ear: Hearing, tympanic membrane, ear canal and external ear normal. There is no impacted cerumen.     Left  Ear: Hearing, tympanic membrane, ear canal and external ear normal. There is no impacted cerumen.     Nose:     Comments: Deferred - masked    Mouth/Throat:     Comments: Deferred - masked Eyes:     General: Lids are normal.     Extraocular Movements: Extraocular movements intact.     Conjunctiva/sclera: Conjunctivae normal.     Pupils: Pupils are equal, round, and reactive to light.     Funduscopic exam:    Right eye: No papilledema.        Left eye: No papilledema.  Neck:     Thyroid: No thyroid mass.     Vascular: No carotid bruit.  Cardiovascular:     Rate and Rhythm: Normal rate and regular rhythm.     Pulses: Normal pulses.     Heart sounds: Normal heart sounds. No murmur heard.   Pulmonary:     Effort: Pulmonary effort is normal. No respiratory distress.     Breath sounds: Normal breath sounds. No wheezing.  Chest:     Chest wall: No mass.  Breasts:     Tanner Score is 5.     Right: Normal. No mass, tenderness, axillary adenopathy or supraclavicular adenopathy.     Left: Normal. No mass, tenderness, axillary adenopathy or supraclavicular adenopathy.    Abdominal:     General: Abdomen is flat. Bowel sounds are normal. There is no distension.     Palpations: Abdomen is soft.     Tenderness: There is no abdominal tenderness.  Genitourinary:    Rectum: Guaiac result negative.  Musculoskeletal:        General: No swelling. Normal range of motion.     Cervical back: Full passive range of motion without pain, normal range of motion and neck supple.     Right lower leg: No edema.     Left lower leg: No edema.  Lymphadenopathy:     Upper Body:     Right upper body: No supraclavicular, axillary or pectoral adenopathy.     Left upper body: No supraclavicular, axillary or pectoral adenopathy.  Skin:    General: Skin is warm and dry.     Capillary Refill: Capillary refill takes less than 2 seconds.  Neurological:     General: No focal deficit present.     Mental Status:  She is alert and oriented to person, place, and time.     Cranial Nerves: No cranial nerve deficit.     Sensory: No sensory deficit.     Motor: No weakness.  Psychiatric:        Mood and Affect: Mood normal.        Behavior: Behavior normal.        Thought Content: Thought content normal.        Judgment: Judgment normal.         Assessment And Plan:     1. Encounter for general adult medical examination w/o abnormal findings .  Behavior modifications discussed and diet history reviewed.   . Pt will continue to exercise regularly and modify diet with low GI, plant based foods and decrease intake of processed foods.  . Recommend intake of daily multivitamin, Vitamin D, and calcium.  . Recommend mammogram and colonoscopy for preventive screenings, as well as recommend immunizations that include influenza, TDAP, and Shingles - Lipid panel - CMP14+EGFR - CBC  2. Obesity (BMI 35.0-39.9 without comorbidity) Chronic Discussed healthy diet and regular exercise options  Encouraged to exercise at least 150 minutes per week with 2 days of strength training She is encouraged to decrease her intake of carbs and sugars - Insulin, random  3. Numbness in both hands  Will check metabolic causes.  She has equal strength bilaterally - TSH - Vitamin B12 - VITAMIN D 25 Hydroxy (Vit-D Deficiency, Fractures)  4. Prediabetes  Chronic, stable  Will consider starting metformin pending lab results  Discussed importance of eating a low carbohydrate meals - Hemoglobin A1c - Insulin, random     Patient was given opportunity to ask questions. Patient verbalized understanding of the plan and was able to repeat key elements of the plan. All questions were answered to their satisfaction.   Minette Brine, FNP   I, Minette Brine, FNP, have reviewed all documentation for this visit. The documentation on 03/18/21 for the exam, diagnosis, procedures, and orders are all accurate and complete.   THE  PATIENT IS ENCOURAGED TO PRACTICE SOCIAL DISTANCING DUE TO THE COVID-19 PANDEMIC.

## 2021-03-18 NOTE — Patient Instructions (Signed)
Health Maintenance, Female Adopting a healthy lifestyle and getting preventive care are important in promoting health and wellness. Ask your health care provider about:  The right schedule for you to have regular tests and exams.  Things you can do on your own to prevent diseases and keep yourself healthy. What should I know about diet, weight, and exercise? Eat a healthy diet  Eat a diet that includes plenty of vegetables, fruits, low-fat dairy products, and lean protein.  Do not eat a lot of foods that are high in solid fats, added sugars, or sodium.   Maintain a healthy weight Body mass index (BMI) is used to identify weight problems. It estimates body fat based on height and weight. Your health care provider can help determine your BMI and help you achieve or maintain a healthy weight. Get regular exercise Get regular exercise. This is one of the most important things you can do for your health. Most adults should:  Exercise for at least 150 minutes each week. The exercise should increase your heart rate and make you sweat (moderate-intensity exercise).  Do strengthening exercises at least twice a week. This is in addition to the moderate-intensity exercise.  Spend less time sitting. Even light physical activity can be beneficial. Watch cholesterol and blood lipids Have your blood tested for lipids and cholesterol at 43 years of age, then have this test every 5 years. Have your cholesterol levels checked more often if:  Your lipid or cholesterol levels are high.  You are older than 43 years of age.  You are at high risk for heart disease. What should I know about cancer screening? Depending on your health history and family history, you may need to have cancer screening at various ages. This may include screening for:  Breast cancer.  Cervical cancer.  Colorectal cancer.  Skin cancer.  Lung cancer. What should I know about heart disease, diabetes, and high blood  pressure? Blood pressure and heart disease  High blood pressure causes heart disease and increases the risk of stroke. This is more likely to develop in people who have high blood pressure readings, are of African descent, or are overweight.  Have your blood pressure checked: ? Every 3-5 years if you are 18-39 years of age. ? Every year if you are 40 years old or older. Diabetes Have regular diabetes screenings. This checks your fasting blood sugar level. Have the screening done:  Once every three years after age 40 if you are at a normal weight and have a low risk for diabetes.  More often and at a younger age if you are overweight or have a high risk for diabetes. What should I know about preventing infection? Hepatitis B If you have a higher risk for hepatitis B, you should be screened for this virus. Talk with your health care provider to find out if you are at risk for hepatitis B infection. Hepatitis C Testing is recommended for:  Everyone born from 1945 through 1965.  Anyone with known risk factors for hepatitis C. Sexually transmitted infections (STIs)  Get screened for STIs, including gonorrhea and chlamydia, if: ? You are sexually active and are younger than 43 years of age. ? You are older than 43 years of age and your health care provider tells you that you are at risk for this type of infection. ? Your sexual activity has changed since you were last screened, and you are at increased risk for chlamydia or gonorrhea. Ask your health care provider   if you are at risk.  Ask your health care provider about whether you are at high risk for HIV. Your health care provider may recommend a prescription medicine to help prevent HIV infection. If you choose to take medicine to prevent HIV, you should first get tested for HIV. You should then be tested every 3 months for as [place] as you are taking the medicine. Pregnancy  If you are about to stop having your period (premenopausal) and  you may become pregnant, seek counseling before you get pregnant.  Take 400 to 800 micrograms (mcg) of folic acid every day if you become pregnant.  Ask for birth control (contraception) if you want to prevent pregnancy. Osteoporosis and menopause Osteoporosis is a disease in which the bones lose minerals and strength with aging. This can result in bone fractures. If you are 65 years old or older, or if you are at risk for osteoporosis and fractures, ask your health care provider if you should:  Be screened for bone loss.  Take a calcium or vitamin D supplement to lower your risk of fractures.  Be given hormone replacement therapy (HRT) to treat symptoms of menopause. Follow these instructions at home: Lifestyle  Do not use any products that contain nicotine or tobacco, such as cigarettes, e-cigarettes, and chewing tobacco. If you need help quitting, ask your health care provider.  Do not use street drugs.  Do not share needles.  Ask your health care provider for help if you need support or information about quitting drugs. Alcohol use  Do not drink alcohol if: ? Your health care provider tells you not to drink. ? You are pregnant, may be pregnant, or are planning to become pregnant.  If you drink alcohol: ? Limit how much you use to 0-1 drink a day. ? Limit intake if you are breastfeeding.  Be aware of how much alcohol is in your drink. In the [place] one drink equals one 12 oz bottle of beer (355 mL), one 5 oz glass of wine (148 mL), or one 1 oz glass of hard liquor (44 mL). General instructions  Schedule regular health, dental, and eye exams.  Stay current with your vaccines.  Tell your health care provider if: ? You often feel depressed. ? You have ever been abused or do not feel safe at home. Summary  Adopting a healthy lifestyle and getting preventive care are important in promoting health and wellness.  Follow your health care provider's instructions about healthy  diet, exercising, and getting tested or screened for diseases.  Follow your health care provider's instructions on monitoring your cholesterol and blood pressure. This information is not intended to replace advice given to you by your health care provider. Make sure you discuss any questions you have with your health care provider. Document Revised: 10/11/2018 Document Reviewed: 10/11/2018 Elsevier Patient Education  2021 Elsevier Inc.  

## 2021-03-19 ENCOUNTER — Encounter: Payer: Self-pay | Admitting: Nurse Practitioner

## 2021-03-19 ENCOUNTER — Other Ambulatory Visit: Payer: Self-pay | Admitting: Nurse Practitioner

## 2021-03-19 DIAGNOSIS — E559 Vitamin D deficiency, unspecified: Secondary | ICD-10-CM

## 2021-03-19 LAB — CMP14+EGFR
ALT: 18 IU/L (ref 0–32)
AST: 24 IU/L (ref 0–40)
Albumin/Globulin Ratio: 1 — ABNORMAL LOW (ref 1.2–2.2)
Albumin: 4 g/dL (ref 3.8–4.8)
Alkaline Phosphatase: 70 IU/L (ref 44–121)
BUN/Creatinine Ratio: 16 (ref 9–23)
BUN: 14 mg/dL (ref 6–24)
Bilirubin Total: <0.2 mg/dL (ref 0.0–1.2)
CO2: 20 mmol/L (ref 20–29)
Calcium: 9.3 mg/dL (ref 8.7–10.2)
Chloride: 103 mmol/L (ref 96–106)
Creatinine, Ser: 0.89 mg/dL (ref 0.57–1.00)
Globulin, Total: 3.9 g/dL (ref 1.5–4.5)
Glucose: 91 mg/dL (ref 65–99)
Potassium: 4.4 mmol/L (ref 3.5–5.2)
Sodium: 138 mmol/L (ref 134–144)
Total Protein: 7.9 g/dL (ref 6.0–8.5)
eGFR: 83 mL/min/{1.73_m2} (ref >59)

## 2021-03-19 LAB — LIPID PANEL
Chol/HDL Ratio: 3.5 ratio (ref 0.0–4.4)
Cholesterol, Total: 170 mg/dL (ref 100–199)
HDL: 49 mg/dL (ref >39)
LDL Chol Calc (NIH): 105 mg/dL — ABNORMAL HIGH (ref 0–99)
Triglycerides: 87 mg/dL (ref 0–149)
VLDL Cholesterol Cal: 16 mg/dL (ref 5–40)

## 2021-03-19 LAB — CBC
Hematocrit: 37.7 % (ref 34.0–46.6)
Hemoglobin: 12.4 g/dL (ref 11.1–15.9)
MCH: 28.1 pg (ref 26.6–33.0)
MCHC: 32.9 g/dL (ref 31.5–35.7)
MCV: 85 fL (ref 79–97)
Platelets: 296 10*3/uL (ref 150–450)
RBC: 4.42 x10E6/uL (ref 3.77–5.28)
RDW: 13.3 % (ref 11.7–15.4)
WBC: 8.7 10*3/uL (ref 3.4–10.8)

## 2021-03-19 LAB — VITAMIN B12: Vitamin B-12: 691 pg/mL (ref 232–1245)

## 2021-03-19 LAB — VITAMIN D 25 HYDROXY (VIT D DEFICIENCY, FRACTURES): Vit D, 25-Hydroxy: 28.5 ng/mL — ABNORMAL LOW (ref 30.0–100.0)

## 2021-03-19 LAB — TSH: TSH: 2.32 u[IU]/mL (ref 0.450–4.500)

## 2021-03-19 LAB — HEMOGLOBIN A1C
Est. average glucose Bld gHb Est-mCnc: 123 mg/dL
Hgb A1c MFr Bld: 5.9 % — ABNORMAL HIGH (ref 4.8–5.6)

## 2021-03-19 LAB — INSULIN, RANDOM: INSULIN: 13 u[IU]/mL (ref 2.6–24.9)

## 2021-03-19 MED ORDER — VITAMIN D (ERGOCALCIFEROL) 1.25 MG (50000 UNIT) PO CAPS: 50000.0000 [IU] | ORAL_CAPSULE | ORAL | 0 refills | Status: DC

## 2021-03-26 ENCOUNTER — Other Ambulatory Visit: Payer: Self-pay | Admitting: Nurse Practitioner

## 2021-03-26 DIAGNOSIS — Z8719 Personal history of other diseases of the digestive system: Secondary | ICD-10-CM

## 2021-04-02 ENCOUNTER — Ambulatory Visit
Admission: RE | Admit: 2021-04-02 | Discharge: 2021-04-02 | Disposition: A | Discharge status: Home / Self Care | Payer: BC Managed Care – PPO | Source: Ambulatory Visit | Attending: Nurse Practitioner | Admitting: Nurse Practitioner

## 2021-04-02 ENCOUNTER — Other Ambulatory Visit: Payer: Self-pay | Admitting: Nurse Practitioner

## 2021-04-02 DIAGNOSIS — K8010 Calculus of gallbladder with chronic cholecystitis without obstruction: Secondary | ICD-10-CM

## 2021-04-02 DIAGNOSIS — Z8719 Personal history of other diseases of the digestive system: Secondary | ICD-10-CM

## 2021-06-09 ENCOUNTER — Ambulatory Visit: Payer: Self-pay | Admitting: Surgery

## 2021-06-09 DIAGNOSIS — K8020 Calculus of gallbladder without cholecystitis without obstruction: Secondary | ICD-10-CM | POA: Diagnosis not present

## 2021-06-09 NOTE — H&P (Signed)
History of Present Illness: Miranda Shah is a 45 y.o. female who is seen today as an office consultation at the request of Dr. Christell Constant for evaluation of cholelithiasis. She was first diagnosed with cholelithiasis during her pregnancy 2 years ago. Since then she has intermittently had episodes of postprandial abdominal pain, about 3-4 episodes total. The pain is in the epigastric area and radiates to both flanks and the back. It starts a few minutes after eating and then goes away after 15-20 minutes. She has not had fevers, nausea or vomiting. She recently had LFTs on 5/18 that were normal. RUQ [place] on 04/02/21 showed cholelithiasis without evidence of cholecystitis.   She says she has had reflux in the past but these symptoms feel different. Her only prior abdominal surgery is a C section in 2019.     Review of Systems: A complete review of systems was obtained from the patient.  I have reviewed this information and discussed as appropriate with the patient.  See HPI as well for other ROS.       Medical History: Past Medical History Past Medical History: Diagnosis Date  Anemia    Anxiety    GERD (gastroesophageal reflux disease)    Gestational hypertension        Patient Active Problem List Diagnosis  Class 2 obesity due to excess calories without serious comorbidity with body mass index (BMI) of 37.0 to 37.9 in adult  Irritant dermatitis  Maternal age 15+, multigravida, antepartum  Postpartum anemia  Postpartum hypertension  Preeclampsia, severe, third trimester  Rectal bleeding  Seborrheic dermatitis of scalp  Seborrheic keratosis     Past Surgical History Past Surgical History: Procedure Laterality Date  CESAREAN SECTION   09/08/2018      Allergies Allergies Allergen Reactions  Bee Pollen Itching     Itching, red eyes. sneezing Itching, red eyes. sneezing    Pecan Extract Itching     Pecan and walnuts makes Mouth itch Pecan and walnuts makes Mouth itch        No  current outpatient medications on file prior to visit.   No current facility-administered medications on file prior to visit.     Family History Family History Problem Relation Age of Onset  Hyperlipidemia (Elevated cholesterol) Mother    High blood pressure (Hypertension) Mother    Diabetes Mother    High blood pressure (Hypertension) Father    Stroke Sister    Obesity Sister        Social History   Tobacco Use Smoking Status Former Smoker  Types: Cigarettes  Quit date: 2014  Years since quitting: 8.6 Smokeless Tobacco Never Used     Social History Social History    Socioeconomic History  Marital status: Married Tobacco Use  Smoking status: Former Smoker     Types: Cigarettes     Quit date: 2014     Years since quitting: 8.6  Smokeless tobacco: Never Used Advertising account planner Use: Never used Substance and Sexual Activity  Alcohol use: Yes  Drug use: Never  Sexual activity: Yes      Objective:     Vitals:   06/09/21 0938 BP: (!) 130/90 Pulse: 96 Temp: 36.7 C (98.1 F) SpO2: 100% Weight: 96.1 kg (211 lb 12.8 oz) Height: 160 cm (5\' 3" )   Body mass index is 37.52 kg/m.   Physical Exam Vitals reviewed.  Constitutional:      General: She is not in acute distress.    Appearance: Normal appearance.  HENT:  Head: Normocephalic and atraumatic.  Eyes:     General: No scleral icterus.    Conjunctiva/sclera: Conjunctivae normal.  Cardiovascular:     Rate and Rhythm: Normal rate and regular rhythm.     Heart sounds: No murmur heard. Pulmonary:     Effort: Pulmonary effort is normal. No respiratory distress.     Breath sounds: Normal breath sounds.  Abdominal:     General: There is no distension.     Palpations: Abdomen is soft.     Tenderness: There is no abdominal tenderness.  Musculoskeletal:        General: No swelling or deformity. Normal range of motion.     Cervical back: Normal range of motion.  Skin:    General: Skin is warm and dry.      Coloration: Skin is not jaundiced.  Neurological:     General: No focal deficit present.     Mental Status: She is alert and oriented to person, place, and time.  Psychiatric:        Mood and Affect: Mood normal.        Behavior: Behavior normal.        Thought Content: Thought content normal.          Labs, Imaging and Diagnostic Testing: RUQ [place] 04/02/21: CLINICAL DATA:  Cholelithiasis   Right upper quadrant pain   EXAM: ULTRASOUND ABDOMEN LIMITED RIGHT UPPER QUADRANT   COMPARISON:  08/18/2018   FINDINGS: Gallbladder:   Cholelithiasis. Minimal adenomyomatosis noted in the gallbladder wall. No gallbladder wall thickening or pericholecystic fluid. Sonographic Eulah Pont sign is negative per technologist.   Common bile duct:   Diameter: 3 mm   Liver:   No focal lesion identified. Within normal limits in parenchymal echogenicity. Portal vein is patent on color Doppler imaging with normal direction of blood flow towards the liver.   Other: None.   IMPRESSION: Cholelithiasis without additional sonographic evidence of acute cholecystitis.   Assessment and Plan: Diagnoses and all orders for this visit:   Calculus of gallbladder without cholecystitis without obstruction       45 yo female with symptomatic cholelithiasis. Her symptoms of postprandial epigastric pain are most consistent with biliary colic. I reviewed her [place], which shows shadowing gallstones up to 1cm in diameter. There is no pericholecystic fluid or gallbladder wall thickening. Given her symptoms I recommended laparoscopic cholecystectomy. The details of this procedure were discussed with the patient, including the risks of bleeding, infection, bile leak, and <0.5% risk of common bile duct injury. The patient expressed understanding and agrees to proceed with surgery. She will be contacted to schedule a date for elective surgery.  Sophronia Simas, MD [facility] General, Hepatobiliary and  Pancreatic Surgery 06/09/21 10:37 AM

## 2021-09-22 ENCOUNTER — Encounter: Payer: Self-pay | Admitting: Nurse Practitioner

## 2021-09-22 ENCOUNTER — Ambulatory Visit (INDEPENDENT_AMBULATORY_CARE_PROVIDER_SITE_OTHER): Payer: BC Managed Care – PPO | Admitting: Nurse Practitioner

## 2021-09-22 ENCOUNTER — Other Ambulatory Visit: Payer: Self-pay

## 2021-09-22 VITALS — BP 110/78 | HR 86 | Temp 98.6°F | Ht 62.2 in | Wt 207.4 lb

## 2021-09-22 DIAGNOSIS — Z23 Encounter for immunization: Secondary | ICD-10-CM | POA: Diagnosis not present

## 2021-09-22 DIAGNOSIS — R7303 Prediabetes: Secondary | ICD-10-CM | POA: Diagnosis not present

## 2021-09-22 DIAGNOSIS — E559 Vitamin D deficiency, unspecified: Secondary | ICD-10-CM | POA: Diagnosis not present

## 2021-09-22 DIAGNOSIS — Z2821 Immunization not carried out because of patient refusal: Secondary | ICD-10-CM

## 2021-09-22 MED ORDER — VITAMIN D (ERGOCALCIFEROL) 1.25 MG (50000 UNIT) PO CAPS: 50000.0000 [IU] | ORAL_CAPSULE | ORAL | 1 refills | Status: DC

## 2021-09-22 NOTE — Patient Instructions (Signed)

## 2021-09-22 NOTE — Progress Notes (Signed)
I,Katawbba Wiggins,acting as a Neurosurgeon for [employer], FNP.,have documented all relevant documentation on the behalf of Arnette Felts, FNP,as directed by  Arnette Felts, FNP while in the presence of Arnette Felts, FNP.   This visit occurred during the SARS-CoV-2 public health emergency.  Safety protocols were in place, including screening questions prior to the visit, additional usage of staff PPE, and extensive cleaning of exam room while observing appropriate contact time as indicated for disinfecting solutions.  Subjective:     Patient ID: Miranda Shah , female    DOB: 1976/04/22 , 45 y.o.   MRN: [id number]   Chief Complaint  Patient presents with   Prediabetes    HPI  Here today for prediabetes f/u.  She has had her consultation for her gallbladder removal, plans to have done at the beginning of the year when she gets new insurance. She has quit drinking coffee noticed GI     Past Medical History:  Diagnosis Date   Anemia    Elevated liver enzymes 08/17/2018   Gestational hypertension    Seborrheic dermatitis      Family History  Problem Relation Age of Onset   Diabetes Mother    Hypertension Mother    Hypertension Father    Hyperlipidemia Maternal Grandmother      Current Outpatient Medications:    Multiple Vitamin (MULTIVITAMIN) capsule, Take 1 capsule by mouth daily., Disp: , Rfl:    Vitamin D, Ergocalciferol, (DRISDOL) 1.25 MG (50000 UNIT) CAPS capsule, Take 1 capsule (50,000 Units total) by mouth every 7 (seven) days., Disp: 12 capsule, Rfl: 1   Allergies  Allergen Reactions   Pecan Extract Allergy Skin Test Itching    Pecan and walnuts makes Mouth itch   Pollen Extract Itching    Itching, red eyes. sneezing     Review of Systems  Constitutional: Negative.   Respiratory: Negative.    Cardiovascular: Negative.   Neurological:  Negative for dizziness and headaches.  Psychiatric/Behavioral: Negative.      Today's Vitals   09/22/21 0952  BP: 110/78   Pulse: 86  Temp: 98.6 F (37 C)  Weight: 207 lb 6.4 oz (94.1 kg)  Height: 5' 2.2" (1.58 m)   Body mass index is 37.69 kg/m.  Wt Readings from Last 3 Encounters:  09/22/21 207 lb 6.4 oz (94.1 kg)  03/18/21 211 lb 9.6 oz (96 kg)  08/21/20 213 lb (96.6 kg)    BP Readings from Last 3 Encounters:  09/22/21 110/78  03/18/21 118/74  08/21/20 125/85    Objective:  Physical Exam Vitals reviewed.  Constitutional:      General: She is not in acute distress.    Appearance: Normal appearance. She is well-developed. She is obese.  HENT:     Head: Normocephalic and atraumatic.  Eyes:     Pupils: Pupils are equal, round, and reactive to light.  Cardiovascular:     Rate and Rhythm: Normal rate and regular rhythm.     Pulses: Normal pulses.     Heart sounds: Normal heart sounds. No murmur heard. Pulmonary:     Effort: Pulmonary effort is normal. No respiratory distress.     Breath sounds: Normal breath sounds. No wheezing.  Musculoskeletal:        General: Normal range of motion.  Skin:    General: Skin is warm and dry.     Capillary Refill: Capillary refill takes less than 2 seconds.  Neurological:     General: No focal deficit present.  Mental Status: She is alert and oriented to person, place, and time.     Cranial Nerves: No cranial nerve deficit.     Motor: No weakness.  Psychiatric:        Mood and Affect: Mood normal.        Behavior: Behavior normal.        Thought Content: Thought content normal.        Judgment: Judgment normal.        Assessment And Plan:     1. Prediabetes No current medications, stable Encouraged to continue to limit intake of sugary foods and drinks - Hemoglobin A1c  2. Vitamin D deficiency Will check vitamin D level and supplement as needed.    Also encouraged to spend 15 minutes in the sun daily.  - VITAMIN D 25 Hydroxy (Vit-D Deficiency, Fractures) - Vitamin D, Ergocalciferol, (DRISDOL) 1.25 MG (50000 UNIT) CAPS capsule; Take 1  capsule (50,000 Units total) by mouth every 7 (seven) days.  Dispense: 12 capsule; Refill: 1  3. Encounter for immunization Will give tetanus vaccine today while in office. Refer to order management. TDAP will be administered to adults 71-27 years old every 10 years. - Tdap vaccine greater than or equal to 7yo IM  4. Pneumococcal vaccination declined    Patient was given opportunity to ask questions. Patient verbalized understanding of the plan and was able to repeat key elements of the plan. All questions were answered to their satisfaction.  Arnette Felts, FNP   I, Arnette Felts, FNP, have reviewed all documentation for this visit. The documentation on 09/23/21 for the exam, diagnosis, procedures, and orders are all accurate and complete.   IF YOU HAVE BEEN REFERRED TO A SPECIALIST, IT MAY TAKE 1-2 WEEKS TO SCHEDULE/PROCESS THE REFERRAL. IF YOU HAVE NOT HEARD FROM US/SPECIALIST IN TWO WEEKS, PLEASE GIVE [place] A CALL AT [phone].   THE PATIENT IS ENCOURAGED TO PRACTICE SOCIAL DISTANCING DUE TO THE COVID-19 PANDEMIC.

## 2021-09-23 LAB — VITAMIN D 25 HYDROXY (VIT D DEFICIENCY, FRACTURES): Vit D, 25-Hydroxy: 31.6 ng/mL (ref 30.0–100.0)

## 2021-09-23 LAB — HEMOGLOBIN A1C
Est. average glucose Bld gHb Est-mCnc: 123 mg/dL
Hgb A1c MFr Bld: 5.9 % — ABNORMAL HIGH (ref 4.8–5.6)

## 2021-10-01 ENCOUNTER — Emergency Department (HOSPITAL_BASED_OUTPATIENT_CLINIC_OR_DEPARTMENT_OTHER)
Admission: EM | Admit: 2021-10-01 | Discharge: 2021-10-01 | Disposition: A | Discharge status: Home / Self Care | Payer: BC Managed Care – PPO | Attending: Emergency Medicine | Admitting: Emergency Medicine

## 2021-10-01 ENCOUNTER — Encounter (HOSPITAL_BASED_OUTPATIENT_CLINIC_OR_DEPARTMENT_OTHER): Payer: Self-pay | Admitting: *Deleted

## 2021-10-01 ENCOUNTER — Other Ambulatory Visit: Payer: Self-pay

## 2021-10-01 DIAGNOSIS — J111 Influenza due to unidentified influenza virus with other respiratory manifestations: Secondary | ICD-10-CM | POA: Diagnosis not present

## 2021-10-01 DIAGNOSIS — Z87891 Personal history of nicotine dependence: Secondary | ICD-10-CM | POA: Diagnosis not present

## 2021-10-01 DIAGNOSIS — R6889 Other general symptoms and signs: Secondary | ICD-10-CM

## 2021-10-01 DIAGNOSIS — R059 Cough, unspecified: Secondary | ICD-10-CM | POA: Insufficient documentation

## 2021-10-01 DIAGNOSIS — Z20822 Contact with and (suspected) exposure to covid-19: Secondary | ICD-10-CM | POA: Insufficient documentation

## 2021-10-01 LAB — RESP PANEL BY RT-PCR (FLU A&B, COVID) ARPGX2
Influenza A by PCR: NEGATIVE
Influenza B by PCR: NEGATIVE
SARS Coronavirus 2 by RT PCR: NEGATIVE

## 2021-10-01 NOTE — ED Notes (Signed)
Pt LWBS. Reports she has to pick up her son

## 2021-10-01 NOTE — ED Provider Notes (Addendum)
[facility] EMERGENCY DEPARTMENT Provider Note   CSN: [id number] Arrival date & time: 10/01/21  1555     History Chief Complaint  Patient presents with   Cough    Miranda Shah is a 45 y.o. female.  Patient presenting with flulike symptoms.  The had her flu and COVID testing swab done.  Patient states she had to pick up her kids so she left prior to being seen.  She was only in the room for short period of time before she left.      Past Medical History:  Diagnosis Date   Anemia    Elevated liver enzymes 08/17/2018   Gestational hypertension    Seborrheic dermatitis     Patient Active Problem List   Diagnosis Date Noted   Preeclampsia in postpartum period 09/14/2018   Postpartum anemia 09/09/2018   Preeclampsia, severe, third trimester 09/08/2018   Postpartum hypertension 08/16/2018   Class 2 obesity due to excess calories without serious comorbidity with body mass index (BMI) of 37.0 to 37.9 in adult 08/03/2018   Maternal age 22+, multigravida, antepartum 04/04/2018    Past Surgical History:  Procedure Laterality Date   CESAREAN SECTION N/A 09/08/2018   Procedure: CESAREAN SECTION;  Surgeon: Essie Hart, MD;  Location: [facility] BIRTHING SUITES;  Service: Obstetrics;  Laterality: N/A;     OB History     Gravida  2   Para  1   Term      Preterm  1   AB  1   Living  1      SAB      IAB  1   Ectopic      Multiple      Live Births  1           Family History  Problem Relation Age of Onset   Diabetes Mother    Hypertension Mother    Hypertension Father    Hyperlipidemia Maternal Grandmother     Social History   Tobacco Use   Smoking status: Former    Packs/day: 0.50    Types: Cigarettes    Quit date: 08/03/2013    Years since quitting: 8.1   Smokeless tobacco: Never  Vaping Use   Vaping Use: Never used  Substance Use Topics   Alcohol use: Not Currently    Comment: occasional   Drug use: Not Currently    Types: Marijuana     Comment: none since college    Home Medications Prior to Admission medications   Medication Sig Start Date End Date Taking? Authorizing Provider  Multiple Vitamin (MULTIVITAMIN) capsule Take 1 capsule by mouth daily.    [provider]  Vitamin D, Ergocalciferol, (DRISDOL) 1.25 MG (50000 UNIT) CAPS capsule Take 1 capsule (50,000 Units total) by mouth every 7 (seven) days. 09/22/21   Arnette Felts, FNP    Allergies    Pecan extract allergy skin test and Pollen extract  Review of Systems   Review of Systems  Physical Exam Updated Vital Signs BP (!) 152/86 (BP Location: Right Arm)   Pulse 99   Temp 98.2 F (36.8 C) (Oral)   Resp 16   Ht 1.6 m (5\' 3" )   Wt 95.3 kg   LMP 09/12/2021   SpO2 100%   BMI 37.20 kg/m   Physical Exam  ED Results / Procedures / Treatments   Labs (all labs ordered are listed, but only abnormal results are displayed) Labs Reviewed  RESP PANEL BY RT-PCR (FLU A&B,  COVID) ARPGX2    EKG None  Radiology No results found.  Procedures Procedures   Medications Ordered in ED Medications - No data to display  ED Course  I have reviewed the triage vital signs and the nursing notes.  Pertinent labs & imaging results that were available during my care of the patient were reviewed by me and considered in my medical decision making (see chart for details).    MDM Rules/Calculators/A&P  COVID influenza testing is back and is negative.  However patient left [place] before it was back.  Final Clinical Impression(s) / ED Diagnoses Final diagnoses:  Flu-like symptoms    Rx / DC Orders ED Discharge Orders     None        Vanetta Mulders, MD 10/01/21 1717    Vanetta Mulders, MD 10/01/21 1718

## 2021-10-01 NOTE — ED Triage Notes (Addendum)
Cough , fever, body aches x 3 days, coworkers DX with FLu , tylenol at 1pm

## 2021-10-02 ENCOUNTER — Other Ambulatory Visit: Payer: Self-pay

## 2021-10-02 ENCOUNTER — Encounter (HOSPITAL_BASED_OUTPATIENT_CLINIC_OR_DEPARTMENT_OTHER): Payer: Self-pay | Admitting: *Deleted

## 2021-10-02 ENCOUNTER — Emergency Department (HOSPITAL_BASED_OUTPATIENT_CLINIC_OR_DEPARTMENT_OTHER)
Admission: EM | Admit: 2021-10-02 | Discharge: 2021-10-03 | Disposition: A | Discharge status: Home / Self Care | Payer: BC Managed Care – PPO | Attending: Emergency Medicine | Admitting: Emergency Medicine

## 2021-10-02 DIAGNOSIS — J029 Acute pharyngitis, unspecified: Secondary | ICD-10-CM | POA: Insufficient documentation

## 2021-10-02 DIAGNOSIS — R0789 Other chest pain: Secondary | ICD-10-CM | POA: Insufficient documentation

## 2021-10-02 DIAGNOSIS — R509 Fever, unspecified: Secondary | ICD-10-CM | POA: Insufficient documentation

## 2021-10-02 DIAGNOSIS — R059 Cough, unspecified: Secondary | ICD-10-CM | POA: Insufficient documentation

## 2021-10-02 DIAGNOSIS — J069 Acute upper respiratory infection, unspecified: Secondary | ICD-10-CM

## 2021-10-02 DIAGNOSIS — Z87891 Personal history of nicotine dependence: Secondary | ICD-10-CM | POA: Diagnosis not present

## 2021-10-02 NOTE — ED Triage Notes (Signed)
Cough , fever, body aches x 4 days, coworkers DX with FLu, seen here yesterday for same , LWBS neg flu and COVID

## 2021-10-03 MED ORDER — AZITHROMYCIN 250 MG PO TABS: ORAL_TABLET | ORAL | 0 refills | Status: DC

## 2021-10-03 NOTE — Discharge Instructions (Signed)
Drink plenty of fluids and get plenty of rest.  Continue over-the-counter medications as needed for relief of symptoms.  If not improving in the next 2 to 3 days, fill the prescription for Zithromax you have been provided with this evening.

## 2021-10-03 NOTE — ED Notes (Signed)
Discharge instructions discussed with pt. Pt verbalized understanding with no questions at this time. Pt provided with printed prescription.

## 2021-10-03 NOTE — ED Provider Notes (Signed)
[facility] EMERGENCY DEPARTMENT Provider Note   CSN: [id number] Arrival date & time: 10/02/21  2052     History Chief Complaint  Patient presents with   Cough    Miranda Shah is a 45 y.o. female.  Patient is a 45 year old female with past medical history of anemia.  She presents today for evaluation of chest congestion, cough, fever, body aches, sore throat, and feeling poorly for the past 4 days.  She presented here yesterday, but left prior to completion of work-up due to having to pick up her children.  She denies to me she is having any chest pain or shortness of breath.  She has been taking Zicam with some relief.  She denies family members at home that are sick.  The history is provided by the patient.  Cough Cough characteristics:  Non-productive Severity:  Moderate Onset quality:  Gradual Duration:  4 days Timing:  Constant Progression:  Worsening Relieved by:  Nothing Worsened by:  Nothing Ineffective treatments:  None tried     Past Medical History:  Diagnosis Date   Anemia    Elevated liver enzymes 08/17/2018   Gestational hypertension    Seborrheic dermatitis     Patient Active Problem List   Diagnosis Date Noted   Preeclampsia in postpartum period 09/14/2018   Postpartum anemia 09/09/2018   Preeclampsia, severe, third trimester 09/08/2018   Postpartum hypertension 08/16/2018   Class 2 obesity due to excess calories without serious comorbidity with body mass index (BMI) of 37.0 to 37.9 in adult 08/03/2018   Maternal age 9+, multigravida, antepartum 04/04/2018    Past Surgical History:  Procedure Laterality Date   CESAREAN SECTION N/A 09/08/2018   Procedure: CESAREAN SECTION;  Surgeon: Essie Hart, MD;  Location: [facility] BIRTHING SUITES;  Service: Obstetrics;  Laterality: N/A;     OB History     Gravida  2   Para  1   Term      Preterm  1   AB  1   Living  1      SAB      IAB  1   Ectopic      Multiple      Live Births   1           Family History  Problem Relation Age of Onset   Diabetes Mother    Hypertension Mother    Hypertension Father    Hyperlipidemia Maternal Grandmother     Social History   Tobacco Use   Smoking status: Former    Packs/day: 0.50    Types: Cigarettes    Quit date: 08/03/2013    Years since quitting: 8.1   Smokeless tobacco: Never  Vaping Use   Vaping Use: Never used  Substance Use Topics   Alcohol use: Not Currently    Comment: occasional   Drug use: Not Currently    Types: Marijuana    Comment: none since college    Home Medications Prior to Admission medications   Medication Sig Start Date End Date Taking? Authorizing Provider  Multiple Vitamin (MULTIVITAMIN) capsule Take 1 capsule by mouth daily.    [provider]  Vitamin D, Ergocalciferol, (DRISDOL) 1.25 MG (50000 UNIT) CAPS capsule Take 1 capsule (50,000 Units total) by mouth every 7 (seven) days. 09/22/21   Arnette Felts, FNP    Allergies    Pecan extract allergy skin test and Pollen extract  Review of Systems   Review of Systems  Respiratory:  Positive  for cough.   All other systems reviewed and are negative.  Physical Exam Updated Vital Signs BP (!) 143/88 (BP Location: Right Arm)   Pulse 88   Temp 97.7 F (36.5 C) (Oral)   Resp 18   LMP 09/12/2021   SpO2 100%   Physical Exam Vitals and nursing note reviewed.  Constitutional:      General: She is not in acute distress.    Appearance: She is well-developed. She is not diaphoretic.  HENT:     Head: Normocephalic and atraumatic.     Mouth/Throat:     Mouth: Mucous membranes are moist.     Pharynx: No oropharyngeal exudate or posterior oropharyngeal erythema.  Cardiovascular:     Rate and Rhythm: Normal rate and regular rhythm.     Heart sounds: No murmur heard.   No friction rub. No gallop.  Pulmonary:     Effort: Pulmonary effort is normal. No respiratory distress.     Breath sounds: Normal breath sounds. No wheezing.   Abdominal:     General: Bowel sounds are normal. There is no distension.     Palpations: Abdomen is soft.     Tenderness: There is no abdominal tenderness.  Musculoskeletal:        General: Normal range of motion.     Cervical back: Normal range of motion and neck supple.  Skin:    General: Skin is warm and dry.  Neurological:     General: No focal deficit present.     Mental Status: She is alert and oriented to person, place, and time.    ED Results / Procedures / Treatments   Labs (all labs ordered are listed, but only abnormal results are displayed) Labs Reviewed - No data to display  EKG None  Radiology No results found.  Procedures Procedures   Medications Ordered in ED Medications - No data to display  ED Course  I have reviewed the triage vital signs and the nursing notes.  Pertinent labs & imaging results that were available during my care of the patient were reviewed by me and considered in my medical decision making (see chart for details).    MDM Rules/Calculators/A&P  Patient presenting with URI symptoms that I suspect are viral in nature.   Her vital signs are stable and oxygen saturations 100%.  Physical examination reveals no obvious abnormalities.  Patient appropriate for discharge with over-the-counter medications and return as needed.  She will be prescribed Zithromax which can be filled if she is not improving in the next 2 to 3 days as she reports a history of bronchitis and this feels similar.  Final Clinical Impression(s) / ED Diagnoses Final diagnoses:  None    Rx / DC Orders ED Discharge Orders     None        Geoffery Lyons, MD 10/03/21 [phone]

## 2021-10-22 ENCOUNTER — Other Ambulatory Visit: Payer: Self-pay | Admitting: Internal Medicine

## 2021-11-05 DIAGNOSIS — U071 COVID-19: Secondary | ICD-10-CM | POA: Diagnosis not present

## 2021-11-05 DIAGNOSIS — R059 Cough, unspecified: Secondary | ICD-10-CM | POA: Diagnosis not present

## 2022-01-13 ENCOUNTER — Ambulatory Visit: Payer: Self-pay | Admitting: Surgery

## 2022-01-13 NOTE — Pre-Procedure Instructions (Signed)
Surgical Instructions ? ? ? Your procedure is scheduled on Friday, March 24. ? Report to [facility] Main Entrance "A" at 6:00 A.M., then check in with the Admitting office. ? Call this number if you have problems the morning of surgery: ? [phone] ? ? If you have any questions prior to your surgery date call [phone]: Open Monday-Friday 8am-4pm ? ? ? Remember: ? Do not eat after midnight the night before your surgery ? ?You may drink clear liquids until 05:00AM the morning of your surgery.   ?Clear liquids allowed are: Water, Non-Citrus Juices (without pulp), Carbonated Beverages, Clear Tea, Black Coffee ONLY (NO MILK, CREAM OR POWDERED CREAMER of any kind), and Gatorade ?  ? Take these medicines the morning of surgery with A SIP OF WATER: NONE ? ?As of today please stop taking loratadine-pseudoephedrine (CLARITIN-D 24-HOUR) (or any medication containing pseudoephedrine) ? ?As of today, STOP taking any Aspirin (unless otherwise instructed by your surgeon) Aleve, Naproxen, Ibuprofen, Motrin, Advil, Goody's, BC's, all herbal medications, fish oil, and all vitamins. ? ?         ?Do not wear jewelry or makeup ?Do not wear lotions, powders, perfumes/colognes, or deodorant. ?Do not shave 48 hours prior to surgery.  Men may shave face and neck. ?Do not bring valuables to the hospital. ?Do not wear nail polish, gel polish, artificial nails, or any other type of covering on natural nails (fingers and toes) ?If you have artificial nails or gel coating that need to be removed by a nail salon, please have this removed prior to surgery. Artificial nails or gel coating may interfere with anesthesia's ability to adequately monitor your vital signs. ? ?[place] is not responsible for any belongings or valuables. .  ? ?Do NOT Smoke (Tobacco/Vaping)  24 hours prior to your procedure ? ?If you use a CPAP at night, you may bring your mask for your overnight stay. ?  ?Contacts, glasses, hearing aids, dentures or partials may  not be worn into surgery, please bring cases for these belongings ?  ?For patients admitted to the hospital, discharge time will be determined by your treatment team. ?  ?Patients discharged the day of surgery will not be allowed to drive home, and someone needs to stay with them for 24 hours. ? ?NO VISITORS WILL BE ALLOWED IN PRE-OP WHERE PATIENTS ARE PREPPED FOR SURGERY.  ONLY 1 SUPPORT PERSON MAY BE PRESENT IN THE WAITING ROOM WHILE YOU ARE IN SURGERY.  IF YOU ARE TO BE ADMITTED, ONCE YOU ARE IN YOUR ROOM YOU WILL BE ALLOWED TWO (2) VISITORS. 1 (ONE) VISITOR MAY STAY OVERNIGHT BUT MUST ARRIVE TO THE ROOM BY 8pm.  Minor children may have two parents present. Special consideration for safety and communication needs will be reviewed on a case by case basis. ? ?Special instructions:   ? ?Oral Hygiene is also important to reduce your risk of infection.  Remember - BRUSH YOUR TEETH THE MORNING OF SURGERY WITH YOUR REGULAR TOOTHPASTE ? ? ?- Preparing For Surgery ? ?Before surgery, you can play an important role. Because skin is not sterile, your skin needs to be as free of germs as possible. You can reduce the number of germs on your skin by washing with CHG (chlorahexidine gluconate) Soap before surgery.  CHG is an antiseptic cleaner which kills germs and bonds with the skin to continue killing germs even after washing.   ? ? ?Please do not use if you have an allergy to CHG or  antibacterial soaps. If your skin becomes reddened/irritated stop using the CHG.  ?Do not shave (including legs and underarms) for at least 48 hours prior to first CHG shower. It is OK to shave your face. ? ?Please follow these instructions carefully. ?  ? ? Shower the NIGHT BEFORE SURGERY and the MORNING OF SURGERY with CHG Soap.  ? If you chose to wash your hair, wash your hair first as usual with your normal shampoo. After you shampoo, rinse your hair and body thoroughly to remove the shampoo.  Then [employer] and genitals (private  parts) with your normal soap and rinse thoroughly to remove soap. ? ?After that Use CHG Soap as you would any other liquid soap. You can apply CHG directly to the skin and wash gently with a scrungie or a clean washcloth.  ? ?Apply the CHG Soap to your body ONLY FROM THE NECK DOWN.  Do not use on open wounds or open sores. Avoid contact with your eyes, ears, mouth and genitals (private parts). Wash Face and genitals (private parts)  with your normal soap.  ? ?Wash thoroughly, paying special attention to the area where your surgery will be performed. ? ?Thoroughly rinse your body with warm water from the neck down. ? ?DO NOT shower/wash with your normal soap after using and rinsing off the CHG Soap. ? ?Pat yourself dry with a CLEAN TOWEL. ? ?Wear CLEAN PAJAMAS to bed the night before surgery ? ?Place CLEAN SHEETS on your bed the night before your surgery ? ?DO NOT SLEEP WITH PETS. ? ? ?Day of Surgery: ? ?Take a shower with CHG soap. ?Wear Clean/Comfortable clothing the morning of surgery ?Do not apply any deodorants/lotions.   ?Remember to brush your teeth WITH YOUR REGULAR TOOTHPASTE. ? ? ? ?COVID testing ? ?If you are going to stay overnight or be admitted after your procedure/surgery and require a pre-op COVID test, please follow these instructions after your COVID test  ? ?You are not required to quarantine however you are required to wear a well-fitting mask when you are out and around people not in your household.  If your mask becomes wet or soiled, replace with a new one. ? ?Wash your hands often with soap and water for 20 seconds or clean your hands with an alcohol-based hand sanitizer that contains at least 60% alcohol. ? ?Do not share personal items. ? ?Notify your provider: ?if you are in close contact with someone who has COVID  ?or if you develop a fever of 100.4 or greater, sneezing, cough, sore throat, shortness of breath or body aches. ? ?  ?Please read over the following fact sheets that you were  given.  ? ?

## 2022-01-14 ENCOUNTER — Other Ambulatory Visit: Payer: Self-pay

## 2022-01-14 ENCOUNTER — Encounter (HOSPITAL_COMMUNITY)
Admission: RE | Admit: 2022-01-14 | Discharge: 2022-01-14 | Disposition: A | Discharge status: Home / Self Care | Payer: BC Managed Care – PPO | Source: Ambulatory Visit | Attending: Surgery | Admitting: Surgery

## 2022-01-14 ENCOUNTER — Encounter (HOSPITAL_COMMUNITY): Payer: Self-pay

## 2022-01-14 VITALS — BP 119/72 | HR 82 | Temp 98.2°F | Resp 18 | Ht 63.0 in | Wt 206.4 lb

## 2022-01-14 DIAGNOSIS — K769 Liver disease, unspecified: Secondary | ICD-10-CM | POA: Insufficient documentation

## 2022-01-14 DIAGNOSIS — Z01812 Encounter for preprocedural laboratory examination: Secondary | ICD-10-CM | POA: Diagnosis not present

## 2022-01-14 LAB — COMPREHENSIVE METABOLIC PANEL
ALT: 27 U/L (ref 0–44)
AST: 24 U/L (ref 15–41)
Albumin: 3.6 g/dL (ref 3.5–5.0)
Alkaline Phosphatase: 51 U/L (ref 38–126)
Anion gap: 10 (ref 5–15)
BUN: 11 mg/dL (ref 6–20)
CO2: 24 mmol/L (ref 22–32)
Calcium: 9 mg/dL (ref 8.9–10.3)
Chloride: 102 mmol/L (ref 98–111)
Creatinine, Ser: 0.84 mg/dL (ref 0.44–1.00)
GFR, Estimated: >60 mL/min (ref >60)
Glucose, Bld: 107 mg/dL — ABNORMAL HIGH (ref 70–99)
Potassium: 3.7 mmol/L (ref 3.5–5.1)
Sodium: 136 mmol/L (ref 135–145)
Total Bilirubin: 0.5 mg/dL (ref 0.3–1.2)
Total Protein: 7.9 g/dL (ref 6.5–8.1)

## 2022-01-14 LAB — CBC
HCT: 35.2 % — ABNORMAL LOW (ref 36.0–46.0)
Hemoglobin: 11.3 g/dL — ABNORMAL LOW (ref 12.0–15.0)
MCH: 27.9 pg (ref 26.0–34.0)
MCHC: 32.1 g/dL (ref 30.0–36.0)
MCV: 86.9 fL (ref 80.0–100.0)
Platelets: 284 10*3/uL (ref 150–400)
RBC: 4.05 MIL/uL (ref 3.87–5.11)
RDW: 13.2 % (ref 11.5–15.5)
WBC: 9.1 10*3/uL (ref 4.0–10.5)
nRBC: 0 % (ref 0.0–0.2)

## 2022-01-14 NOTE — Pre-Procedure Instructions (Addendum)
Surgical Instructions ? ? ? Your procedure is scheduled on Friday, March 24th. ? Report to [facility] Main Entrance "A" at 6:00 A.M., then check in with the Admitting office. ? Call this number if you have problems the morning of surgery: ? [phone] ? ? If you have any questions prior to your surgery date call [phone]: Open Monday-Friday 8am-4pm ? ? ? Remember: ? Do not eat or drink after midnight the night before your surgery ? ? ? Take these medicines the morning of surgery with A SIP OF WATER: NONE ? ?As of today please stop taking loratadine-pseudoephedrine (CLARITIN-D 24-HOUR) (or any medication containing pseudoephedrine) ? ?7 days prior to surgery, STOP taking any Aspirin (unless otherwise instructed by your surgeon) Aleve, Naproxen, Ibuprofen, Motrin, Advil, Goody's, BC's, all herbal medications, fish oil, and all vitamins. ? ?         ?Do not wear jewelry or makeup ?Do not wear lotions, powders, perfumes, or deodorant. ?Do not shave 48 hours prior to surgery.   ?Do not bring valuables to the hospital. ?Do not wear nail polish, gel polish, artificial nails, or any other type of covering on natural nails (fingers and toes) ?If you have artificial nails or gel coating that need to be removed by a nail salon, please have this removed prior to surgery. Artificial nails or gel coating may interfere with anesthesia's ability to adequately monitor your vital signs. ? ?[facility] is not responsible for any belongings or valuables. .  ? ?Do NOT Smoke (Tobacco/Vaping)  24 hours prior to your procedure ? ?If you use a CPAP at night, you may bring your mask for your overnight stay. ?  ?Contacts, glasses, hearing aids, dentures or partials may not be worn into surgery, please bring cases for these belongings ?  ?For patients admitted to the hospital, discharge time will be determined by your treatment team. ?  ?Patients discharged the day of surgery will not be allowed to drive home, and someone needs to stay  with them for 24 hours. ? ?NO VISITORS WILL BE ALLOWED IN PRE-OP WHERE PATIENTS ARE PREPPED FOR SURGERY.  ONLY 1 SUPPORT PERSON MAY BE PRESENT IN THE WAITING ROOM WHILE YOU ARE IN SURGERY.  IF YOU ARE TO BE ADMITTED, ONCE YOU ARE IN YOUR ROOM YOU WILL BE ALLOWED TWO (2) VISITORS. 1 (ONE) VISITOR MAY STAY OVERNIGHT BUT MUST ARRIVE TO THE ROOM BY 8pm.  Minor children may have two parents present. Special consideration for safety and communication needs will be reviewed on a case by case basis. ? ?Special instructions:   ? ?Oral Hygiene is also important to reduce your risk of infection.  Remember - BRUSH YOUR TEETH THE MORNING OF SURGERY WITH YOUR REGULAR TOOTHPASTE ? ? ?[place] Preparing For Surgery ? ?Before surgery, you can play an important role. Because skin is not sterile, your skin needs to be as free of germs as possible. You can reduce the number of germs on your skin by washing with CHG (chlorahexidine gluconate) Soap before surgery.  CHG is an antiseptic cleaner which kills germs and bonds with the skin to continue killing germs even after washing.   ? ? ?Please do not use if you have an allergy to CHG or antibacterial soaps. If your skin becomes reddened/irritated stop using the CHG.  ?Do not shave (including legs and underarms) for at least 48 hours prior to first CHG shower. It is OK to shave your face. ? ?Please follow these instructions carefully. ?  ? ?  Shower the NIGHT BEFORE SURGERY and the MORNING OF SURGERY with CHG Soap.  ? If you chose to wash your hair, wash your hair first as usual with your normal shampoo. After you shampoo, rinse your hair and body thoroughly to remove the shampoo.  Then [employer] and genitals (private parts) with your normal soap and rinse thoroughly to remove soap. ? ?After that Use CHG Soap as you would any other liquid soap. You can apply CHG directly to the skin and wash gently with a scrungie or a clean washcloth.  ? ?Apply the CHG Soap to your body ONLY FROM THE  NECK DOWN.  Do not use on open wounds or open sores. Avoid contact with your eyes, ears, mouth and genitals (private parts). Wash Face and genitals (private parts)  with your normal soap.  ? ?Wash thoroughly, paying special attention to the area where your surgery will be performed. ? ?Thoroughly rinse your body with warm water from the neck down. ? ?DO NOT shower/wash with your normal soap after using and rinsing off the CHG Soap. ? ?Pat yourself dry with a CLEAN TOWEL. ? ?Wear CLEAN PAJAMAS to bed the night before surgery ? ?Place CLEAN SHEETS on your bed the night before your surgery ? ?DO NOT SLEEP WITH PETS. ? ? ?Day of Surgery: ? ?Take a shower with CHG soap. ?Wear Clean/Comfortable clothing the morning of surgery ?Do not apply any deodorants/lotions.   ?Remember to brush your teeth WITH YOUR REGULAR TOOTHPASTE. ? ?Notify your provider: ?if you are in close contact with someone who has COVID  ?or if you develop a fever of 100.4 or greater, sneezing, cough, sore throat, shortness of breath or body aches. ? ?  ?Please read over the following fact sheets that you were given.  ? ?

## 2022-01-14 NOTE — Progress Notes (Signed)
PCP - Arnette Felts, FNP ?Cardiologist - Dr. Jacinto Halim, x 1 visit after issues with gestational diabetes.  ? ?PPM/ICD - n/a ? ?Chest x-ray - n/a ?EKG - n/a ?Stress Test - denies ?ECHO - 09/02/20 ?Cardiac Cath - denies ? ?Sleep Study - denies ?CPAP - denies ? ?Blood Thinner Instructions: n/a ?Aspirin Instructions: n/a ? ?NPO at MD  ? ?COVID TEST- not indicated.  ? ?Anesthesia review: No ? ?Patient denies shortness of breath, fever, cough and chest pain at PAT appointment ? ? ?All instructions explained to the patient, with a verbal understanding of the material. Patient agrees to go over the instructions while at home for a better understanding. Patient also instructed to self quarantine after being tested for COVID-19. The opportunity to ask questions was provided. ? ? ?

## 2022-01-21 NOTE — Progress Notes (Signed)
Pt made aware of surgery time change 0730-0830, arrival 0530. ?

## 2022-01-22 ENCOUNTER — Other Ambulatory Visit: Payer: Self-pay

## 2022-01-22 ENCOUNTER — Encounter (HOSPITAL_COMMUNITY): Payer: Self-pay | Admitting: Surgery

## 2022-01-22 ENCOUNTER — Ambulatory Visit (HOSPITAL_COMMUNITY): Payer: BC Managed Care – PPO | Admitting: Certified Registered"

## 2022-01-22 ENCOUNTER — Ambulatory Visit (HOSPITAL_COMMUNITY)
Admission: RE | Admit: 2022-01-22 | Discharge: 2022-01-22 | Disposition: A | Discharge status: Home / Self Care | Payer: BC Managed Care – PPO | Source: Ambulatory Visit | Attending: Surgery | Admitting: Surgery

## 2022-01-22 ENCOUNTER — Encounter (HOSPITAL_COMMUNITY)
Admission: RE | Disposition: A | Discharge status: Home / Self Care | Payer: Self-pay | Source: Ambulatory Visit | Attending: Surgery

## 2022-01-22 DIAGNOSIS — R599 Enlarged lymph nodes, unspecified: Secondary | ICD-10-CM | POA: Diagnosis not present

## 2022-01-22 DIAGNOSIS — K8010 Calculus of gallbladder with chronic cholecystitis without obstruction: Secondary | ICD-10-CM | POA: Insufficient documentation

## 2022-01-22 DIAGNOSIS — Z87891 Personal history of nicotine dependence: Secondary | ICD-10-CM | POA: Diagnosis not present

## 2022-01-22 DIAGNOSIS — K8020 Calculus of gallbladder without cholecystitis without obstruction: Secondary | ICD-10-CM | POA: Diagnosis not present

## 2022-01-22 DIAGNOSIS — K828 Other specified diseases of gallbladder: Secondary | ICD-10-CM | POA: Diagnosis not present

## 2022-01-22 DIAGNOSIS — K8012 Calculus of gallbladder with acute and chronic cholecystitis without obstruction: Secondary | ICD-10-CM | POA: Diagnosis not present

## 2022-01-22 HISTORY — PX: CHOLECYSTECTOMY: SHX55

## 2022-01-22 LAB — POCT PREGNANCY, URINE: Preg Test, Ur: NEGATIVE

## 2022-01-22 SURGERY — LAPAROSCOPIC CHOLECYSTECTOMY
Anesthesia: General

## 2022-01-22 MED ORDER — ONDANSETRON HCL 4 MG/2ML IJ SOLN
4.0000 mg | Freq: Four times a day (QID) | INTRAMUSCULAR | Status: AC | PRN
  Administered 2022-01-22: 4 mg via INTRAVENOUS

## 2022-01-22 MED ORDER — FENTANYL CITRATE (PF) 250 MCG/5ML IJ SOLN
INTRAMUSCULAR | Status: AC
  Filled 2022-01-22: qty 5

## 2022-01-22 MED ORDER — SODIUM CHLORIDE 0.9 % IR SOLN
Status: DC | PRN
  Administered 2022-01-22: 1000 mL

## 2022-01-22 MED ORDER — 0.9 % SODIUM CHLORIDE (POUR BTL) OPTIME
TOPICAL | Status: DC | PRN
  Administered 2022-01-22: 1000 mL

## 2022-01-22 MED ORDER — TRAMADOL HCL 50 MG PO TABS: 50.0000 mg | ORAL_TABLET | Freq: Four times a day (QID) | ORAL | 0 refills | Status: AC | PRN

## 2022-01-22 MED ORDER — MIDAZOLAM HCL 2 MG/2ML IJ SOLN
INTRAMUSCULAR | Status: DC | PRN
  Administered 2022-01-22: 2 mg via INTRAVENOUS

## 2022-01-22 MED ORDER — SUGAMMADEX SODIUM 200 MG/2ML IV SOLN
INTRAVENOUS | Status: DC | PRN
  Administered 2022-01-22: 200 mg via INTRAVENOUS

## 2022-01-22 MED ORDER — FENTANYL CITRATE (PF) 250 MCG/5ML IJ SOLN
INTRAMUSCULAR | Status: DC | PRN
  Administered 2022-01-22 (×2): 25 ug via INTRAVENOUS
  Administered 2022-01-22: 100 ug via INTRAVENOUS

## 2022-01-22 MED ORDER — SUCCINYLCHOLINE CHLORIDE 200 MG/10ML IV SOSY
PREFILLED_SYRINGE | INTRAVENOUS | Status: DC | PRN
Start: 2022-01-22 — End: 2022-01-22
  Administered 2022-01-22: 40 mg via INTRAVENOUS

## 2022-01-22 MED ORDER — LIDOCAINE 2% (20 MG/ML) 5 ML SYRINGE
INTRAMUSCULAR | Status: DC | PRN
  Administered 2022-01-22: 50 mg via INTRAVENOUS

## 2022-01-22 MED ORDER — CEFAZOLIN SODIUM-DEXTROSE 2-4 GM/100ML-% IV SOLN
2.0000 g | INTRAVENOUS | Status: AC
  Administered 2022-01-22: 2 g via INTRAVENOUS
  Filled 2022-01-22: qty 100

## 2022-01-22 MED ORDER — LACTATED RINGERS IV SOLN: INTRAVENOUS | Status: DC

## 2022-01-22 MED ORDER — ORAL CARE MOUTH RINSE: 15.0000 mL | Freq: Once | OROMUCOSAL | Status: AC

## 2022-01-22 MED ORDER — ONDANSETRON HCL 4 MG/2ML IJ SOLN
INTRAMUSCULAR | Status: DC | PRN
  Administered 2022-01-22: 4 mg via INTRAVENOUS

## 2022-01-22 MED ORDER — ROCURONIUM BROMIDE 10 MG/ML (PF) SYRINGE
PREFILLED_SYRINGE | INTRAVENOUS | Status: DC | PRN
Start: 2022-01-22 — End: 2022-01-22
  Administered 2022-01-22: 60 mg via INTRAVENOUS

## 2022-01-22 MED ORDER — DEXAMETHASONE SODIUM PHOSPHATE 10 MG/ML IJ SOLN
INTRAMUSCULAR | Status: DC | PRN
  Administered 2022-01-22: 10 mg via INTRAVENOUS

## 2022-01-22 MED ORDER — PROPOFOL 10 MG/ML IV BOLUS
INTRAVENOUS | Status: AC
  Filled 2022-01-22: qty 20

## 2022-01-22 MED ORDER — CHLORHEXIDINE GLUCONATE 0.12 % MT SOLN
15.0000 mL | Freq: Once | OROMUCOSAL | Status: AC
  Administered 2022-01-22: 15 mL via OROMUCOSAL
  Filled 2022-01-22: qty 15

## 2022-01-22 MED ORDER — GABAPENTIN 300 MG PO CAPS
300.0000 mg | ORAL_CAPSULE | ORAL | Status: AC
  Administered 2022-01-22: 300 mg via ORAL
  Filled 2022-01-22: qty 1

## 2022-01-22 MED ORDER — BUPIVACAINE-EPINEPHRINE (PF) 0.25% -1:200000 IJ SOLN
INTRAMUSCULAR | Status: AC
  Filled 2022-01-22: qty 30

## 2022-01-22 MED ORDER — BUPIVACAINE-EPINEPHRINE 0.25% -1:200000 IJ SOLN
INTRAMUSCULAR | Status: DC | PRN
  Administered 2022-01-22: 16 mL

## 2022-01-22 MED ORDER — FENTANYL CITRATE (PF) 100 MCG/2ML IJ SOLN
INTRAMUSCULAR | Status: AC
  Filled 2022-01-22: qty 2

## 2022-01-22 MED ORDER — PROPOFOL 10 MG/ML IV BOLUS
INTRAVENOUS | Status: DC | PRN
Start: 2022-01-22 — End: 2022-01-22
  Administered 2022-01-22: 50 mg via INTRAVENOUS
  Administered 2022-01-22: 30 mg via INTRAVENOUS
  Administered 2022-01-22: 170 mg via INTRAVENOUS

## 2022-01-22 MED ORDER — PHENYLEPHRINE 40 MCG/ML (10ML) SYRINGE FOR IV PUSH (FOR BLOOD PRESSURE SUPPORT)
PREFILLED_SYRINGE | INTRAVENOUS | Status: DC | PRN
  Administered 2022-01-22: 40 ug via INTRAVENOUS

## 2022-01-22 MED ORDER — OXYCODONE HCL 5 MG PO TABS: 5.0000 mg | ORAL_TABLET | Freq: Once | ORAL | Status: DC | PRN

## 2022-01-22 MED ORDER — FENTANYL CITRATE (PF) 100 MCG/2ML IJ SOLN
25.0000 ug | INTRAMUSCULAR | Status: DC | PRN
  Administered 2022-01-22 (×4): 25 ug via INTRAVENOUS

## 2022-01-22 MED ORDER — MIDAZOLAM HCL 2 MG/2ML IJ SOLN
INTRAMUSCULAR | Status: AC
  Filled 2022-01-22: qty 2

## 2022-01-22 MED ORDER — ONDANSETRON HCL 4 MG/2ML IJ SOLN
INTRAMUSCULAR | Status: AC
  Filled 2022-01-22: qty 2

## 2022-01-22 MED ORDER — OXYCODONE HCL 5 MG/5ML PO SOLN: 5.0000 mg | Freq: Once | ORAL | Status: DC | PRN

## 2022-01-22 MED ORDER — ACETAMINOPHEN 500 MG PO TABS
1000.0000 mg | ORAL_TABLET | ORAL | Status: AC
  Administered 2022-01-22: 1000 mg via ORAL
  Filled 2022-01-22: qty 2

## 2022-01-22 SURGICAL SUPPLY — 43 items
ADH SKN CLS APL DERMABOND .7 (GAUZE/BANDAGES/DRESSINGS) ×1
APPLIER CLIP 5 13 M/L LIGAMAX5 (MISCELLANEOUS) ×2
APR CLP MED LRG 5 ANG JAW (MISCELLANEOUS) ×1
BAG COUNTER SPONGE SURGICOUNT (BAG) ×2 IMPLANT
BAG SPNG CNTER NS LX DISP (BAG) ×1
BLADE CLIPPER SURG (BLADE) IMPLANT
CANISTER SUCT 3000ML PPV (MISCELLANEOUS) ×2 IMPLANT
CHLORAPREP W/TINT 26 (MISCELLANEOUS) ×2 IMPLANT
CLIP APPLIE 5 13 M/L LIGAMAX5 (MISCELLANEOUS) ×1 IMPLANT
COVER SURGICAL LIGHT HANDLE (MISCELLANEOUS) ×2 IMPLANT
DERMABOND ADVANCED (GAUZE/BANDAGES/DRESSINGS) ×1
DERMABOND ADVANCED .7 DNX12 (GAUZE/BANDAGES/DRESSINGS) ×1 IMPLANT
ELECT REM PT RETURN 9FT ADLT (ELECTROSURGICAL) ×2
ELECTRODE REM PT RTRN 9FT ADLT (ELECTROSURGICAL) ×1 IMPLANT
GLOVE SURG POLY MICRO LF SZ5.5 (GLOVE) ×2 IMPLANT
GLOVE SURG UNDER POLY LF SZ6 (GLOVE) ×2 IMPLANT
GOWN STRL REUS W/ TWL LRG LVL3 (GOWN DISPOSABLE) ×3 IMPLANT
GOWN STRL REUS W/TWL LRG LVL3 (GOWN DISPOSABLE) ×6
KIT BASIN OR (CUSTOM PROCEDURE TRAY) ×2 IMPLANT
KIT TURNOVER KIT B (KITS) ×2 IMPLANT
L-HOOK LAP DISP 36CM (ELECTROSURGICAL) ×2
LHOOK LAP DISP 36CM (ELECTROSURGICAL) ×1 IMPLANT
NDL INSUFFLATION 14GA 120MM (NEEDLE) IMPLANT
NEEDLE INSUFFLATION 14GA 120MM (NEEDLE) IMPLANT
NS IRRIG 1000ML POUR BTL (IV SOLUTION) ×2 IMPLANT
PAD ARMBOARD 7.5X6 YLW CONV (MISCELLANEOUS) ×2 IMPLANT
PENCIL BUTTON HOLSTER BLD 10FT (ELECTRODE) ×2 IMPLANT
POUCH SPECIMEN RETRIEVAL 10MM (ENDOMECHANICALS) ×2 IMPLANT
SCISSORS LAP 5X35 DISP (ENDOMECHANICALS) ×2 IMPLANT
SET IRRIG TUBING LAPAROSCOPIC (IRRIGATION / IRRIGATOR) ×2 IMPLANT
SET TUBE SMOKE EVAC HIGH FLOW (TUBING) ×2 IMPLANT
SLEEVE ENDOPATH XCEL 5M (ENDOMECHANICALS) ×4 IMPLANT
SUT MNCRL AB 4-0 PS2 18 (SUTURE) ×2 IMPLANT
SUT VIC AB 3-0 SH 27 (SUTURE)
SUT VIC AB 3-0 SH 27XBRD (SUTURE) IMPLANT
TOWEL GREEN STERILE (TOWEL DISPOSABLE) ×2 IMPLANT
TOWEL GREEN STERILE FF (TOWEL DISPOSABLE) ×2 IMPLANT
TRAY LAPAROSCOPIC MC (CUSTOM PROCEDURE TRAY) ×2 IMPLANT
TROCAR XCEL 12X100 BLDLESS (ENDOMECHANICALS) IMPLANT
TROCAR XCEL BLUNT TIP 100MML (ENDOMECHANICALS) ×2 IMPLANT
TROCAR XCEL NON-BLD 5MMX100MML (ENDOMECHANICALS) ×2 IMPLANT
WARMER LAPAROSCOPE (MISCELLANEOUS) ×2 IMPLANT
WATER STERILE IRR 1000ML POUR (IV SOLUTION) ×2 IMPLANT

## 2022-01-22 NOTE — Transfer of Care (Signed)
Immediate Anesthesia Transfer of Care Note ? ?Patient: Miranda Shah ? ?Procedure(s) Performed: LAPAROSCOPIC CHOLECYSTECTOMY ? ?Patient Location: PACU ? ?Anesthesia Type:General ? ?Level of Consciousness: awake, alert  and oriented ? ?Airway & Oxygen Therapy: Patient Spontanous Breathing ? ?Post-op Assessment: Report given to RN and Post -op Vital signs reviewed and stable ? ?Post vital signs: Reviewed and stable ? ?Last Vitals:  ?Vitals Value Taken Time  ?BP 117/65 01/22/22 0904  ?Temp 36.4 ?C 01/22/22 0905  ?Pulse 82 01/22/22 0910  ?Resp 18 01/22/22 0910  ?SpO2 96 % 01/22/22 0910  ?Vitals shown include unvalidated device data. ? ?Last Pain:  ?Vitals:  ? 01/22/22 0905  ?TempSrc:   ?PainSc: Asleep  ?   ? ?  ? ?Complications: No notable events documented. ?

## 2022-01-22 NOTE — Anesthesia Preprocedure Evaluation (Signed)
Anesthesia Evaluation  ?Patient identified by MRN, date of birth, ID band ?Patient awake ? ? ? ?Reviewed: ?Allergy & Precautions, H&P , NPO status , Patient's Chart, lab work & pertinent test results ? ?Airway ?Mallampati: II ? ? ?Neck ROM: full ? ? ? Dental ?  ?Pulmonary ?former smoker,  ?  ?breath sounds clear to auscultation ? ? ? ? ? ? Cardiovascular ?hypertension,  ?Rhythm:regular Rate:Normal ? ? ?  ?Neuro/Psych ?  ? GI/Hepatic ?  ?Endo/Other  ?obese ? Renal/GU ?  ? ?  ?Musculoskeletal ? ? Abdominal ?  ?Peds ? Hematology ?  ?Anesthesia Other Findings ? ? Reproductive/Obstetrics ? ?  ? ? ? ? ? ? ? ? ? ? ? ? ? ?  ?  ? ? ? ? ? ? ? ? ?Anesthesia Physical ?Anesthesia Plan ? ?ASA: 2 ? ?Anesthesia Plan: General  ? ?Post-op Pain Management:   ? ?Induction: Intravenous ? ?PONV Risk Score and Plan: 3 and Ondansetron, Dexamethasone, Midazolam and Treatment may vary due to age or medical condition ? ?Airway Management Planned: Oral ETT ? ?Additional Equipment:  ? ?Intra-op Plan:  ? ?Post-operative Plan: Extubation in OR ? ?Informed Consent: I have reviewed the patients History and Physical, chart, labs and discussed the procedure including the risks, benefits and alternatives for the proposed anesthesia with the patient or authorized representative who has indicated his/her understanding and acceptance.  ? ? ? ?Dental advisory given ? ?Plan Discussed with: CRNA, Anesthesiologist and Surgeon ? ?Anesthesia Plan Comments:   ? ? ? ? ? ? ?Anesthesia Quick Evaluation ? ?

## 2022-01-22 NOTE — Discharge Instructions (Addendum)
[facility] DISCHARGE INSTRUCTIONS ? ?Activity ?No heavy lifting greater than 15 pounds for 4 weeks after surgery. ?Ok to shower in 24 hours, but do not bathe or submerge incisions underwater. ?Do not drive while taking narcotic pain medication. ? ?Wound Care ?Your incisions are covered with skin glue called Dermabond. This will peel off on its own over time. ?You may shower and allow warm soapy water to run over your incisions. Gently pat dry. ?Do not submerge your incision underwater. ?Monitor your incision for any new redness, tenderness, or drainage. ? ?When to Call us: ?Fever greater than 100.5 ?New redness, drainage, or swelling at incision site ?Severe pain, nausea, or vomiting ?Jaundice (yellowing of the whites of the eyes or skin) ? ?Follow-up ?You have an appointment scheduled with Dr. Zenia Resides on February 16, 2022 at 10:20am. This will be at the [facility] office at [address] [place], [place], [place]. Please arrive at least 15 minutes prior to your scheduled appointment time. ? ?For questions or concerns, please call the office at [phone]. ? ?

## 2022-01-22 NOTE — Op Note (Signed)
Date: 01/22/22 ? ?Patient: Miranda Shah ?MRN: [id number] ? ?Preoperative Diagnosis: Symptomatic cholelithiasis ?Postoperative Diagnosis: Same ? ?Procedure: Laparoscopic cholecystectomy ? ?Surgeon: Sophronia Simas, MD ?Assistant: Patrici Ranks, RNFA ? ?EBL: Minimal ? ?Anesthesia: General endotracheal ? ?Specimens: Gallbladder ? ?Indications: [name] is a 46 yo female who presented with postprandial epigastric abdominal pain with radiation to the flank. US showed cholelithiasis. After a discussion of the risks and benefits of surgery, she agreed to proceed with cholecystectomy. ? ?Findings: Cholelithiasis without signs of acute cholecystitis. ? ?Procedure details: Informed consent was obtained in the preoperative area prior to the procedure. The patient was brought to the operating room and placed on the table in the supine position. General anesthesia was induced and appropriate lines and drains were placed for intraoperative monitoring. Perioperative antibiotics were administered per SCIP guidelines. The abdomen was prepped and draped in the usual sterile fashion. A pre-procedure timeout was taken verifying patient identity, surgical site and procedure to be performed. ? ?A small infraumbilical skin incision was made, the subcutaneous tissue was divided with cautery, and the umbilical stalk was grasped and elevated. The fascia was incised and the peritoneal cavity was directly visualized. A 70mm Hassan trocar was placed and the abdomen was insufflated. The peritoneal cavity was inspected with no evidence of visceral or vascular injury. Three 33mm ports were placed in the right subcostal margin, all under direct visualization. The fundus of the gallbladder was grasped and retracted cephalad. There were minimal omental adhesions to the gallbladder which were taken down with cautery. The infundibulum of the gallbladder was retracted laterally. The cystic triangle was dissected out using cautery and blunt dissection, and  the critical view of safety was obtained. The cystic duct and cystic artery were clipped and ligated, leaving 2 clips on the cystic duct stump. The gallbladder was taken off the liver using cautery. The specimen was placed in an endocatch bag. The surgical site was irrigated with saline until the effluent was clear. Hemostasis was achieved in the gallbladder fossa using cautery. The cystic duct and artery stumps were visually inspected and there was no evidence of bile leak or bleeding. The ports were removed under direct visualization and the abdomen was desufflated. The specimen was removed via the umbilical port site, and the umbilical port site fascia was closed with a 0 vicryl pursestring suture. The skin at all port sites was closed with 4-0 monocryl subcuticular suture. Dermabond was applied. ? ?The patient tolerated the procedure well with no apparent complications. All counts were correct x2 at the end of the procedure. The patient was extubated and taken to PACU in stable condition. ? ?Sophronia Simas, MD ?01/22/22 ?8:32 AM ? ? ?

## 2022-01-22 NOTE — Anesthesia Procedure Notes (Signed)
Procedure Name: Intubation ?Date/Time: 01/22/2022 7:40 AM ?Performed by: Griffin Dakin, CRNA ?Pre-anesthesia Checklist: Patient identified, Emergency Drugs available, Suction available and Patient being monitored ?Patient Re-evaluated:Patient Re-evaluated prior to induction ?Oxygen Delivery Method: Circle system utilized ?Preoxygenation: Pre-oxygenation with 100% oxygen ?Induction Type: IV induction ?Ventilation: Mask ventilation without difficulty and Oral airway inserted - appropriate to patient size ?Laryngoscope Size: Mac and 3 ?Grade View: Grade II ?Tube type: Oral ?Tube size: 7.0 mm ?Number of attempts: 1 ?Airway Equipment and Method: Stylet and Oral airway ?Placement Confirmation: ETT inserted through vocal cords under direct vision, positive ETCO2 and breath sounds checked- equal and bilateral ?Secured at: 23 cm ?Tube secured with: Tape ?Dental Injury: Teeth and Oropharynx as per pre-operative assessment  ? ? ? ? ?

## 2022-01-22 NOTE — H&P (Signed)
Miranda Shah is an 46 y.o. female.   ?Chief Complaint: abdominal pain ?HPI: Miranda Shah is a 46 yo female who was referred with abdominal pain and gallstones. She was diagnosed with gallstones during her pregnancy several years ago, and subsequently began having postprandial abdominal pain. Her most recent RUQ [place] on 04/02/21 showed cholelithiasis without acute cholecystitis. She presents today for elective cholecystectomy. She has continued to have intermittent pain. ? ?Her only prior abdominal surgery is a C section. ? ?Past Medical History:  ?Diagnosis Date  ? Anemia   ? Elevated liver enzymes 08/17/2018  ? Gestational hypertension   ? Seborrheic dermatitis   ? ? ?Past Surgical History:  ?Procedure Laterality Date  ? CESAREAN SECTION N/A 09/08/2018  ? Procedure: CESAREAN SECTION;  Surgeon: Essie Hart, MD;  Location: [facility] BIRTHING SUITES;  Service: Obstetrics;  Laterality: N/A;  ? ? ?Family History  ?Problem Relation Age of Onset  ? Diabetes Mother   ? Hypertension Mother   ? Hypertension Father   ? Hyperlipidemia Maternal Grandmother   ? ?Social History:  reports that she quit smoking about 8 years ago. Her smoking use included cigarettes. She smoked an average of .5 packs per day. She has never used smokeless tobacco. She reports that she does not currently use alcohol. She reports that she does not currently use drugs after having used the following drugs: Marijuana. ? ?Allergies:  ?Allergies  ?Allergen Reactions  ? Bee Pollen Itching  ?  Itching, red eyes. sneezing ?Itching, red eyes. sneezing  ? Pecan Extract Allergy Skin Test Itching  ?  Pecan and walnuts makes Mouth itch  ? Pollen Extract Itching  ?  Itching, red eyes. sneezing  ? ? ?Medications Prior to Admission  ?Medication Sig Dispense Refill  ? loratadine-pseudoephedrine (CLARITIN-D 24-HOUR) 10-240 MG 24 hr tablet Take 1 tablet by mouth daily as needed for allergies.    ? Vitamin D, Ergocalciferol, (DRISDOL) 1.25 MG (50000 UNIT) CAPS capsule Take 1 capsule  (50,000 Units total) by mouth every 7 (seven) days. 12 capsule 1  ? ? ?Results for orders placed or performed during the hospital encounter of 01/22/22 (from the past 48 hour(s))  ?Pregnancy, urine POC     Status: None  ? Collection Time: 01/22/22  6:12 AM  ?Result Value Ref Range  ? Preg Test, Ur NEGATIVE NEGATIVE  ?  Comment:        ?THE SENSITIVITY OF THIS ?METHODOLOGY IS >24 mIU/mL ?  ? ?No results found. ? ?Review of Systems ? ?Blood pressure (!) 145/79, pulse 81, temperature 98.2 ?F (36.8 ?C), temperature source Oral, resp. rate 18, height 5\' 3"  (1.6 m), weight 92.5 kg, last menstrual period 01/18/2022, SpO2 95 %, unknown if currently breastfeeding. ?Physical Exam ?Vitals reviewed.  ?Constitutional:   ?   Appearance: Normal appearance.  ?HENT:  ?   Head: Normocephalic and atraumatic.  ?Eyes:  ?   General: No scleral icterus. ?   Conjunctiva/sclera: Conjunctivae normal.  ?Pulmonary:  ?   Effort: Pulmonary effort is normal. No respiratory distress.  ?Abdominal:  ?   General: There is no distension.  ?   Palpations: Abdomen is soft.  ?   Tenderness: There is no abdominal tenderness.  ?Musculoskeletal:     ?   General: Normal range of motion.  ?   Cervical back: Normal range of motion.  ?Skin: ?   General: Skin is warm and dry.  ?Neurological:  ?   General: No focal deficit present.  ?  Mental Status: She is alert and oriented to person, place, and time.  ?Psychiatric:     ?   Mood and Affect: Mood normal.     ?   Behavior: Behavior normal.  ?  ? ?Assessment/Plan ?46 yo female with symptomatic cholelithiasis. Proceed to OR for laparoscopic cholecystectomy. Informed consent obtained. Plan for discharge home from PACU. ? ?Fritzi Mandes, MD ?01/22/2022, 7:21 AM ? ? ? ?

## 2022-01-25 ENCOUNTER — Encounter (HOSPITAL_COMMUNITY): Payer: Self-pay | Admitting: Surgery

## 2022-01-25 LAB — SURGICAL PATHOLOGY

## 2022-01-25 NOTE — Anesthesia Postprocedure Evaluation (Signed)
Anesthesia Post Note ? ?Patient: Miranda Shah ? ?Procedure(s) Performed: LAPAROSCOPIC CHOLECYSTECTOMY ? ?  ? ?Patient location during evaluation: PACU ?Anesthesia Type: General ?Level of consciousness: awake and alert ?Pain management: pain level controlled ?Vital Signs Assessment: post-procedure vital signs reviewed and stable ?Respiratory status: spontaneous breathing, nonlabored ventilation, respiratory function stable and patient connected to nasal cannula oxygen ?Cardiovascular status: blood pressure returned to baseline and stable ?Postop Assessment: no apparent nausea or vomiting ?Anesthetic complications: no ? ? ?No notable events documented. ? ?Last Vitals:  ?Vitals:  ? 01/22/22 1019 01/22/22 1034  ?BP: 107/84 115/72  ?Pulse: 67 63  ?Resp: (!) 9 (!) 9  ?Temp:  36.4 ?C  ?SpO2: 100% 100%  ?  ?Last Pain:  ?Vitals:  ? 01/22/22 0934  ?TempSrc:   ?PainSc: Asleep  ? ? ?  ?  ?  ?  ?  ?  ? ?[name] ? ? ? ? ?

## 2022-03-23 ENCOUNTER — Encounter: Payer: Self-pay | Admitting: Nurse Practitioner

## 2022-03-23 ENCOUNTER — Ambulatory Visit (INDEPENDENT_AMBULATORY_CARE_PROVIDER_SITE_OTHER): Payer: BC Managed Care – PPO | Admitting: Nurse Practitioner

## 2022-03-23 VITALS — BP 116/80 | HR 83 | Temp 97.9°F | Ht 62.6 in | Wt 204.0 lb

## 2022-03-23 DIAGNOSIS — Z0000 Encounter for general adult medical examination without abnormal findings: Secondary | ICD-10-CM | POA: Diagnosis not present

## 2022-03-23 DIAGNOSIS — Z9049 Acquired absence of other specified parts of digestive tract: Secondary | ICD-10-CM

## 2022-03-23 DIAGNOSIS — R7303 Prediabetes: Secondary | ICD-10-CM

## 2022-03-23 DIAGNOSIS — E559 Vitamin D deficiency, unspecified: Secondary | ICD-10-CM

## 2022-03-23 DIAGNOSIS — E669 Obesity, unspecified: Secondary | ICD-10-CM | POA: Diagnosis not present

## 2022-03-23 DIAGNOSIS — E7800 Pure hypercholesterolemia, unspecified: Secondary | ICD-10-CM

## 2022-03-23 DIAGNOSIS — R780 Finding of alcohol in blood: Secondary | ICD-10-CM | POA: Diagnosis not present

## 2022-03-23 NOTE — Progress Notes (Signed)
I,Miranda Shah,acting as a scribe for Miranda Moore, FNP.,have documented all relevant documentation on the behalf of Miranda Moore, FNP,as directed by  Miranda Moore, FNP while in the presence of Miranda Moore, FNP.   This visit occurred during the SARS-CoV-2 public health emergency.  Safety protocols were in place, including screening questions prior to the visit, additional usage of staff PPE, and extensive cleaning of exam room while observing appropriate contact time as indicated for disinfecting solutions.  Subjective:     Patient ID: Miranda Shah , female    DOB: 12/04/1975 , 46 y.o.   MRN: [id number]   Chief Complaint  Patient presents with   Annual Exam    HPI  Pt here for HM. Patient goes to [facility] for her Gyn care. She had her Gallbaldder removed March 24th, she is doing okay. She has already done her f/u with General Surgery. Patient does not have any questions or concerns at this time.  Wt Readings from Last 3 Encounters: 03/23/22 : 204 lb (92.5 kg) 01/22/22 : 204 lb (92.5 kg) 01/14/22 : 206 lb 6.4 oz (93.6 kg)      Past Medical History:  Diagnosis Date   Anemia    Elevated liver enzymes 08/17/2018   Gestational hypertension    Seborrheic dermatitis      Family History  Problem Relation Age of Onset   Diabetes Mother    Hypertension Mother    Hypertension Father    Hyperlipidemia Maternal Grandmother      Current Outpatient Medications:    loratadine-pseudoephedrine (CLARITIN-D 24-HOUR) 10-240 MG 24 hr tablet, Take 1 tablet by mouth daily as needed for allergies., Disp: , Rfl:    Vitamin D, Ergocalciferol, (DRISDOL) 1.25 MG (50000 UNIT) CAPS capsule, Take 1 capsule (50,000 Units total) by mouth every 7 (seven) days. (Patient not taking: Reported on 03/23/2022), Disp: 12 capsule, Rfl: 1   Allergies  Allergen Reactions   Bee Pollen Itching    Itching, red eyes. sneezing Itching, red eyes. sneezing   Pecan Extract Allergy Skin Test Itching    Pecan and  walnuts makes Mouth itch   Pollen Extract Itching    Itching, red eyes. sneezing      The patient states she uses none for birth control. Patient's last menstrual period was 03/13/2022.. Negative for Dysmenorrhea and Negative for Menorrhagia. Negative for: breast discharge, breast lump(s), breast pain and breast self exam. Associated symptoms include abnormal vaginal bleeding. Pertinent negatives include abnormal bleeding (hematology), anxiety, decreased libido, depression, difficulty falling sleep, dyspareunia, history of infertility, nocturia, sexual dysfunction, sleep disturbances, urinary incontinence, urinary urgency, vaginal discharge and vaginal itching. Diet regular - reports being up and down. She is having a hard time with work life balance. The patient states her exercise level is minimal - will walk with her son on the weekends.   The patient's tobacco use is:  Social History   Tobacco Use  Smoking Status Former   Packs/day: 0.50   Types: Cigarettes   Quit date: 08/03/2013   Years since quitting: 8.6  Smokeless Tobacco Never   She has been exposed to passive smoke. The patient's alcohol use is:  Social History   Substance and Sexual Activity  Alcohol Use Not Currently   Comment: occasional   Additional information: Last pap 08/24/2020, next one scheduled for 08/25/23.    Review of Systems  Constitutional: Negative.   HENT: Negative.    Eyes: Negative.   Respiratory: Negative.    Cardiovascular: Negative.     Gastrointestinal: Negative.   Endocrine: Negative.   Genitourinary: Negative.   Musculoskeletal: Negative.   Skin: Negative.   Allergic/Immunologic: Negative.   Neurological: Negative.   Hematological: Negative.   Psychiatric/Behavioral: Negative.      Today's Vitals   03/23/22 0903  BP: 116/80  Pulse: 83  Temp: 97.9 F (36.6 C)  Weight: 204 lb (92.5 kg)  Height: 5' 2.6" (1.59 m)  PainSc: 4   PainLoc: Back   Body mass index is 36.6 kg/m.    Objective:  Physical Exam Vitals reviewed.  Constitutional:      General: She is not in acute distress.    Appearance: Normal appearance. She is well-developed. She is obese.  HENT:     Head: Normocephalic and atraumatic.     Right Ear: Hearing, tympanic membrane, ear canal and external ear normal. There is no impacted cerumen.     Left Ear: Hearing, tympanic membrane, ear canal and external ear normal. There is no impacted cerumen.     Nose:     Comments: Deferred - masked    Mouth/Throat:     Comments: Deferred - masked Eyes:     General: Lids are normal.     Extraocular Movements: Extraocular movements intact.     Conjunctiva/sclera: Conjunctivae normal.     Pupils: Pupils are equal, round, and reactive to light.     Funduscopic exam:    Right eye: No papilledema.        Left eye: No papilledema.  Neck:     Thyroid: No thyroid mass.     Vascular: No carotid bruit.  Cardiovascular:     Rate and Rhythm: Normal rate and regular rhythm.     Pulses: Normal pulses.     Heart sounds: Normal heart sounds. No murmur heard. Pulmonary:     Effort: Pulmonary effort is normal. No respiratory distress.     Breath sounds: Normal breath sounds. No wheezing.  Chest:     Chest wall: No mass.  Breasts:    Tanner Score is 5.     Right: Normal. No mass or tenderness.     Left: Normal. No mass or tenderness.  Abdominal:     General: Abdomen is flat. Bowel sounds are normal. There is no distension.     Palpations: Abdomen is soft.     Tenderness: There is no abdominal tenderness.  Genitourinary:    Rectum: Guaiac result negative.  Musculoskeletal:        General: No swelling. Normal range of motion.     Cervical back: Full passive range of motion without pain, normal range of motion and neck supple.     Right lower leg: No edema.     Left lower leg: No edema.  Lymphadenopathy:     Upper Body:     Right upper body: No supraclavicular, axillary or pectoral adenopathy.     Left upper  body: No supraclavicular, axillary or pectoral adenopathy.  Skin:    General: Skin is warm and dry.     Capillary Refill: Capillary refill takes less than 2 seconds.  Neurological:     General: No focal deficit present.     Mental Status: She is alert and oriented to person, place, and time.     Cranial Nerves: No cranial nerve deficit.     Sensory: No sensory deficit.     Motor: No weakness.  Psychiatric:        Mood and Affect: Mood normal.  Behavior: Behavior normal.        Thought Content: Thought content normal.        Judgment: Judgment normal.        Assessment And Plan:     1. Annual visit for general adult medical examination without abnormal findings Behavior modifications discussed and diet history reviewed.   Pt will continue to exercise regularly and modify diet with low GI, plant based foods and decrease intake of processed foods.  Recommend intake of daily multivitamin, Vitamin D, and calcium.  Recommend mammogram for preventive screenings, as well as recommend immunizations that include influenza, TDAP - CMP14+EGFR - CBC - Hemoglobin A1c - Lipid panel  2. Obesity (BMI 35.0-39.9 without comorbidity) Chronic Discussed healthy diet and regular exercise options  Encouraged to exercise at least 150 minutes per week with 2 days of strength training  3. Prediabetes Comments: Stable, encouraged to limit intake of white processed foods and sugary foods. Increase physical activity to at least 150 minutes a week - CMP14+EGFR - CBC - Hemoglobin A1c - Lipid panel  4. Vitamin D deficiency Comments: Normal at last visit, has not been taking any additional vitamin d. Will recheck levels.  - VITAMIN D 25 Hydroxy (Vit-D Deficiency, Fractures)  5. Elevated LDL cholesterol level Comments: Slightly elevated LDL, encouraged to eat a low fat diet and increase physical activity  6. History of cholecystectomy     Patient was given opportunity to ask questions.  Patient verbalized understanding of the plan and was able to repeat key elements of the plan. All questions were answered to their satisfaction.   Miranda Moore, FNP   I, Miranda Moore, FNP, have reviewed all documentation for this visit. The documentation on 03/23/22 for the exam, diagnosis, procedures, and orders are all accurate and complete.  THE PATIENT IS ENCOURAGED TO PRACTICE SOCIAL DISTANCING DUE TO THE COVID-19 PANDEMIC.   

## 2022-03-23 NOTE — Patient Instructions (Signed)

## 2022-03-24 ENCOUNTER — Other Ambulatory Visit: Payer: Self-pay | Admitting: Nurse Practitioner

## 2022-03-24 DIAGNOSIS — E559 Vitamin D deficiency, unspecified: Secondary | ICD-10-CM

## 2022-03-24 LAB — CMP14+EGFR
ALT: 12 IU/L (ref 0–32)
AST: 13 IU/L (ref 0–40)
Albumin/Globulin Ratio: 1 — ABNORMAL LOW (ref 1.2–2.2)
Albumin: 4 g/dL (ref 3.8–4.8)
Alkaline Phosphatase: 72 IU/L (ref 44–121)
BUN/Creatinine Ratio: 13 (ref 9–23)
BUN: 11 mg/dL (ref 6–24)
Bilirubin Total: 0.3 mg/dL (ref 0.0–1.2)
CO2: 24 mmol/L (ref 20–29)
Calcium: 9.3 mg/dL (ref 8.7–10.2)
Chloride: 100 mmol/L (ref 96–106)
Creatinine, Ser: 0.85 mg/dL (ref 0.57–1.00)
Globulin, Total: 3.9 g/dL (ref 1.5–4.5)
Glucose: 93 mg/dL (ref 70–99)
Potassium: 4.1 mmol/L (ref 3.5–5.2)
Sodium: 138 mmol/L (ref 134–144)
Total Protein: 7.9 g/dL (ref 6.0–8.5)
eGFR: 87 mL/min/{1.73_m2} (ref >59)

## 2022-03-24 LAB — CBC
Hematocrit: 35.1 % (ref 34.0–46.6)
Hemoglobin: 11.5 g/dL (ref 11.1–15.9)
MCH: 28 pg (ref 26.6–33.0)
MCHC: 32.8 g/dL (ref 31.5–35.7)
MCV: 86 fL (ref 79–97)
Platelets: 307 10*3/uL (ref 150–450)
RBC: 4.1 x10E6/uL (ref 3.77–5.28)
RDW: 13.5 % (ref 11.7–15.4)
WBC: 9.4 10*3/uL (ref 3.4–10.8)

## 2022-03-24 LAB — LIPID PANEL
Chol/HDL Ratio: 3.1 ratio (ref 0.0–4.4)
Cholesterol, Total: 193 mg/dL (ref 100–199)
HDL: 62 mg/dL (ref >39)
LDL Chol Calc (NIH): 118 mg/dL — ABNORMAL HIGH (ref 0–99)
Triglycerides: 70 mg/dL (ref 0–149)
VLDL Cholesterol Cal: 13 mg/dL (ref 5–40)

## 2022-03-24 LAB — HEMOGLOBIN A1C
Est. average glucose Bld gHb Est-mCnc: 120 mg/dL
Hgb A1c MFr Bld: 5.8 % — ABNORMAL HIGH (ref 4.8–5.6)

## 2022-03-24 LAB — VITAMIN D 25 HYDROXY (VIT D DEFICIENCY, FRACTURES): Vit D, 25-Hydroxy: 22.4 ng/mL — ABNORMAL LOW (ref 30.0–100.0)

## 2022-03-24 MED ORDER — LORATADINE-PSEUDOEPHEDRINE ER 10-240 MG PO TB24: 1.0000 | ORAL_TABLET | Freq: Every day | ORAL | 1 refills | Status: DC | PRN

## 2022-05-27 ENCOUNTER — Encounter: Payer: Self-pay | Admitting: Nurse Practitioner

## 2022-05-27 ENCOUNTER — Telehealth (INDEPENDENT_AMBULATORY_CARE_PROVIDER_SITE_OTHER): Payer: BC Managed Care – PPO | Admitting: Nurse Practitioner

## 2022-05-27 DIAGNOSIS — L298 Other pruritus: Secondary | ICD-10-CM | POA: Diagnosis not present

## 2022-05-27 DIAGNOSIS — E559 Vitamin D deficiency, unspecified: Secondary | ICD-10-CM | POA: Diagnosis not present

## 2022-05-27 MED ORDER — MOMETASONE FUROATE 50 MCG/ACT NA SUSP: 2.0000 | Freq: Every day | NASAL | 2 refills | Status: DC

## 2022-05-27 MED ORDER — OLOPATADINE HCL 0.2 % OP SOLN: 2.0000 [drp] | Freq: Every day | OPHTHALMIC | 0 refills | Status: DC

## 2022-05-27 NOTE — Progress Notes (Signed)
Virtual Visit via Telephone due to failed video could see patient but could not hear her    This visit type was conducted due to national recommendations for restrictions regarding the COVID-19 Pandemic (e.g. social distancing) in an effort to limit this patient's exposure and mitigate transmission in our community.  Patients identity confirmed using two different identifiers.  This format is felt to be most appropriate for this patient at this time.  All issues noted in this document were discussed and addressed.  No physical exam was performed (except for noted visual exam findings with Video Visits).    Date:  05/27/2022   ID:  Miranda Shah, DOB 28-Mar-1976, MRN [id number]  Patient Location:  Home - spoke with [name]  Provider location:   Office    Chief Complaint:  Eye drainage and congestion  History of Present Illness:    Miranda Shah is a 46 y.o. female who presents via video conferencing for a telehealth visit today.    The patient does have symptoms concerning for COVID-19 infection (fever, chills, cough, or new shortness of breath).   Patient presents today for eye discharge and congestion. Constant itching to eyes, no matting or crusting. She is having a stringy type drainage from her eyes. She states that she has had these symptoms for about a week. Pt has taken a home covid test with negative result.   When she took her first high dose vitamin d supplement she felt like her heart was racing, headache and her blood pressure was up to 90 and 95 diastolic.      Past Medical History:  Diagnosis Date   Anemia    Elevated liver enzymes 08/17/2018   Gestational hypertension    Seborrheic dermatitis    Past Surgical History:  Procedure Laterality Date   CESAREAN SECTION N/A 09/08/2018   Procedure: CESAREAN SECTION;  Surgeon: Essie Hart, MD;  Location: [facility];  Service: Obstetrics;  Laterality: N/A;   CHOLECYSTECTOMY N/A 01/22/2022   Procedure:  LAPAROSCOPIC CHOLECYSTECTOMY;  Surgeon: Fritzi Mandes, MD;  Location: [facility] OR;  Service: General;  Laterality: N/A;     Current Meds  Medication Sig   loratadine-pseudoephedrine (CLARITIN-D 24-HOUR) 10-240 MG 24 hr tablet Take 1 tablet by mouth daily as needed for allergies.   mometasone (NASONEX) 50 MCG/ACT nasal spray Place 2 sprays into the nose daily.   Olopatadine HCl 0.2 % SOLN Apply 2 drops to eye daily.   Vitamin D, Ergocalciferol, (DRISDOL) 1.25 MG (50000 UNIT) CAPS capsule Take 1 capsule (50,000 Units total) by mouth every 7 (seven) days.     Allergies:   Bee pollen, Pecan extract allergy skin test, and Pollen extract   Social History   Tobacco Use   Smoking status: Former    Packs/day: 0.50    Types: Cigarettes    Quit date: 08/03/2013    Years since quitting: 8.8   Smokeless tobacco: Never  Vaping Use   Vaping Use: Never used  Substance Use Topics   Alcohol use: Not Currently    Comment: occasional   Drug use: Not Currently    Types: Marijuana    Comment: none since college     Family Hx: The patient's family history includes Diabetes in her mother; Hyperlipidemia in her maternal grandmother; Hypertension in her father and mother.  ROS:   Please see the history of present illness.    Review of Systems  HENT:  Positive for congestion.   Eyes:  Positive  for discharge and redness.  Respiratory: Negative.    Psychiatric/Behavioral: Negative.      All other systems reviewed and are negative.   Labs/Other Tests and Data Reviewed:    Recent Labs: 03/23/2022: ALT 12; BUN 11; Creatinine, Ser 0.85; Hemoglobin 11.5; Platelets 307; Potassium 4.1; Sodium 138   Recent Lipid Panel Lab Results  Component Value Date/Time   CHOL 193 03/23/2022 10:10 AM   TRIG 70 03/23/2022 10:10 AM   HDL 62 03/23/2022 10:10 AM   CHOLHDL 3.1 03/23/2022 10:10 AM   LDLCALC 118 (H) 03/23/2022 10:10 AM    Wt Readings from Last 3 Encounters:  03/23/22 204 lb (92.5 kg)  01/22/22 204 lb  (92.5 kg)  01/14/22 206 lb 6.4 oz (93.6 kg)     Exam:    Vital Signs:  There were no vitals taken for this visit.    Physical Exam Vitals reviewed.  Constitutional:      General: She is not in acute distress.    Appearance: Normal appearance. She is obese.  Pulmonary:     Effort: Pulmonary effort is normal. No respiratory distress.     Breath sounds: No wheezing.  Neurological:     General: No focal deficit present.     Mental Status: She is alert and oriented to person, place, and time. Mental status is at baseline.     Cranial Nerves: No cranial nerve deficit.     Motor: No weakness.  Psychiatric:        Mood and Affect: Mood and affect normal.        Behavior: Behavior normal.        Thought Content: Thought content normal.        Cognition and Memory: Memory normal.        Judgment: Judgment normal.     ASSESSMENT & PLAN:    1. Pruritus of both eyes Likely conjuntivitis related to seasonal allergies, will continue Pataday and she is to try nasonex. - Olopatadine HCl 0.2 % SOLN; Apply 2 drops to eye daily.  Dispense: 2.5 mL; Refill: 0 - mometasone (NASONEX) 50 MCG/ACT nasal spray; Place 2 sprays into the nose daily.  Dispense: 1 each; Refill: 2  2. Vitamin D deficiency Advised if has same symptoms with next dose to notify office. I do not typically expect these symptoms with taking vitamin d high dose   COVID-19 Education: The signs and symptoms of COVID-19 were discussed with the patient and how to seek care for testing (follow up with PCP or arrange E-visit).  The importance of social distancing was discussed today.  Patient Risk:   After full review of this patients clinical status, I feel that they are at least moderate risk at this time.  Time:   Today, I have spent 8 minutes/ seconds with the patient with telehealth technology discussing above diagnoses.     Medication Adjustments/Labs and Tests Ordered: Current medicines are reviewed at length with the  patient today.  Concerns regarding medicines are outlined above.   Tests Ordered: No orders of the defined types were placed in this encounter.   Medication Changes: Meds ordered this encounter  Medications   Olopatadine HCl 0.2 % SOLN    Sig: Apply 2 drops to eye daily.    Dispense:  2.5 mL    Refill:  0   mometasone (NASONEX) 50 MCG/ACT nasal spray    Sig: Place 2 sprays into the nose daily.    Dispense:  1 each  Refill:  2    Disposition:  Follow up prn  Signed, Arnette Felts, FNP

## 2022-06-08 DIAGNOSIS — Z9109 Other allergy status, other than to drugs and biological substances: Secondary | ICD-10-CM | POA: Diagnosis not present

## 2022-06-08 DIAGNOSIS — I10 Essential (primary) hypertension: Secondary | ICD-10-CM | POA: Diagnosis not present

## 2022-06-08 DIAGNOSIS — Z7951 Long term (current) use of inhaled steroids: Secondary | ICD-10-CM | POA: Diagnosis not present

## 2022-06-08 DIAGNOSIS — Z87891 Personal history of nicotine dependence: Secondary | ICD-10-CM | POA: Diagnosis not present

## 2022-06-08 DIAGNOSIS — R55 Syncope and collapse: Secondary | ICD-10-CM | POA: Diagnosis not present

## 2022-06-08 DIAGNOSIS — Z91018 Allergy to other foods: Secondary | ICD-10-CM | POA: Diagnosis not present

## 2022-06-08 DIAGNOSIS — N3090 Cystitis, unspecified without hematuria: Secondary | ICD-10-CM | POA: Diagnosis not present

## 2022-06-08 DIAGNOSIS — R42 Dizziness and giddiness: Secondary | ICD-10-CM | POA: Diagnosis not present

## 2022-06-10 DIAGNOSIS — N3000 Acute cystitis without hematuria: Secondary | ICD-10-CM | POA: Diagnosis not present

## 2022-06-14 ENCOUNTER — Encounter: Payer: Self-pay | Admitting: Nurse Practitioner

## 2022-06-14 ENCOUNTER — Ambulatory Visit (INDEPENDENT_AMBULATORY_CARE_PROVIDER_SITE_OTHER): Payer: BC Managed Care – PPO | Admitting: Nurse Practitioner

## 2022-06-14 VITALS — BP 132/78 | HR 77 | Temp 98.1°F | Ht 62.6 in | Wt 207.0 lb

## 2022-06-14 DIAGNOSIS — N3001 Acute cystitis with hematuria: Secondary | ICD-10-CM

## 2022-06-14 DIAGNOSIS — Z6837 Body mass index (BMI) 37.0-37.9, adult: Secondary | ICD-10-CM

## 2022-06-14 DIAGNOSIS — E6609 Other obesity due to excess calories: Secondary | ICD-10-CM | POA: Diagnosis not present

## 2022-06-14 DIAGNOSIS — R030 Elevated blood-pressure reading, without diagnosis of hypertension: Secondary | ICD-10-CM

## 2022-06-14 MED ORDER — NITROFURANTOIN MONOHYD MACRO 100 MG PO CAPS: 100.0000 mg | ORAL_CAPSULE | Freq: Two times a day (BID) | ORAL | 0 refills | Status: DC

## 2022-06-14 NOTE — Progress Notes (Signed)
I,Tianna Badgett,acting as a Neurosurgeon for [employer], FNP.,have documented all relevant documentation on the behalf of Arnette Felts, FNP,as directed by  Arnette Felts, FNP while in the presence of Arnette Felts, FNP.  Subjective:     Patient ID: Miranda Shah , female    DOB: Dec 21, 1975 , 46 y.o.   MRN: [id number]   Chief Complaint  Patient presents with   Hypertension    HPI  Patient presents today for elevated blood pressure readings. Patient states that she went to the ED from work for dizziness and nausea. Her blood pressure was 168/90, 171/103 with standing. She had a urinary tract infection. She did not feel like she was urinating more frequently. She is drinking about 40 oz water while at work. She has stopped drinking coffee. She has been taking her blood pressure since that time and has just today having a normal blood pressure. She did walk for 30 minutes yesterday, blood pressure was in normal range and did not feel like she was going to pass out. She is not having as much of the dizziness.   Wt Readings from Last 3 Encounters: 06/14/22 : 207 lb (93.9 kg) 03/23/22 : 204 lb (92.5 kg) 01/22/22 : 204 lb (92.5 kg)         Past Medical History:  Diagnosis Date   Anemia    Elevated liver enzymes 08/17/2018   Gestational hypertension    Seborrheic dermatitis      Family History  Problem Relation Age of Onset   Diabetes Mother    Hypertension Mother    Hypertension Father    Hyperlipidemia Maternal Grandmother      Current Outpatient Medications:    mometasone (NASONEX) 50 MCG/ACT nasal spray, Place 2 sprays into the nose daily., Disp: 1 each, Rfl: 2   nitrofurantoin, macrocrystal-monohydrate, (MACROBID) 100 MG capsule, Take 1 capsule (100 mg total) by mouth 2 (two) times daily., Disp: 4 capsule, Rfl: 0   Olopatadine HCl 0.2 % SOLN, Apply 2 drops to eye daily., Disp: 2.5 mL, Rfl: 0   Vitamin D, Ergocalciferol, (DRISDOL) 1.25 MG (50000 UNIT) CAPS capsule, Take 1  capsule (50,000 Units total) by mouth every 7 (seven) days., Disp: 12 capsule, Rfl: 1   Allergies  Allergen Reactions   Bee Pollen Itching    Itching, red eyes. sneezing Itching, red eyes. sneezing   Pecan Extract Allergy Skin Test Itching    Pecan and walnuts makes Mouth itch   Pollen Extract Itching    Itching, red eyes. sneezing     Review of Systems  Constitutional: Negative.   Respiratory: Negative.    Cardiovascular: Negative.   Genitourinary: Negative.        Currently on nitrofuratoin for 2 more days.   Neurological:  Positive for light-headedness. Negative for dizziness, syncope, weakness and headaches.  Psychiatric/Behavioral: Negative.       Today's Vitals   06/14/22 1103  BP: 132/78  Pulse: 77  Temp: 98.1 F (36.7 C)  TempSrc: Oral  Weight: 207 lb (93.9 kg)  Height: 5' 2.6" (1.59 m)   Body mass index is 37.14 kg/m.  Wt Readings from Last 3 Encounters:  06/14/22 207 lb (93.9 kg)  03/23/22 204 lb (92.5 kg)  01/22/22 204 lb (92.5 kg)    Objective:  Physical Exam Vitals reviewed.  Constitutional:      General: She is not in acute distress.    Appearance: Normal appearance.  Cardiovascular:     Rate and Rhythm: Normal rate and  regular rhythm.     Pulses: Normal pulses.     Heart sounds: Normal heart sounds. No murmur heard. Pulmonary:     Effort: Pulmonary effort is normal. No respiratory distress.     Breath sounds: Normal breath sounds. No wheezing.  Skin:    General: Skin is warm and dry.  Neurological:     General: No focal deficit present.     Mental Status: She is alert and oriented to person, place, and time.     Cranial Nerves: No cranial nerve deficit.     Motor: No weakness.  Psychiatric:        Mood and Affect: Mood normal.        Behavior: Behavior normal.        Thought Content: Thought content normal.        Judgment: Judgment normal.         Assessment And Plan:     1. Elevated blood pressure reading without diagnosis of  hypertension Comments: Will check blood pressure daily and send readings on Thursday. Blood pressure today is normal. Limit salt intake.   2. Acute cystitis with hematuria Comments: She was diagnosed at the Urgent care and took a short course, still has symptoms will send additional doses to total 5 days.   3. Class 2 obesity due to excess calories without serious comorbidity with body mass index (BMI) of 37.0 to 37.9 in adult She is encouraged to strive for BMI less than 30 to decrease cardiac risk. Advised to aim for at least 150 minutes of exercise per week.    Patient was given opportunity to ask questions. Patient verbalized understanding of the plan and was able to repeat key elements of the plan. All questions were answered to their satisfaction.  Arnette Felts, FNP   I, Arnette Felts, FNP, have reviewed all documentation for this visit. The documentation on 06/14/22 for the exam, diagnosis, procedures, and orders are all accurate and complete.   IF YOU HAVE BEEN REFERRED TO A SPECIALIST, IT MAY TAKE 1-2 WEEKS TO SCHEDULE/PROCESS THE REFERRAL. IF YOU HAVE NOT HEARD FROM US/SPECIALIST IN TWO WEEKS, PLEASE GIVE [place] A CALL AT [phone].   THE PATIENT IS ENCOURAGED TO PRACTICE SOCIAL DISTANCING DUE TO THE COVID-19 PANDEMIC.

## 2022-06-14 NOTE — Patient Instructions (Signed)
Preventing Hypertension Hypertension, also called high blood pressure, is when the force of blood pumping through the arteries is too strong. Arteries are blood vessels that carry blood from the heart throughout the body. Often, hypertension does not cause symptoms until blood pressure is very high. It is important to have your blood pressure checked regularly. Diet and lifestyle changes can help you prevent hypertension, and they may make you feel better overall and improve your quality of life. If you already have hypertension, you may control it with diet and lifestyle changes, as well as with medicine. How can this condition affect me? Over time, hypertension can damage the arteries and decrease blood flow to important parts of the body, including the brain, heart, and kidneys. By keeping your blood pressure in a healthy range, you can help prevent complications like heart attack, heart failure, stroke, kidney failure, and vascular dementia. What can increase my risk? An unhealthy diet and a lack of physical activity can make you more likely to develop high blood pressure. Some other risk factors include: Age. The risk increases with age. Having family members who have had high blood pressure. Having certain health conditions, such as thyroid problems. Being overweight or obese. Drinking too much alcohol or caffeine. Having too much fat, sugar, calories, or salt (sodium) in your diet. Smoking or using illegal drugs. Taking certain medicines, such as antidepressants, decongestants, birth control pills, and NSAIDs, such as ibuprofen. What actions can I take to prevent or manage this condition? Work with your health care provider to make a hypertension prevention plan that works for you. You may be referred for counseling on a healthy diet and physical activity. Follow your plan and keep all follow-up visits. Diet changes Maintain a healthy diet. This includes: Eating less salt (sodium). Ask your  health care provider how much sodium is safe for you to have. The general recommendation is to have less than 1 tsp (2,300 mg) of sodium a day. Do not add salt to your food. Choose low-sodium options when grocery shopping and eating out. Limiting fats in your diet. You can do this by eating low-fat or fat-free dairy products and by eating less red meat. Eating more fruits, vegetables, and whole grains. Make a goal to eat: 1-2 cups of fresh fruits and vegetables each day. 3-4 servings of whole grains each day. Avoiding foods and beverages that have added sugars. Eating fish that contain healthy fats (omega-3 fatty acids), such as mackerel or salmon. If you need help putting together a healthy eating plan, try the DASH diet. This diet is high in fruits, vegetables, and whole grains. It is low in sodium, red meat, and added sugars. DASH stands for Dietary Approaches to Stop Hypertension. Lifestyle changes  Lose weight if you are overweight. Losing just 3-5% of your body weight can help prevent or control hypertension. For example, if your present weight is 200 lb (91 kg), a loss of 3-5% of your weight means losing 6-10 lb (2.7-4.5 kg). Ask your health care provider to help you with a diet and exercise plan to safely lose weight. Get enough exercise. Do at least 150 minutes of moderate-intensity exercise each week. You could do this in short exercise sessions several times a day, or you could do longer exercise sessions a few times a week. For example, you could take a brisk 10-minute walk or bike ride, 3 times a day, for 5 days a week. Find ways to reduce stress, such as exercising, meditating, listening to   music, or taking a yoga class. If you need help reducing stress, ask your health care provider. Do not use any products that contain nicotine or tobacco. These products include cigarettes, chewing tobacco, and vaping devices, such as e-cigarettes. Chemicals in tobacco and nicotine products raise your  blood pressure each time you use them. If you need help quitting, ask your health care provider. Learn how to check your blood pressure at home. Make sure that you know your personal target blood pressure, as told by your health care provider. Try to sleep 7-9 hours per night. Alcohol use Do not drink alcohol if: Your health care provider tells you not to drink. You are pregnant, may be pregnant, or are planning to become pregnant. If you drink alcohol: Limit how much you have to: 0-1 drink a day for women. 0-2 drinks a day for men. Know how much alcohol is in your drink. In the [place] one drink equals one 12 oz bottle of beer (355 mL), one 5 oz glass of wine (148 mL), or one 1 oz glass of hard liquor (44 mL). Medicines In addition to diet and lifestyle changes, your health care provider may recommend medicines to help lower your blood pressure. In general: You may need to try a few different medicines to find what works best for you. You may need to take more than one medicine. Take over-the-counter and prescription medicines only as told by your health care provider. Questions to ask your health care provider What is my blood pressure goal? How can I lower my risk for high blood pressure? How should I monitor my blood pressure at home? Where to find support Your health care provider can help you prevent hypertension and help you keep your blood pressure at a healthy level. Your local hospital or your community may also provide support services and prevention programs. The American Heart Association offers an online support network at supportnetwork.heart.org Where to find more information Learn more about hypertension from: National Heart, Lung, and Blood Institute: www.nhlbi.nih.gov Centers for Disease Control and Prevention: www.cdc.gov American Academy of Family Physicians: familydoctor.org Learn more about the DASH diet from: National Heart, Lung, and Blood Institute:  www.nhlbi.nih.gov Contact a health care provider if: You think you are having a reaction to medicines you have taken. You have recurrent headaches or feel dizzy. You have swelling in your ankles. You have trouble with your vision. Get help right away if: You have sudden, severe chest, back, or abdominal pain or discomfort. You have shortness of breath. You have a sudden, severe headache. These symptoms may be an emergency. Get help right away. Call 911. Do not wait to see if the symptoms will go away. Do not drive yourself to the hospital. Summary Hypertension often does not cause any symptoms until blood pressure is very high. It is important to get your blood pressure checked regularly. Diet and lifestyle changes are important steps in preventing hypertension. By keeping your blood pressure in a healthy range, you may prevent complications like heart attack, heart failure, stroke, and kidney failure. Work with your health care provider to make a hypertension prevention plan that works for you. This information is not intended to replace advice given to you by your health care provider. Make sure you discuss any questions you have with your health care provider. Document Revised: 08/06/2021 Document Reviewed: 08/06/2021 Elsevier Patient Education  2023 Elsevier Inc.  

## 2022-06-15 DIAGNOSIS — F32A Depression, unspecified: Secondary | ICD-10-CM | POA: Diagnosis not present

## 2022-06-15 DIAGNOSIS — R5383 Other fatigue: Secondary | ICD-10-CM | POA: Diagnosis not present

## 2022-06-15 DIAGNOSIS — Z87891 Personal history of nicotine dependence: Secondary | ICD-10-CM | POA: Diagnosis not present

## 2022-06-15 DIAGNOSIS — R0789 Other chest pain: Secondary | ICD-10-CM | POA: Diagnosis not present

## 2022-06-15 DIAGNOSIS — Z91018 Allergy to other foods: Secondary | ICD-10-CM | POA: Diagnosis not present

## 2022-06-15 DIAGNOSIS — F419 Anxiety disorder, unspecified: Secondary | ICD-10-CM | POA: Diagnosis not present

## 2022-06-15 DIAGNOSIS — Z79899 Other long term (current) drug therapy: Secondary | ICD-10-CM | POA: Diagnosis not present

## 2022-06-15 DIAGNOSIS — Z9109 Other allergy status, other than to drugs and biological substances: Secondary | ICD-10-CM | POA: Diagnosis not present

## 2022-06-15 DIAGNOSIS — R079 Chest pain, unspecified: Secondary | ICD-10-CM | POA: Diagnosis not present

## 2022-06-16 DIAGNOSIS — B079 Viral wart, unspecified: Secondary | ICD-10-CM | POA: Insufficient documentation

## 2022-06-16 DIAGNOSIS — L219 Seborrheic dermatitis, unspecified: Secondary | ICD-10-CM | POA: Diagnosis not present

## 2022-06-21 ENCOUNTER — Ambulatory Visit (INDEPENDENT_AMBULATORY_CARE_PROVIDER_SITE_OTHER): Payer: BC Managed Care – PPO | Admitting: Nurse Practitioner

## 2022-06-21 ENCOUNTER — Encounter: Payer: Self-pay | Admitting: Nurse Practitioner

## 2022-06-21 VITALS — BP 132/78 | HR 87 | Temp 98.1°F | Ht 62.6 in | Wt 209.0 lb

## 2022-06-21 DIAGNOSIS — Z6837 Body mass index (BMI) 37.0-37.9, adult: Secondary | ICD-10-CM | POA: Diagnosis not present

## 2022-06-21 DIAGNOSIS — R5383 Other fatigue: Secondary | ICD-10-CM

## 2022-06-21 DIAGNOSIS — R6889 Other general symptoms and signs: Secondary | ICD-10-CM

## 2022-06-21 DIAGNOSIS — E6609 Other obesity due to excess calories: Secondary | ICD-10-CM

## 2022-06-21 DIAGNOSIS — E559 Vitamin D deficiency, unspecified: Secondary | ICD-10-CM | POA: Diagnosis not present

## 2022-06-21 NOTE — Progress Notes (Signed)
I,Tianna Badgett,acting as a Neurosurgeon for [employer], FNP.,have documented all relevant documentation on the behalf of Arnette Felts, FNP,as directed by  Arnette Felts, FNP while in the presence of Arnette Felts, FNP.  Subjective:     Patient ID: Miranda Shah , female    DOB: [birth year] , 46 y.o.   MRN: [id number]   Chief Complaint  Patient presents with   Follow-up    HPI  Patient presents today for ED follow up, she did not stay at the ER for a full evaluation. She is having symptoms of heaviness to her neck and shoulders, she was also having a little bit of chest pain.   She does report a history of "anxiety" 25 years ago. The symptoms are not consistent. When she drank the teas of stress tea. 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      Past Medical History:  Diagnosis Date   Anemia    Elevated liver enzymes 08/17/2018   Gestational hypertension    Seborrheic dermatitis      Family History  Problem Relation Age of Onset   Diabetes Mother    Hypertension Mother    Hypertension Father    Hyperlipidemia Maternal Grandmother      Current Outpatient Medications:    betamethasone dipropionate (DIPROLENE) 0.05 % ointment, Apply to affected areas on scalp once daily., Disp: , Rfl:    Ciclopirox 1 % shampoo, Apply topically., Disp: , Rfl:    Fluocinolone Acetonide Scalp 0.01 % OIL, Apply topically., Disp: , Rfl:    imiquimod (ALDARA) 5 % cream, SMARTSIG:1 Topical Daily, Disp: , Rfl:    mometasone (NASONEX) 50 MCG/ACT nasal spray, Place 2 sprays into the nose daily., Disp: 1 each, Rfl: 2   nitrofurantoin, macrocrystal-monohydrate, (MACROBID) 100 MG capsule, Take 1 capsule (100 mg total) by mouth 2 (two) times daily., Disp: 4 capsule, Rfl: 0   Olopatadine HCl 0.2 % SOLN, Apply 2 drops to eye daily., Disp: 2.5 mL, Rfl: 0   Vitamin D, Ergocalciferol, (DRISDOL) 1.25 MG (50000 UNIT) CAPS capsule, Take 1  capsule (50,000 Units total) by mouth every 7 (seven) days., Disp: 12 capsule, Rfl: 1   Allergies  Allergen Reactions   Bee Pollen Itching    Itching, red eyes. sneezing Itching, red eyes. sneezing   Pecan Extract Allergy Skin Test Itching    Pecan and walnuts makes Mouth itch   Pollen Extract Itching    Itching, red eyes. sneezing     Review of Systems  Constitutional:  Positive for fatigue.  Respiratory: Negative.    Cardiovascular: Negative.   Gastrointestinal: Negative.   Neurological: Negative.   Psychiatric/Behavioral: Negative.       Today's Vitals   06/21/22 1237  BP: 132/78  Pulse: 87  Temp: 98.1 F (36.7 C)  TempSrc: Oral  Weight: 209 lb (94.8 kg)  Height: 5' 2.6" (1.59 m)   Body mass index is 37.5 kg/m.  Wt Readings from Last 3 Encounters:  06/21/22 209 lb (94.8 kg)  06/14/22 207 lb (93.9 kg)  03/23/22 204 lb (92.5 kg)    Objective:  Physical Exam Vitals reviewed.  Constitutional:      General: She is not in acute distress.    Appearance: Normal appearance. She is obese.  Cardiovascular:     Rate and Rhythm: Normal rate and regular rhythm.     Pulses: Normal pulses.     Heart sounds:  Normal heart sounds. No murmur heard. Pulmonary:     Effort: Pulmonary effort is normal. No respiratory distress.     Breath sounds: Normal breath sounds. No wheezing.  Skin:    General: Skin is warm and dry.  Neurological:     General: No focal deficit present.     Mental Status: She is alert and oriented to person, place, and time.     Cranial Nerves: No cranial nerve deficit.     Motor: No weakness.  Psychiatric:        Mood and Affect: Mood normal.        Behavior: Behavior normal.        Thought Content: Thought content normal.        Judgment: Judgment normal.         Assessment And Plan:     1. Fatigue, unspecified type Comments: Will check for metabolic causes. Also has a family history of anemia and thyroid problems - Thyroid Panel With TSH -  Iron, TIBC and Ferritin Panel - VITAMIN D 25 Hydroxy (Vit-D Deficiency, Fractures)  2. Sensation of heaviness Comments: Will check for metabolic cause, if these are normal we need to evaluate further for possible anxiety related. Encouraged to take magnesium supplement - Thyroid Panel With TSH - Iron, TIBC and Ferritin Panel - VITAMIN D 25 Hydroxy (Vit-D Deficiency, Fractures)  3. Class 2 obesity due to excess calories without serious comorbidity with body mass index (BMI) of 37.0 to 37.9 in adult She is encouraged to strive for BMI less than 30 to decrease cardiac risk. Advised to aim for at least 150 minutes of exercise per week.    Patient was given opportunity to ask questions. Patient verbalized understanding of the plan and was able to repeat key elements of the plan. All questions were answered to their satisfaction.  Arnette Felts, FNP   I, Arnette Felts, FNP, have reviewed all documentation for this visit. The documentation on 06/21/22 for the exam, diagnosis, procedures, and orders are all accurate and complete.   IF YOU HAVE BEEN REFERRED TO A SPECIALIST, IT MAY TAKE 1-2 WEEKS TO SCHEDULE/PROCESS THE REFERRAL. IF YOU HAVE NOT HEARD FROM US/SPECIALIST IN TWO WEEKS, PLEASE GIVE [place] A CALL AT [phone].   THE PATIENT IS ENCOURAGED TO PRACTICE SOCIAL DISTANCING DUE TO THE COVID-19 PANDEMIC.

## 2022-06-21 NOTE — Patient Instructions (Signed)
   Chest Wall Pain Chest wall pain is pain in or around the bones and muscles of your chest. Chest wall pain may be caused by: An injury. Coughing a lot. Using your chest and arm muscles too much. Sometimes, the cause may not be known. This pain may take a few weeks or longer to get better. Follow these instructions at home: Managing pain, stiffness, and swelling If told, put ice on the painful area: Put ice in a plastic bag. Place a towel between your skin and the bag. Leave the ice on for 20 minutes, 2-3 times a day.  Activity Rest as told by your doctor. Avoid doing things that cause pain. This includes lifting heavy items. Ask your doctor what activities are safe for you. General instructions  Take over-the-counter and prescription medicines only as told by your doctor. Do not use any products that contain nicotine or tobacco, such as cigarettes, e-cigarettes, and chewing tobacco. If you need help quitting, ask your doctor. Keep all follow-up visits as told by your doctor. This is important. Contact a doctor if: You have a fever. Your chest pain gets worse. You have new symptoms. Get help right away if: You feel sick to your stomach (nauseous) or you throw up (vomit). You feel sweaty or light-headed. You have a cough with mucus from your lungs (sputum) or you cough up blood. You are short of breath. These symptoms may be an emergency. Do not wait to see if the symptoms will go away. Get medical help right away. Call your local emergency services (911 in the [place] Do not drive yourself to the hospital. Summary Chest wall pain is pain in or around the bones and muscles of your chest. It may be treated with ice, rest, and medicines. Your condition may also get better if you avoid doing things that cause pain. Contact a doctor if you have a fever, chest pain that gets worse, or new symptoms. Get help right away if you feel light-headed or you get short of breath. These symptoms  may be an emergency. This information is not intended to replace advice given to you by your health care provider. Make sure you discuss any questions you have with your health care provider. Document Revised: 01/20/2021 Document Reviewed: 01/02/2021 Elsevier Patient Education  2023 Elsevier Inc.  

## 2022-06-22 ENCOUNTER — Other Ambulatory Visit: Payer: Self-pay | Admitting: Nurse Practitioner

## 2022-06-22 LAB — IRON,TIBC AND FERRITIN PANEL
Ferritin: 65 ng/mL (ref 15–150)
Iron Saturation: 16 % (ref 15–55)
Iron: 55 ug/dL (ref 27–159)
Total Iron Binding Capacity: 341 ug/dL (ref 250–450)
UIBC: 286 ug/dL (ref 131–425)

## 2022-06-22 LAB — VITAMIN D 25 HYDROXY (VIT D DEFICIENCY, FRACTURES): Vit D, 25-Hydroxy: 32.6 ng/mL (ref 30.0–100.0)

## 2022-06-22 LAB — THYROID PANEL WITH TSH
Free Thyroxine Index: 1.8 (ref 1.2–4.9)
T3 Uptake Ratio: 25 % (ref 24–39)
T4, Total: 7 ug/dL (ref 4.5–12.0)
TSH: 1.86 u[IU]/mL (ref 0.450–4.500)

## 2022-06-22 MED ORDER — HYDROXYZINE HCL 10 MG PO TABS
10.0000 mg | ORAL_TABLET | Freq: Three times a day (TID) | ORAL | 0 refills | Status: DC | PRN
Start: 2022-06-22 — End: 2023-09-27

## 2022-07-16 ENCOUNTER — Encounter: Payer: Self-pay | Admitting: Nurse Practitioner

## 2022-08-21 ENCOUNTER — Other Ambulatory Visit: Payer: Self-pay | Admitting: Nurse Practitioner

## 2022-08-21 DIAGNOSIS — H579 Unspecified disorder of eye and adnexa: Secondary | ICD-10-CM

## 2022-09-20 ENCOUNTER — Ambulatory Visit: Payer: BC Managed Care – PPO | Admitting: Nurse Practitioner

## 2022-10-15 DIAGNOSIS — J029 Acute pharyngitis, unspecified: Secondary | ICD-10-CM | POA: Diagnosis not present

## 2022-10-15 DIAGNOSIS — Z8616 Personal history of COVID-19: Secondary | ICD-10-CM | POA: Diagnosis not present

## 2022-10-15 DIAGNOSIS — R6883 Chills (without fever): Secondary | ICD-10-CM | POA: Diagnosis not present

## 2022-10-15 DIAGNOSIS — Z20822 Contact with and (suspected) exposure to covid-19: Secondary | ICD-10-CM | POA: Diagnosis not present

## 2022-11-25 ENCOUNTER — Encounter: Payer: Self-pay | Admitting: Nurse Practitioner

## 2022-11-25 ENCOUNTER — Ambulatory Visit (INDEPENDENT_AMBULATORY_CARE_PROVIDER_SITE_OTHER): Payer: BC Managed Care – PPO | Admitting: Nurse Practitioner

## 2022-11-25 VITALS — BP 132/76 | HR 83 | Temp 97.8°F | Ht 62.6 in | Wt 216.0 lb

## 2022-11-25 DIAGNOSIS — R7303 Prediabetes: Secondary | ICD-10-CM | POA: Diagnosis not present

## 2022-11-25 DIAGNOSIS — E7800 Pure hypercholesterolemia, unspecified: Secondary | ICD-10-CM | POA: Diagnosis not present

## 2022-11-25 DIAGNOSIS — E6609 Other obesity due to excess calories: Secondary | ICD-10-CM

## 2022-11-25 DIAGNOSIS — Z6838 Body mass index (BMI) 38.0-38.9, adult: Secondary | ICD-10-CM

## 2022-11-25 DIAGNOSIS — E559 Vitamin D deficiency, unspecified: Secondary | ICD-10-CM | POA: Diagnosis not present

## 2022-11-25 NOTE — Patient Instructions (Signed)
Preventing Type 2 Diabetes Mellitus Type 2 diabetes, also called type 2 diabetes mellitus, is a Pilger-term (chronic) disease that affects sugar (glucose) levels in your blood. Normally, a hormone called insulin allows glucose to enter cells in your body. The cells use glucose for energy. With type 2 diabetes, you will have one or both of these problems: Your pancreas does not make enough insulin. Cells in your body do not respond properly to insulin that your body makes (insulin resistance). Insulin resistance or lack of insulin causes extra glucose to build up in the blood instead of going into cells. As a result, high blood glucose (hyperglycemia) develops. That can cause many complications. Being overweight or obese and having an inactive (sedentary) lifestyle can increase your risk for diabetes. Type 2 diabetes can be delayed or prevented by making certain nutrition and lifestyle changes. How can this condition affect me? If you do not take steps to prevent diabetes, your blood glucose levels may keep increasing over time. Too much glucose in your blood for a Beer time can damage your blood vessels, heart, kidneys, nerves, and eyes. Type 2 diabetes can lead to chronic health problems and complications, such as: Heart disease. Stroke. Blindness. Kidney disease. Depression. Poor circulation in your feet and legs. In severe cases, a foot or leg may need to be surgically removed (amputated). What can increase my risk? You may be more likely to develop type 2 diabetes if you: Have type 2 diabetes in your family. Are overweight or obese. Have a sedentary lifestyle. Have insulin resistance or a history of prediabetes. Have a history of pregnancy-related (gestational) diabetes or polycystic ovary syndrome (PCOS). What actions can I take to prevent this? It can be difficult to recognize signs of type 2 diabetes. Taking action to prevent the disease before you develop symptoms is the best way to avoid  possible damage to your body. Making certain nutrition and lifestyle changes may prevent or delay the disease and related health problems. Nutrition  Eat healthy meals and snacks regularly. Do not skip meals. Fruit or a handful of nuts is a healthy snack between meals. Drink water throughout the day. Avoid drinks that contain added sugar, such as soda or sweetened tea. Drink enough fluid to keep your urine pale yellow. Follow instructions from your health care provider about eating or drinking restrictions. Limit the amount of food you eat by: Managing how much you eat at a time (portion size). Checking food labels for the serving sizes of food. Using a kitchen scale to weigh amounts of food. Saut or steam food instead of frying it. Cook with water or broth instead of oils or butter. Limit saturated fat and salt (sodium) in your diet. Have no more than 1 tsp (2,400 mg) of sodium a day. If you have heart disease or high blood pressure, use less than ? tsp (1,500 mg) of sodium a day. Lifestyle  Lose weight if needed and as told. Your health care provider can determine how much weight loss is best for you and can help you lose weight safely. If you are overweight or obese, you may be told to lose at least 5?7% of your body weight. Manage blood pressure, cholesterol, and stress. Your health care provider will help determine the best treatment for you. Do not use any products that contain nicotine or tobacco. These products include cigarettes, chewing tobacco, and vaping devices, such as e-cigarettes. If you need help quitting, ask your health care provider. Activity  Do physical   activity that makes your heart beat faster and makes you sweat (moderate intensity). Do this for at least 30 minutes on at least 5 days of the week, or as much as told by your health care provider. Ask your health care provider what activities are safe for you. A mix of activities may be best, such as walking, swimming,  cycling, and strength training. Try to add physical activity into your day. For example: Park your car farther away than usual so that you walk more. Take a walk during your lunch break. Use stairs instead of elevators or escalators. Walk or bike to work instead of driving. Alcohol use If you drink alcohol: Limit how much you have to: 0?1 drink a day for women who are not pregnant. 0?2 drinks a day for men. Know how much alcohol is in your drink. In the [place] one drink equals one 12 oz bottle of beer (355 mL), one 5 oz glass of wine (148 mL), or one 1 oz glass of hard liquor (44 mL). General information Talk with your health care provider about your risk factors and how you can reduce your risk for diabetes. Have your blood glucose tested regularly, as told by your health care provider. Get screening tests as told by your health care provider. You may have these regularly, especially if you have certain risk factors for type 2 diabetes. Make an appointment with a registered dietitian. This diet and nutrition specialist can help you make a healthy eating plan and help you understand portion sizes and food labels. Where to find support Ask your health care provider to recommend a registered dietitian, a certified diabetes care and education specialist, or a weight loss program. Look for local or online weight loss groups. Join a gym, fitness club, or outdoor activity group, such as a walking club. Where to find more information For help and guidance and to learn more about diabetes and diabetes prevention, visit: American Diabetes Association (ADA): www.diabetes.org National Institute of Diabetes and Digestive and Kidney Diseases: www.niddk.nih.gov To learn more about healthy eating, visit: [place].S. Department of Agriculture (USDA): www.choosemyplate.gov Office of Disease Prevention and Health Promotion (ODPHP): health.gov Summary You can delay or prevent type 2 diabetes by eating healthy  foods, losing weight if needed, and increasing your physical activity. Talk with your health care provider about your risk factors for type 2 diabetes and how you can reduce your risk. It can be difficult to recognize the signs of type 2 diabetes. The best way to avoid possible damage to your body is to take action to prevent the disease before you develop symptoms. Get screening tests as told by your health care provider. This information is not intended to replace advice given to you by your health care provider. Make sure you discuss any questions you have with your health care provider. Document Revised: 01/12/2021 Document Reviewed: 01/12/2021 Elsevier Patient Education  2023 Elsevier Inc.  

## 2022-11-25 NOTE — Progress Notes (Signed)
I,Tianna Badgett,acting as a Education administrator for [employer], FNP.,have documented all relevant documentation on the behalf of Minette Brine, FNP,as directed by  Minette Brine, FNP while in the presence of Minette Brine, [place].  Subjective:     Patient ID: Miranda Shah , female    DOB: 19-May-1976 , 47 y.o.   MRN: [id number]   Chief Complaint  Patient presents with   Hyperlipidemia    HPI  Here today for prediabetes f/u.   Wt Readings from Last 3 Encounters: 11/25/22 : 216 lb (98 kg) 06/21/22 : 209 lb (94.8 kg) 06/14/22 : 207 lb (93.9 kg)  She walks around the neighborhood with her husband and son. She is also using tonal at times. When she does not see her weight moving she will stop exercising. She has a problem not eating.   Hyperlipidemia This is a chronic problem. The problem is controlled. There are no known factors aggravating her hyperlipidemia. Compliance problems include adherence to diet.  Risk factors for coronary artery disease include obesity.     Past Medical History:  Diagnosis Date   Anemia    Elevated liver enzymes 08/17/2018   Gestational hypertension    Seborrheic dermatitis      Family History  Problem Relation Age of Onset   Diabetes Mother    Hypertension Mother    Hypertension Father    Hyperlipidemia Maternal Grandmother      Current Outpatient Medications:    Ciclopirox 1 % shampoo, Apply topically., Disp: , Rfl:    Fluocinolone Acetonide Scalp 0.01 % OIL, Apply topically., Disp: , Rfl:    hydrOXYzine (ATARAX) 10 MG tablet, Take 1 tablet (10 mg total) by mouth 3 (three) times daily as needed., Disp: 30 tablet, Rfl: 0   imiquimod (ALDARA) 5 % cream, SMARTSIG:1 Topical Daily, Disp: , Rfl:    mometasone (NASONEX) 50 MCG/ACT nasal spray, PLACE 2 SPRAYS INTO THE NOSE DAILY., Disp: 51 each, Rfl: 0   betamethasone dipropionate (DIPROLENE) 0.05 % ointment, Apply to affected areas on scalp once daily., Disp: , Rfl:    Olopatadine HCl 0.2 % SOLN, Apply 2 drops to  eye daily., Disp: 2.5 mL, Rfl: 0   Allergies  Allergen Reactions   Bee Pollen Itching    Itching, red eyes. sneezing Itching, red eyes. sneezing   Pecan Extract Allergy Skin Test Itching    Pecan and walnuts makes Mouth itch   Pollen Extract Itching    Itching, red eyes. sneezing     Review of Systems  Constitutional: Negative.   Respiratory: Negative.    Cardiovascular: Negative.   Gastrointestinal: Negative.   Neurological: Negative.   Psychiatric/Behavioral: Negative.       Today's Vitals   11/25/22 0945  BP: 132/76  Pulse: 83  Temp: 97.8 F (36.6 C)  TempSrc: Oral  SpO2: 97%  Weight: 216 lb (98 kg)  Height: 5' 2.6" (1.59 m)   Body mass index is 38.75 kg/m.  Wt Readings from Last 3 Encounters:  11/25/22 216 lb (98 kg)  06/21/22 209 lb (94.8 kg)  06/14/22 207 lb (93.9 kg)    Objective:  Physical Exam Vitals reviewed.  Constitutional:      General: She is not in acute distress.    Appearance: Normal appearance.  Cardiovascular:     Rate and Rhythm: Normal rate and regular rhythm.     Pulses: Normal pulses.     Heart sounds: Normal heart sounds. No murmur heard. Pulmonary:     Effort: Pulmonary effort  is normal. No respiratory distress.     Breath sounds: Normal breath sounds. No wheezing.  Skin:    General: Skin is warm and dry.  Neurological:     General: No focal deficit present.     Mental Status: She is alert and oriented to person, place, and time.     Cranial Nerves: No cranial nerve deficit.     Motor: No weakness.  Psychiatric:        Mood and Affect: Mood normal.        Behavior: Behavior normal.        Thought Content: Thought content normal.        Judgment: Judgment normal.         Assessment And Plan:     1. Prediabetes Comments: Diet controlled, continue focusing on diet low in suger and carbohydrates. - Hemoglobin A1c  2. Elevated LDL cholesterol level Comments: Cholesterol levels are stable, continue focusing on low fat  diet. - Lipid panel  3. Vitamin D deficiency Will check vitamin D level and supplement as needed.    Also encouraged to spend 15 minutes in the sun daily.  - Vitamin D (25 hydroxy)  4. Class 2 obesity due to excess calories without serious comorbidity with body mass index (BMI) of 38.0 to 38.9 in adult  She is encouraged to strive for BMI less than 30 to decrease cardiac risk. Advised to aim for at least 150 minutes of exercise per week.  [place] discussion about increasing her calorie output when exercising. We will re-evaluate medication options at next visit.   Patient was given opportunity to ask questions. Patient verbalized understanding of the plan and was able to repeat key elements of the plan. All questions were answered to their satisfaction.  Minette Brine, FNP   I, Minette Brine, FNP, have reviewed all documentation for this visit. The documentation on 11/25/22 for the exam, diagnosis, procedures, and orders are all accurate and complete.   IF YOU HAVE BEEN REFERRED TO A SPECIALIST, IT MAY TAKE 1-2 WEEKS TO SCHEDULE/PROCESS THE REFERRAL. IF YOU HAVE NOT HEARD FROM US/SPECIALIST IN TWO WEEKS, PLEASE GIVE [place] A CALL AT [phone].   THE PATIENT IS ENCOURAGED TO PRACTICE SOCIAL DISTANCING DUE TO THE COVID-19 PANDEMIC.

## 2022-11-26 LAB — LIPID PANEL
Chol/HDL Ratio: 3.5 ratio (ref 0.0–4.4)
Cholesterol, Total: 194 mg/dL (ref 100–199)
HDL: 56 mg/dL (ref >39)
LDL Chol Calc (NIH): 124 mg/dL — ABNORMAL HIGH (ref 0–99)
Triglycerides: 76 mg/dL (ref 0–149)
VLDL Cholesterol Cal: 14 mg/dL (ref 5–40)

## 2022-11-26 LAB — HEMOGLOBIN A1C
Est. average glucose Bld gHb Est-mCnc: 120 mg/dL
Hgb A1c MFr Bld: 5.8 % — ABNORMAL HIGH (ref 4.8–5.6)

## 2022-11-26 LAB — VITAMIN D 25 HYDROXY (VIT D DEFICIENCY, FRACTURES): Vit D, 25-Hydroxy: 20.5 ng/mL — ABNORMAL LOW (ref 30.0–100.0)

## 2022-11-28 MED ORDER — VITAMIN D (ERGOCALCIFEROL) 1.25 MG (50000 UNIT) PO CAPS
50000.0000 [IU] | ORAL_CAPSULE | ORAL | 1 refills | Status: AC
Start: 2022-11-28 — End: ?

## 2022-12-16 ENCOUNTER — Emergency Department (HOSPITAL_BASED_OUTPATIENT_CLINIC_OR_DEPARTMENT_OTHER)
Admission: EM | Admit: 2022-12-16 | Discharge: 2022-12-16 | Discharge status: Against Medical Advice | Payer: BC Managed Care – PPO | Attending: Emergency Medicine | Admitting: Emergency Medicine

## 2022-12-16 ENCOUNTER — Encounter (HOSPITAL_BASED_OUTPATIENT_CLINIC_OR_DEPARTMENT_OTHER): Payer: Self-pay | Admitting: Urology

## 2022-12-16 ENCOUNTER — Emergency Department (HOSPITAL_BASED_OUTPATIENT_CLINIC_OR_DEPARTMENT_OTHER): Payer: BC Managed Care – PPO

## 2022-12-16 DIAGNOSIS — R072 Precordial pain: Secondary | ICD-10-CM | POA: Diagnosis not present

## 2022-12-16 DIAGNOSIS — Z5321 Procedure and treatment not carried out due to patient leaving prior to being seen by health care provider: Secondary | ICD-10-CM | POA: Diagnosis not present

## 2022-12-16 LAB — BASIC METABOLIC PANEL
Anion gap: 9 (ref 5–15)
BUN: 12 mg/dL (ref 6–20)
CO2: 23 mmol/L (ref 22–32)
Calcium: 8.8 mg/dL — ABNORMAL LOW (ref 8.9–10.3)
Chloride: 103 mmol/L (ref 98–111)
Creatinine, Ser: 0.82 mg/dL (ref 0.44–1.00)
GFR, Estimated: >60 mL/min (ref >60)
Glucose, Bld: 92 mg/dL (ref 70–99)
Potassium: 3.7 mmol/L (ref 3.5–5.1)
Sodium: 135 mmol/L (ref 135–145)

## 2022-12-16 LAB — CBC
HCT: 35.6 % — ABNORMAL LOW (ref 36.0–46.0)
Hemoglobin: 11.8 g/dL — ABNORMAL LOW (ref 12.0–15.0)
MCH: 28 pg (ref 26.0–34.0)
MCHC: 33.1 g/dL (ref 30.0–36.0)
MCV: 84.6 fL (ref 80.0–100.0)
Platelets: 330 10*3/uL (ref 150–400)
RBC: 4.21 MIL/uL (ref 3.87–5.11)
RDW: 13.2 % (ref 11.5–15.5)
WBC: 11.2 10*3/uL — ABNORMAL HIGH (ref 4.0–10.5)
nRBC: 0 % (ref 0.0–0.2)

## 2022-12-16 LAB — TROPONIN I (HIGH SENSITIVITY): Troponin I (High Sensitivity): 2 ng/L (ref <18)

## 2022-12-16 NOTE — ED Triage Notes (Signed)
Pt states mid sternal chest pain radiating to back approx 20 min PTA Took pepto with little relief

## 2022-12-21 ENCOUNTER — Telehealth: Payer: Self-pay

## 2022-12-21 NOTE — Transitions of Care (Post Inpatient/ED Visit) (Signed)
   12/21/2022  Name: JONNE KESECKER MRN: [id number] DOB: 22-May-1976  Today's TOC FU Call Status: Today's TOC FU Call Status:: Successful TOC FU Call Competed TOC FU Call Complete Date: 12/21/22  Transition Care Management Follow-up Telephone Call Date of Discharge: 12/16/22 Discharge Facility: [facility] Type of Discharge: Inpatient Admission Primary Inpatient Discharge Diagnosis:: chest pain How have you been since you were released from the hospital?: Better Any questions or concerns?: No  Items Reviewed: Did you receive and understand the discharge instructions provided?: Yes Medications obtained and verified?: Yes (Medications Reviewed) Any new allergies since your discharge?: No Dietary orders reviewed?: No Do you have support at home?: Yes People in Home: spouse  Home Care and Equipment/Supplies: [place] Ordered?: No Any new equipment or medical supplies ordered?: No  Functional Questionnaire: Do you need assistance with bathing/showering or dressing?: No Do you need assistance with meal preparation?: No Do you need assistance with eating?: No Do you have difficulty maintaining continence: No Do you need assistance with getting out of bed/getting out of a chair/moving?: No Do you have difficulty managing or taking your medications?: No  Folllow up appointments reviewed: PCP Follow-up appointment confirmed?: No MD Provider Line [phone] Given: Yes [facility] Follow-up appointment confirmed?: No Reason Specialist Follow-Up Not Confirmed: Patient has Specialist Provider Number and will Call for Appointment Do you need transportation to your follow-up appointment?: No Do you understand care options if your condition(s) worsen?: Yes-patient verbalized understanding    [place], [place]

## 2023-01-04 ENCOUNTER — Ambulatory Visit (INDEPENDENT_AMBULATORY_CARE_PROVIDER_SITE_OTHER): Payer: BC Managed Care – PPO | Admitting: Nurse Practitioner

## 2023-01-04 ENCOUNTER — Encounter: Payer: Self-pay | Admitting: Nurse Practitioner

## 2023-01-04 VITALS — BP 130/78 | HR 97 | Temp 97.8°F | Ht 63.0 in | Wt 210.0 lb

## 2023-01-04 DIAGNOSIS — Z6837 Body mass index (BMI) 37.0-37.9, adult: Secondary | ICD-10-CM

## 2023-01-04 DIAGNOSIS — E6609 Other obesity due to excess calories: Secondary | ICD-10-CM

## 2023-01-04 DIAGNOSIS — R109 Unspecified abdominal pain: Secondary | ICD-10-CM

## 2023-01-04 DIAGNOSIS — R319 Hematuria, unspecified: Secondary | ICD-10-CM | POA: Diagnosis not present

## 2023-01-04 DIAGNOSIS — Z2821 Immunization not carried out because of patient refusal: Secondary | ICD-10-CM

## 2023-01-04 LAB — POC URINALSYSI DIPSTICK (AUTOMATED)
Bilirubin, UA: NEGATIVE
Glucose, UA: NEGATIVE
Ketones, UA: NEGATIVE
Nitrite, UA: NEGATIVE
Protein, UA: NEGATIVE
Spec Grav, UA: >=1.03 — AB (ref 1.010–1.025)
Urobilinogen, UA: 0.2 E.U./dL
pH, UA: 6 (ref 5.0–8.0)

## 2023-01-04 NOTE — Progress Notes (Signed)
Barnet Glasgow Martin,acting as a Education administrator for Minette Brine, FNP.,have documented all relevant documentation on the behalf of Minette Brine, FNP,as directed by  Minette Brine, FNP while in the presence of Minette Brine, [place].    Subjective:     Patient ID: Miranda Shah , female    DOB: 12-03-1975 , 47 y.o.   MRN: [id number]   Chief Complaint  Patient presents with   Flank Pain    HPI  Patient presents today for thinking she has kidney stone pain.  Patient states she has had this pain before but it has been years ago. Patient states the pain first started last week. Patient states not having any pain today and the pain is on and off. She reports being sick last week. She reports when she has the pain it is a 10 and a sharp pain. Pain lasted 1 minute - felt twice in a minute. This made her jump up.   BP Readings from Last 3 Encounters: 01/04/23 : 130/78 12/16/22 : 132/85 11/25/22 : 132/76    Flank Pain This is a new problem. The current episode started 1 to 4 weeks ago. The problem occurs intermittently. The problem has been gradually improving since onset. The quality of the pain is described as stabbing and shooting. The pain is at a severity of 10/10. The pain is severe. The pain is The same all the time. Pertinent negatives include no abdominal pain, fever or headaches. She has tried nothing (due to pain being so quick) for the symptoms.     Past Medical History:  Diagnosis Date   Anemia    Elevated liver enzymes 08/17/2018   Gestational hypertension    Seborrheic dermatitis      Family History  Problem Relation Age of Onset   Diabetes Mother    Hypertension Mother    Hypertension Father    Hyperlipidemia Maternal Grandmother      Current Outpatient Medications:    betamethasone dipropionate (DIPROLENE) 0.05 % ointment, Apply to affected areas on scalp once daily., Disp: , Rfl:    Ciclopirox 1 % shampoo, Apply topically., Disp: , Rfl:    Fluocinolone Acetonide Scalp 0.01 %  OIL, Apply topically., Disp: , Rfl:    hydrOXYzine (ATARAX) 10 MG tablet, Take 1 tablet (10 mg total) by mouth 3 (three) times daily as needed., Disp: 30 tablet, Rfl: 0   imiquimod (ALDARA) 5 % cream, SMARTSIG:1 Topical Daily, Disp: , Rfl:    Vitamin D, Ergocalciferol, (DRISDOL) 1.25 MG (50000 UNIT) CAPS capsule, Take 1 capsule (50,000 Units total) by mouth every 7 (seven) days., Disp: 12 capsule, Rfl: 1   mometasone (NASONEX) 50 MCG/ACT nasal spray, PLACE 2 SPRAYS INTO THE NOSE DAILY. (Patient not taking: Reported on 12/21/2022), Disp: 51 each, Rfl: 0   Olopatadine HCl 0.2 % SOLN, Apply 2 drops to eye daily. (Patient not taking: Reported on 12/21/2022), Disp: 2.5 mL, Rfl: 0   Allergies  Allergen Reactions   Bee Pollen Itching    Itching, red eyes. sneezing Itching, red eyes. sneezing   Cat Hair Extract Swelling   Dog Epithelium Swelling   Dust Mite Extract Anaphylaxis   Black Walnut Pollen Allergy Skin Test Swelling   Pecan Extract Itching    Pecan and walnuts makes Mouth itch   Pollen Extract Itching    Itching, red eyes. sneezing     Review of Systems  Constitutional: Negative.  Negative for fever.  Respiratory: Negative.    Cardiovascular: Negative.   Gastrointestinal:  Negative for abdominal pain.  Genitourinary:  Positive for flank pain (mostly on right side has had once on the left.).  Neurological: Negative.  Negative for headaches.  Psychiatric/Behavioral: Negative.       Today's Vitals   01/04/23 0935  BP: 130/78  Pulse: 97  Temp: 97.8 F (36.6 C)  TempSrc: Oral  Weight: 210 lb (95.3 kg)  Height: 5\' 3"  (1.6 m)  PainSc: 0-No pain   Body mass index is 37.2 kg/m.  Wt Readings from Last 3 Encounters:  01/04/23 210 lb (95.3 kg)  12/16/22 210 lb (95.3 kg)  11/25/22 216 lb (98 kg)    Objective:  Physical Exam Vitals reviewed.  Constitutional:      General: She is not in acute distress.    Appearance: Normal appearance.  Cardiovascular:     Rate and Rhythm:  Normal rate and regular rhythm.     Pulses: Normal pulses.     Heart sounds: Normal heart sounds. No murmur heard. Pulmonary:     Effort: Pulmonary effort is normal. No respiratory distress.     Breath sounds: Normal breath sounds. No wheezing.  Abdominal:     Tenderness: There is no right CVA tenderness or left CVA tenderness.  Skin:    Capillary Refill: Capillary refill takes less than 2 seconds.  Neurological:     General: No focal deficit present.     Mental Status: She is alert and oriented to person, place, and time.     Cranial Nerves: No cranial nerve deficit.     Motor: No weakness.  Psychiatric:        Mood and Affect: Mood normal.        Behavior: Behavior normal.        Thought Content: Thought content normal.        Judgment: Judgment normal.         Assessment And Plan:     1. Right flank pain Comments: Small blood and trace leukocytes. If pain worsens return call to office. Low suspicion for kidney stone but will check for UTI. - POCT Urinalysis Dipstick (Automated) - Urine Culture  2. Hematuria, unspecified type - Urine Culture  3. Class 2 obesity due to excess calories without serious comorbidity with body mass index (BMI) of 37.0 to 37.9 in adult She is encouraged to strive for BMI less than 30 to decrease cardiac risk. Advised to aim for at least 150 minutes of exercise per week.  4. COVID-19 vaccination declined Declines covid 19 vaccine. Discussed risk of covid 49 and if she changes her mind about the vaccine to call the office. Education has been provided regarding the importance of this vaccine but patient still declined. Advised may receive this vaccine at local pharmacy or Health Dept.or vaccine clinic. Aware to provide a copy of the vaccination record if obtained from local pharmacy or Health Dept.  Encouraged to take multivitamin, vitamin d, vitamin c and zinc to increase immune system. Aware can call office if would like to have vaccine here at  office. Verbalized acceptance and understanding.   Patient was given opportunity to ask questions. Patient verbalized understanding of the plan and was able to repeat key elements of the plan. All questions were answered to their satisfaction.  Minette Brine, FNP   I, Minette Brine, FNP, have reviewed all documentation for this visit. The documentation on 01/04/23 for the exam, diagnosis, procedures, and orders are all accurate and complete.   IF YOU HAVE BEEN REFERRED TO A  SPECIALIST, IT MAY TAKE 1-2 WEEKS TO SCHEDULE/PROCESS THE REFERRAL. IF YOU HAVE NOT HEARD FROM US/SPECIALIST IN TWO WEEKS, PLEASE GIVE [place] A CALL AT [phone].   THE PATIENT IS ENCOURAGED TO PRACTICE SOCIAL DISTANCING DUE TO THE COVID-19 PANDEMIC.

## 2023-01-04 NOTE — Patient Instructions (Signed)
-   increase fluid intake and can get a strainer to catch urine to see if a stone is present

## 2023-01-05 ENCOUNTER — Encounter: Payer: Self-pay | Admitting: Nurse Practitioner

## 2023-01-06 ENCOUNTER — Other Ambulatory Visit: Payer: Self-pay | Admitting: Nurse Practitioner

## 2023-01-06 DIAGNOSIS — R109 Unspecified abdominal pain: Secondary | ICD-10-CM

## 2023-01-13 DIAGNOSIS — R519 Headache, unspecified: Secondary | ICD-10-CM | POA: Diagnosis not present

## 2023-01-13 DIAGNOSIS — F329 Major depressive disorder, single episode, unspecified: Secondary | ICD-10-CM | POA: Diagnosis not present

## 2023-01-13 DIAGNOSIS — Z91018 Allergy to other foods: Secondary | ICD-10-CM | POA: Diagnosis not present

## 2023-01-13 DIAGNOSIS — Z9109 Other allergy status, other than to drugs and biological substances: Secondary | ICD-10-CM | POA: Diagnosis not present

## 2023-01-13 DIAGNOSIS — R55 Syncope and collapse: Secondary | ICD-10-CM | POA: Diagnosis not present

## 2023-01-13 DIAGNOSIS — Z79899 Other long term (current) drug therapy: Secondary | ICD-10-CM | POA: Diagnosis not present

## 2023-01-13 DIAGNOSIS — Z888 Allergy status to other drugs, medicaments and biological substances status: Secondary | ICD-10-CM | POA: Diagnosis not present

## 2023-01-13 DIAGNOSIS — R42 Dizziness and giddiness: Secondary | ICD-10-CM | POA: Diagnosis not present

## 2023-01-13 DIAGNOSIS — Z87891 Personal history of nicotine dependence: Secondary | ICD-10-CM | POA: Diagnosis not present

## 2023-01-13 DIAGNOSIS — F419 Anxiety disorder, unspecified: Secondary | ICD-10-CM | POA: Diagnosis not present

## 2023-01-14 ENCOUNTER — Ambulatory Visit
Admission: RE | Admit: 2023-01-14 | Discharge: 2023-01-14 | Disposition: A | Discharge status: Home / Self Care | Payer: BC Managed Care – PPO | Source: Ambulatory Visit | Attending: Nurse Practitioner | Admitting: Nurse Practitioner

## 2023-01-14 DIAGNOSIS — R1031 Right lower quadrant pain: Secondary | ICD-10-CM | POA: Diagnosis not present

## 2023-01-14 DIAGNOSIS — R109 Unspecified abdominal pain: Secondary | ICD-10-CM

## 2023-02-10 ENCOUNTER — Ambulatory Visit: Payer: BC Managed Care – PPO | Admitting: Nurse Practitioner

## 2023-03-26 DIAGNOSIS — F411 Generalized anxiety disorder: Secondary | ICD-10-CM | POA: Diagnosis not present

## 2023-03-30 ENCOUNTER — Encounter: Payer: Self-pay | Admitting: Nurse Practitioner

## 2023-03-30 ENCOUNTER — Ambulatory Visit (INDEPENDENT_AMBULATORY_CARE_PROVIDER_SITE_OTHER): Payer: BC Managed Care – PPO | Admitting: Nurse Practitioner

## 2023-03-30 VITALS — BP 120/60 | HR 68 | Temp 98.4°F | Ht 63.0 in | Wt 208.4 lb

## 2023-03-30 DIAGNOSIS — E7800 Pure hypercholesterolemia, unspecified: Secondary | ICD-10-CM | POA: Diagnosis not present

## 2023-03-30 DIAGNOSIS — R7303 Prediabetes: Secondary | ICD-10-CM

## 2023-03-30 DIAGNOSIS — Z0001 Encounter for general adult medical examination with abnormal findings: Secondary | ICD-10-CM | POA: Diagnosis not present

## 2023-03-30 DIAGNOSIS — Z13220 Encounter for screening for lipoid disorders: Secondary | ICD-10-CM | POA: Diagnosis not present

## 2023-03-30 DIAGNOSIS — Z2821 Immunization not carried out because of patient refusal: Secondary | ICD-10-CM

## 2023-03-30 DIAGNOSIS — Z13228 Encounter for screening for other metabolic disorders: Secondary | ICD-10-CM | POA: Diagnosis not present

## 2023-03-30 DIAGNOSIS — Z1231 Encounter for screening mammogram for malignant neoplasm of breast: Secondary | ICD-10-CM

## 2023-03-30 DIAGNOSIS — Z0000 Encounter for general adult medical examination without abnormal findings: Secondary | ICD-10-CM

## 2023-03-30 DIAGNOSIS — E559 Vitamin D deficiency, unspecified: Secondary | ICD-10-CM

## 2023-03-30 DIAGNOSIS — E6609 Other obesity due to excess calories: Secondary | ICD-10-CM

## 2023-03-30 DIAGNOSIS — Z6836 Body mass index (BMI) 36.0-36.9, adult: Secondary | ICD-10-CM

## 2023-03-30 NOTE — Patient Instructions (Signed)

## 2023-03-30 NOTE — Progress Notes (Signed)
Hershal Coria Martin,acting as a Neurosurgeon for Arnette Felts, FNP.,have documented all relevant documentation on the behalf of Arnette Felts, FNP,as directed by  Arnette Felts, FNP while in the presence of Arnette Felts, FNP.   Subjective:     Patient ID: Miranda Shah , female    DOB: 1975/11/02 , 47 y.o.   MRN: [id number]   Chief Complaint  Patient presents with   Annual Exam    HPI  Patient presents today for HM, patient reports compliance with medications and has no other concerns today.   BP Readings from Last 3 Encounters: 03/30/23 : 120/60 01/04/23 : 130/78 12/16/22 : 132/85       Past Medical History:  Diagnosis Date   Anemia    Elevated liver enzymes 08/17/2018   Gestational hypertension    Seborrheic dermatitis      Family History  Problem Relation Age of Onset   Diabetes Mother    Hypertension Mother    Hypertension Father    Hyperlipidemia Maternal Grandmother      Current Outpatient Medications:    betamethasone dipropionate (DIPROLENE) 0.05 % ointment, Apply to affected areas on scalp once daily., Disp: , Rfl:    Ciclopirox 1 % shampoo, Apply topically., Disp: , Rfl:    Fluocinolone Acetonide Scalp 0.01 % OIL, Apply topically., Disp: , Rfl:    hydrOXYzine (ATARAX) 10 MG tablet, Take 1 tablet (10 mg total) by mouth 3 (three) times daily as needed., Disp: 30 tablet, Rfl: 0   imiquimod (ALDARA) 5 % cream, SMARTSIG:1 Topical Daily, Disp: , Rfl:    Vitamin D, Ergocalciferol, (DRISDOL) 1.25 MG (50000 UNIT) CAPS capsule, Take 1 capsule (50,000 Units total) by mouth every 7 (seven) days., Disp: 12 capsule, Rfl: 1   mometasone (NASONEX) 50 MCG/ACT nasal spray, PLACE 2 SPRAYS INTO THE NOSE DAILY. (Patient not taking: Reported on 12/21/2022), Disp: 51 each, Rfl: 0   Olopatadine HCl 0.2 % SOLN, Apply 2 drops to eye daily. (Patient not taking: Reported on 12/21/2022), Disp: 2.5 mL, Rfl: 0   Allergies  Allergen Reactions   Bee Pollen Itching    Itching, red eyes.  sneezing Itching, red eyes. sneezing   Cat Hair Extract Swelling   Dog Epithelium Swelling   Dust Mite Extract Anaphylaxis   Black Walnut Pollen Allergy Skin Test Swelling   Pecan Extract Itching    Pecan and walnuts makes Mouth itch   Pollen Extract Itching    Itching, red eyes. sneezing      The patient states she uses none for birth control.  Patient's last menstrual period was 03/21/2023 (exact date).   Negative for Dysmenorrhea and Negative for Menorrhagia. Negative for: breast discharge, breast lump(s), breast pain and breast self exam. Associated symptoms include abnormal vaginal bleeding. Pertinent negatives include abnormal bleeding (hematology), anxiety, decreased libido, depression, difficulty falling sleep, dyspareunia, history of infertility, nocturia, sexual dysfunction, sleep disturbances, urinary incontinence, urinary urgency, vaginal discharge and vaginal itching. Diet regular; she tries to avoid fried foods. Knows she needs to eat more vegetables. Her mother is living with her at this time. The patient states her exercise level is minimal with 2 days a week consistently on the weekends.   The patient's tobacco use is:  Social History   Tobacco Use  Smoking Status Former   Packs/day: .5   Types: Cigarettes   Quit date: 08/03/2013   Years since quitting: 9.6  Smokeless Tobacco Never  . She has been exposed to passive smoke. The patient's alcohol use  is:  Social History   Substance and Sexual Activity  Alcohol Use Not Currently   Comment: occasional   Additional information: Last pap 08/24/2020, next one scheduled for 08/25/2023.    Review of Systems  Constitutional: Negative.   HENT: Negative.    Eyes: Negative.   Respiratory: Negative.    Cardiovascular: Negative.   Gastrointestinal: Negative.   Endocrine: Negative.   Genitourinary: Negative.   Musculoskeletal: Negative.   Skin: Negative.   Allergic/Immunologic: Negative.   Neurological: Negative.    Hematological: Negative.   Psychiatric/Behavioral: Negative.       Today's Vitals   03/30/23 1056  BP: 120/60  Pulse: 68  Temp: 98.4 F (36.9 C)  TempSrc: Oral  Weight: 208 lb 6.4 oz (94.5 kg)  Height: 5\' 3"  (1.6 m)  PainSc: 0-No pain   Body mass index is 36.92 kg/m.  Wt Readings from Last 3 Encounters:  03/30/23 208 lb 6.4 oz (94.5 kg)  01/04/23 210 lb (95.3 kg)  12/16/22 210 lb (95.3 kg)    Objective:  Physical Exam Vitals reviewed.  Constitutional:      General: She is not in acute distress.    Appearance: Normal appearance. She is well-developed. She is obese.  HENT:     Head: Normocephalic and atraumatic.     Right Ear: Hearing, tympanic membrane, ear canal and external ear normal. There is no impacted cerumen.     Left Ear: Hearing, tympanic membrane, ear canal and external ear normal. There is no impacted cerumen.     Nose: Nose normal.     Mouth/Throat:     Mouth: Mucous membranes are moist.  Eyes:     General: Lids are normal.     Extraocular Movements: Extraocular movements intact.     Conjunctiva/sclera: Conjunctivae normal.     Pupils: Pupils are equal, round, and reactive to light.     Funduscopic exam:    Right eye: No papilledema.        Left eye: No papilledema.  Neck:     Thyroid: No thyroid mass.     Vascular: No carotid bruit.  Cardiovascular:     Rate and Rhythm: Normal rate and regular rhythm.     Pulses: Normal pulses.     Heart sounds: Normal heart sounds. No murmur heard. Pulmonary:     Effort: Pulmonary effort is normal. No respiratory distress.     Breath sounds: Normal breath sounds. No wheezing.  Chest:     Chest wall: No mass.  Breasts:    Tanner Score is 5.     Right: Normal. No mass or tenderness.     Left: Normal. No mass or tenderness.  Abdominal:     General: Abdomen is flat. Bowel sounds are normal. There is no distension.     Palpations: Abdomen is soft.     Tenderness: There is no abdominal tenderness.   Genitourinary:    Rectum: Guaiac result negative.  Musculoskeletal:        General: No swelling. Normal range of motion.     Cervical back: Full passive range of motion without pain, normal range of motion and neck supple.     Right lower leg: No edema.     Left lower leg: No edema.  Lymphadenopathy:     Upper Body:     Right upper body: No supraclavicular, axillary or pectoral adenopathy.     Left upper body: No supraclavicular, axillary or pectoral adenopathy.  Skin:    General: Skin is  warm and dry.     Capillary Refill: Capillary refill takes less than 2 seconds.  Neurological:     General: No focal deficit present.     Mental Status: She is alert and oriented to person, place, and time.     Cranial Nerves: No cranial nerve deficit.     Sensory: No sensory deficit.     Motor: No weakness.  Psychiatric:        Mood and Affect: Mood normal.        Behavior: Behavior normal.        Thought Content: Thought content normal.        Judgment: Judgment normal.         Assessment And Plan:     1. Encounter for annual health examination Behavior modifications discussed and diet history reviewed.   Pt will continue to exercise regularly and modify diet with low GI, plant based foods and decrease intake of processed foods.  Recommend intake of daily multivitamin, Vitamin D, and calcium.  Recommend mammogram for preventive screenings, as well as recommend immunizations that include influenza, TDAP Health Maintenance  Topic Date Due   Mammogram  Never done   COVID-19 Vaccine (4 - 2023-24 season) 07/02/2022   Flu Shot  06/02/2023   Pap Smear  08/25/2023   DTaP/Tdap/Td vaccine (2 - Td or Tdap) 09/23/2031   Hepatitis C Screening  Completed   HIV Screening  Completed   HPV Vaccine  Aged Out   2. Encounter for screening for lipid disorder - Lipid panel  3. Encounter for screening for metabolic disorder - Hemoglobin A1c  4. COVID-19 vaccination declined  5. Encounter for  screening mammogram for breast cancer - MM 3D SCREENING MAMMOGRAM BILATERAL BREAST; Future  6. Class 2 obesity due to excess calories with body mass index (BMI) of 36.0 to 36.9 in adult, unspecified whether serious comorbidity present She is encouraged to strive for BMI less than 30 to decrease cardiac risk. Advised to aim for at least 150 minutes of exercise per week.  7. Elevated LDL cholesterol level Comments: cholesterol levels were elevated, diet controlled. Will check lipid panel.  8. Prediabetes Comments: HgbA1c has been stable. Continue focusing on healthy diet and regular exercise - CBC with Differential/Platelet - CMP14+EGFR - Hemoglobin A1c  9. Vitamin D deficiency Will check vitamin D level and supplement as needed.    Also encouraged to spend 15 minutes in the sun daily.  - Vitamin D (25 hydroxy)   Return for  , 1 year physical, controlled PRE- DM check 4 months. Patient was given opportunity to ask questions. Patient verbalized understanding of the plan and was able to repeat key elements of the plan. All questions were answered to their satisfaction.   Arnette Felts, FNP   I, Arnette Felts, FNP, have reviewed all documentation for this visit. The documentation on 03/30/23 for the exam, diagnosis, procedures, and orders are all accurate and complete.   THE PATIENT IS ENCOURAGED TO PRACTICE SOCIAL DISTANCING DUE TO THE COVID-19 PANDEMIC.

## 2023-03-31 LAB — CMP14+EGFR
ALT: 15 IU/L (ref 0–32)
AST: 20 IU/L (ref 0–40)
Albumin/Globulin Ratio: 1.1 — ABNORMAL LOW (ref 1.2–2.2)
Albumin: 4.3 g/dL (ref 3.9–4.9)
Alkaline Phosphatase: 69 IU/L (ref 44–121)
BUN/Creatinine Ratio: 14 (ref 9–23)
BUN: 12 mg/dL (ref 6–24)
Bilirubin Total: 0.3 mg/dL (ref 0.0–1.2)
CO2: 23 mmol/L (ref 20–29)
Calcium: 9.3 mg/dL (ref 8.7–10.2)
Chloride: 101 mmol/L (ref 96–106)
Creatinine, Ser: 0.85 mg/dL (ref 0.57–1.00)
Globulin, Total: 3.8 g/dL (ref 1.5–4.5)
Glucose: 93 mg/dL (ref 70–99)
Potassium: 3.9 mmol/L (ref 3.5–5.2)
Sodium: 137 mmol/L (ref 134–144)
Total Protein: 8.1 g/dL (ref 6.0–8.5)
eGFR: 87 mL/min/{1.73_m2} (ref >59)

## 2023-03-31 LAB — CBC WITH DIFFERENTIAL/PLATELET
Basophils Absolute: 0 10*3/uL (ref 0.0–0.2)
Basos: 0 %
EOS (ABSOLUTE): 0.4 10*3/uL (ref 0.0–0.4)
Eos: 4 %
Hematocrit: 37.3 % (ref 34.0–46.6)
Hemoglobin: 12.1 g/dL (ref 11.1–15.9)
Immature Grans (Abs): 0 10*3/uL (ref 0.0–0.1)
Immature Granulocytes: 0 %
Lymphocytes Absolute: 2.7 10*3/uL (ref 0.7–3.1)
Lymphs: 30 %
MCH: 27.6 pg (ref 26.6–33.0)
MCHC: 32.4 g/dL (ref 31.5–35.7)
MCV: 85 fL (ref 79–97)
Monocytes Absolute: 0.4 10*3/uL (ref 0.1–0.9)
Monocytes: 5 %
Neutrophils Absolute: 5.5 10*3/uL (ref 1.4–7.0)
Neutrophils: 61 %
Platelets: 299 10*3/uL (ref 150–450)
RBC: 4.39 x10E6/uL (ref 3.77–5.28)
RDW: 13.1 % (ref 11.7–15.4)
WBC: 9 10*3/uL (ref 3.4–10.8)

## 2023-03-31 LAB — LIPID PANEL
Chol/HDL Ratio: 3.1 ratio (ref 0.0–4.4)
Cholesterol, Total: 199 mg/dL (ref 100–199)
HDL: 64 mg/dL (ref >39)
LDL Chol Calc (NIH): 122 mg/dL — ABNORMAL HIGH (ref 0–99)
Triglycerides: 74 mg/dL (ref 0–149)
VLDL Cholesterol Cal: 13 mg/dL (ref 5–40)

## 2023-03-31 LAB — HEMOGLOBIN A1C
Est. average glucose Bld gHb Est-mCnc: 123 mg/dL
Hgb A1c MFr Bld: 5.9 % — ABNORMAL HIGH (ref 4.8–5.6)

## 2023-03-31 LAB — VITAMIN D 25 HYDROXY (VIT D DEFICIENCY, FRACTURES): Vit D, 25-Hydroxy: 38.4 ng/mL (ref 30.0–100.0)

## 2023-04-23 DIAGNOSIS — F411 Generalized anxiety disorder: Secondary | ICD-10-CM | POA: Diagnosis not present

## 2023-04-26 ENCOUNTER — Ambulatory Visit: Payer: BC Managed Care – PPO

## 2023-04-28 ENCOUNTER — Ambulatory Visit
Admission: RE | Admit: 2023-04-28 | Discharge: 2023-04-28 | Disposition: A | Discharge status: Home / Self Care | Payer: BC Managed Care – PPO | Source: Ambulatory Visit | Attending: Nurse Practitioner | Admitting: Nurse Practitioner

## 2023-04-28 DIAGNOSIS — Z1231 Encounter for screening mammogram for malignant neoplasm of breast: Secondary | ICD-10-CM | POA: Diagnosis not present

## 2023-08-02 ENCOUNTER — Ambulatory Visit: Payer: BC Managed Care – PPO | Admitting: Nurse Practitioner

## 2023-08-02 NOTE — Progress Notes (Deleted)
Madelaine Bhat, CMA,acting as a Neurosurgeon for Arnette Felts, FNP.,have documented all relevant documentation on the behalf of Arnette Felts, FNP,as directed by  Arnette Felts, FNP while in the presence of Arnette Felts, FNP.  Subjective:  Patient ID: Miranda Shah , female    DOB: 10-24-1976 , 47 y.o.   MRN: [id number]  No chief complaint on file.   HPI  Patient presents today for a pre dm follow up, Patient reports compliance with medication. Patient denies any chest pain, SOB, or headaches. Patient has no concerns today.     Past Medical History:  Diagnosis Date  . Anemia   . Elevated liver enzymes 08/17/2018  . Gestational hypertension   . Seborrheic dermatitis      Family History  Problem Relation Age of Onset  . Diabetes Mother   . Hypertension Mother   . Hypertension Father   . Hyperlipidemia Maternal Grandmother      Current Outpatient Medications:  .  betamethasone dipropionate (DIPROLENE) 0.05 % ointment, Apply to affected areas on scalp once daily., Disp: , Rfl:  .  Ciclopirox 1 % shampoo, Apply topically., Disp: , Rfl:  .  Fluocinolone Acetonide Scalp 0.01 % OIL, Apply topically., Disp: , Rfl:  .  hydrOXYzine (ATARAX) 10 MG tablet, Take 1 tablet (10 mg total) by mouth 3 (three) times daily as needed., Disp: 30 tablet, Rfl: 0 .  imiquimod (ALDARA) 5 % cream, SMARTSIG:1 Topical Daily, Disp: , Rfl:  .  mometasone (NASONEX) 50 MCG/ACT nasal spray, PLACE 2 SPRAYS INTO THE NOSE DAILY. (Patient not taking: Reported on 12/21/2022), Disp: 51 each, Rfl: 0 .  Olopatadine HCl 0.2 % SOLN, Apply 2 drops to eye daily. (Patient not taking: Reported on 12/21/2022), Disp: 2.5 mL, Rfl: 0 .  Vitamin D, Ergocalciferol, (DRISDOL) 1.25 MG (50000 UNIT) CAPS capsule, Take 1 capsule (50,000 Units total) by mouth every 7 (seven) days., Disp: 12 capsule, Rfl: 1   Allergies  Allergen Reactions  . Bee Pollen Itching    Itching, red eyes. sneezing Itching, red eyes. sneezing  . Cat Hair Extract  Swelling  . Dog Epithelium Swelling  . Dust Mite Extract Anaphylaxis  . Black Walnut Pollen Allergy Skin Test Swelling  . Pecan Extract Itching    Pecan and walnuts makes Mouth itch  . Pollen Extract Itching    Itching, red eyes. sneezing     Review of Systems  Constitutional: Negative.   HENT: Negative.    Eyes: Negative.   Respiratory: Negative.    Cardiovascular: Negative.   Gastrointestinal: Negative.     There were no vitals filed for this visit. There is no height or weight on file to calculate BMI.  Wt Readings from Last 3 Encounters:  03/30/23 208 lb 6.4 oz (94.5 kg)  01/04/23 210 lb (95.3 kg)  12/16/22 210 lb (95.3 kg)    The 10-year ASCVD risk score (Arnett DK, et al., 2019) is: 0.5%   Values used to calculate the score:     Age: 10 years     Sex: Female     Is Non-Hispanic African American: Yes     Diabetic: No     Tobacco smoker: No     Systolic Blood Pressure: 120 mmHg     Is BP treated: No     HDL Cholesterol: 64 mg/dL     Total Cholesterol: 199 mg/dL  Objective:  Physical Exam      Assessment And Plan:  Prediabetes  Elevated LDL cholesterol level  Elevated blood pressure reading without diagnosis of hypertension    No follow-ups on file.  Patient was given opportunity to ask questions. Patient verbalized understanding of the plan and was able to repeat key elements of the plan. All questions were answered to their satisfaction.    Jeanell Sparrow, FNP, have reviewed all documentation for this visit. The documentation on 08/02/23 for the exam, diagnosis, procedures, and orders are all accurate and complete.   IF YOU HAVE BEEN REFERRED TO A SPECIALIST, IT MAY TAKE 1-2 WEEKS TO SCHEDULE/PROCESS THE REFERRAL. IF YOU HAVE NOT HEARD FROM US/SPECIALIST IN TWO WEEKS, PLEASE GIVE [place] A CALL AT [phone] X [phone].

## 2023-09-15 DIAGNOSIS — L308 Other specified dermatitis: Secondary | ICD-10-CM | POA: Diagnosis not present

## 2023-09-15 DIAGNOSIS — L219 Seborrheic dermatitis, unspecified: Secondary | ICD-10-CM | POA: Diagnosis not present

## 2023-09-15 DIAGNOSIS — L659 Nonscarring hair loss, unspecified: Secondary | ICD-10-CM | POA: Diagnosis not present

## 2023-09-23 DIAGNOSIS — Z87891 Personal history of nicotine dependence: Secondary | ICD-10-CM | POA: Diagnosis not present

## 2023-09-23 DIAGNOSIS — R030 Elevated blood-pressure reading, without diagnosis of hypertension: Secondary | ICD-10-CM | POA: Diagnosis not present

## 2023-09-23 DIAGNOSIS — Z9049 Acquired absence of other specified parts of digestive tract: Secondary | ICD-10-CM | POA: Diagnosis not present

## 2023-09-23 DIAGNOSIS — R42 Dizziness and giddiness: Secondary | ICD-10-CM | POA: Diagnosis not present

## 2023-09-25 ENCOUNTER — Other Ambulatory Visit: Payer: Self-pay

## 2023-09-25 ENCOUNTER — Emergency Department (HOSPITAL_BASED_OUTPATIENT_CLINIC_OR_DEPARTMENT_OTHER)
Admission: EM | Admit: 2023-09-25 | Discharge: 2023-09-25 | Disposition: A | Discharge status: Home / Self Care | Payer: BC Managed Care – PPO | Attending: Emergency Medicine | Admitting: Emergency Medicine

## 2023-09-25 ENCOUNTER — Encounter (HOSPITAL_BASED_OUTPATIENT_CLINIC_OR_DEPARTMENT_OTHER): Payer: Self-pay | Admitting: Emergency Medicine

## 2023-09-25 DIAGNOSIS — Z87891 Personal history of nicotine dependence: Secondary | ICD-10-CM | POA: Diagnosis not present

## 2023-09-25 DIAGNOSIS — R42 Dizziness and giddiness: Secondary | ICD-10-CM | POA: Insufficient documentation

## 2023-09-25 LAB — CBC WITH DIFFERENTIAL/PLATELET
Abs Immature Granulocytes: 0.04 10*3/uL (ref 0.00–0.07)
Basophils Absolute: 0 10*3/uL (ref 0.0–0.1)
Basophils Relative: 0 %
Eosinophils Absolute: 0.3 10*3/uL (ref 0.0–0.5)
Eosinophils Relative: 2 %
HCT: 35.4 % — ABNORMAL LOW (ref 36.0–46.0)
Hemoglobin: 11.7 g/dL — ABNORMAL LOW (ref 12.0–15.0)
Immature Granulocytes: 0 %
Lymphocytes Relative: 20 %
Lymphs Abs: 2.5 10*3/uL (ref 0.7–4.0)
MCH: 28.2 pg (ref 26.0–34.0)
MCHC: 33.1 g/dL (ref 30.0–36.0)
MCV: 85.3 fL (ref 80.0–100.0)
Monocytes Absolute: 0.6 10*3/uL (ref 0.1–1.0)
Monocytes Relative: 4 %
Neutro Abs: 9.3 10*3/uL — ABNORMAL HIGH (ref 1.7–7.7)
Neutrophils Relative %: 74 %
Platelets: 297 10*3/uL (ref 150–400)
RBC: 4.15 MIL/uL (ref 3.87–5.11)
RDW: 13.2 % (ref 11.5–15.5)
WBC: 12.7 10*3/uL — ABNORMAL HIGH (ref 4.0–10.5)
nRBC: 0 % (ref 0.0–0.2)

## 2023-09-25 LAB — COMPREHENSIVE METABOLIC PANEL
ALT: 18 U/L (ref 0–44)
AST: 22 U/L (ref 15–41)
Albumin: 3.9 g/dL (ref 3.5–5.0)
Alkaline Phosphatase: 57 U/L (ref 38–126)
Anion gap: 14 (ref 5–15)
BUN: 12 mg/dL (ref 6–20)
CO2: 24 mmol/L (ref 22–32)
Calcium: 9.5 mg/dL (ref 8.9–10.3)
Chloride: 102 mmol/L (ref 98–111)
Creatinine, Ser: 0.91 mg/dL (ref 0.44–1.00)
GFR, Estimated: >60 mL/min (ref >60)
Glucose, Bld: 102 mg/dL — ABNORMAL HIGH (ref 70–99)
Potassium: 4 mmol/L (ref 3.5–5.1)
Sodium: 140 mmol/L (ref 135–145)
Total Bilirubin: 0.4 mg/dL (ref <1.2)
Total Protein: 8.4 g/dL — ABNORMAL HIGH (ref 6.5–8.1)

## 2023-09-25 LAB — URINALYSIS, MICROSCOPIC (REFLEX): RBC / HPF: NONE SEEN RBC/hpf (ref 0–5)

## 2023-09-25 LAB — PREGNANCY, URINE: Preg Test, Ur: NEGATIVE

## 2023-09-25 MED ORDER — NITROFURANTOIN MONOHYD MACRO 100 MG PO CAPS: 100.0000 mg | ORAL_CAPSULE | Freq: Two times a day (BID) | ORAL | 0 refills | Status: DC

## 2023-09-25 NOTE — ED Notes (Signed)
Gold, Blue & Dk Green tubes sent to lab to hold

## 2023-09-25 NOTE — ED Triage Notes (Signed)
Pt reports continued lightheadedness and "woozy" feeling; was evaluated Fri for same

## 2023-09-25 NOTE — ED Notes (Signed)
Attempted IV access to RAC and butterfly stick to right hand, tol well but unsuccessful

## 2023-09-25 NOTE — Discharge Instructions (Signed)
We evaluated you for your lightheadedness.  Your laboratory testing was reassuring.  Your urinalysis did have some bacteria in it.  Although you are not really having any urine symptoms, given your lightheadedness we have decided to treat you for a bladder infection.  We have sent antibiotics for you.  Please take these as prescribed.  Your symptoms could also be due to orthostasis, which is a type of lightheadedness that happens when you are standing for a Petrovich period or when you stand up suddenly.  This is not a dangerous condition.  Please increase your fluid intake to see if this helps with your lightheadedness.  Please follow-up closely with your primary doctor.  If you have any new or worsening symptoms such as fainting, severe dizziness, vomiting, chest pain, shortness of breath, fevers, or any other concerning symptoms, please return to the emergency department.

## 2023-09-25 NOTE — ED Notes (Signed)
Attempt IV placement in LAC without success.  Pt ambulated to the bathroom to void.

## 2023-09-25 NOTE — ED Notes (Signed)
ED Provider at bedside. 

## 2023-09-25 NOTE — ED Notes (Signed)
Pt is here for evaluation of "feeling off"  she states that this feeling is intermittent, no focal weakness or numbness.  Pt tells me that "this is the best I have felt all day" at this time.  Warm blanket given for comfort.

## 2023-09-25 NOTE — ED Provider Notes (Signed)
[place] [facility] AT [facility] Provider Note  CSN: [id number] Arrival date & time: 09/25/23 1414  Chief Complaint(s) Dizziness  HPI Miranda Shah is a 47 y.o. female without significant past medical history presenting to the emergency department with lightheadedness.  Patient reports episodic lightheadedness over the past week.  Does seem to be worse when standing up or standing.  She reports that overall otherwise she feels okay.  She denies any specific other symptoms such as fevers or chills, dysuria, abdominal pain, chest pain, difficulty breathing, syncope, headaches, visual disturbance, cough, or any other symptoms.  She went to an outside emergency department 2 days ago for labs were obtained and were reassuring.  They advised her to check her blood pressure, she has been doing this at home and had a couple of lower readings although these readings are when she was feeling normal and not lightheaded.  She wanted to come and get rechecked to make sure there is nothing else going on.   Past Medical History Past Medical History:  Diagnosis Date   Anemia    Elevated liver enzymes 08/17/2018   Gestational hypertension    Seborrheic dermatitis    Patient Active Problem List   Diagnosis Date Noted   Elevated LDL cholesterol level 03/30/2023   Prediabetes 03/30/2023   Vitamin D deficiency 03/30/2023   Right flank pain 01/04/2023   Viral warts 06/16/2022   Irritant dermatitis 06/09/2020   Seborrheic dermatitis of scalp 06/09/2020   Seborrheic keratosis 06/09/2020   Seborrheic dermatitis 06/09/2020   Preeclampsia in postpartum period 09/14/2018   Postpartum anemia 09/09/2018   Preeclampsia, severe, third trimester 09/08/2018   Gallstone 08/25/2018   Postpartum hypertension 08/16/2018   Class 2 obesity due to excess calories with body mass index (BMI) of 36.0 to 36.9 in adult 08/03/2018   Anemia 05/08/2018   Leukocytosis 04/05/2018   Maternal age 9+,  multigravida, antepartum 04/04/2018   Rectal bleeding 12/30/2013   Home Medication(s) Prior to Admission medications   Medication Sig Start Date End Date Taking? Authorizing Provider  nitrofurantoin, macrocrystal-monohydrate, (MACROBID) 100 MG capsule Take 1 capsule (100 mg total) by mouth 2 (two) times daily. 09/25/23  Yes Lonell Grandchild, MD  betamethasone dipropionate (DIPROLENE) 0.05 % ointment Apply to affected areas on scalp once daily. 06/16/22   [provider]  Ciclopirox 1 % shampoo Apply topically. 06/17/22   [provider]  Fluocinolone Acetonide Scalp 0.01 % OIL Apply topically. 06/16/22   [provider]  hydrOXYzine (ATARAX) 10 MG tablet Take 1 tablet (10 mg total) by mouth 3 (three) times daily as needed. 06/22/22   Arnette Felts, FNP  imiquimod Mathis Dad) 5 % cream SMARTSIG:1 Topical Daily 06/16/22   [provider]  mometasone (NASONEX) 50 MCG/ACT nasal spray PLACE 2 SPRAYS INTO THE NOSE DAILY. Patient not taking: Reported on 12/21/2022 08/23/22 08/23/23  Arnette Felts, FNP  Olopatadine HCl 0.2 % SOLN Apply 2 drops to eye daily. Patient not taking: Reported on 12/21/2022 05/27/22   Arnette Felts, FNP  Vitamin D, Ergocalciferol, (DRISDOL) 1.25 MG (50000 UNIT) CAPS capsule Take 1 capsule (50,000 Units total) by mouth every 7 (seven) days. 11/28/22   Arnette Felts, FNP  Past Surgical History Past Surgical History:  Procedure Laterality Date   CESAREAN SECTION N/A 09/08/2018   Procedure: CESAREAN SECTION;  Surgeon: Essie Hart, MD;  Location: [facility];  Service: Obstetrics;  Laterality: N/A;   CHOLECYSTECTOMY N/A 01/22/2022   Procedure: LAPAROSCOPIC CHOLECYSTECTOMY;  Surgeon: Fritzi Mandes, MD;  Location: [facility] OR;  Service: General;  Laterality: N/A;   Family History Family History  Problem Relation Age of  Onset   Diabetes Mother    Hypertension Mother    Hypertension Father    Hyperlipidemia Maternal Grandmother     Social History Social History   Tobacco Use   Smoking status: Former    Current packs/day: 0.00    Types: Cigarettes    Quit date: 08/03/2013    Years since quitting: 10.1   Smokeless tobacco: Never  Vaping Use   Vaping status: Never Used  Substance Use Topics   Alcohol use: Not Currently    Comment: occasional   Drug use: Not Currently    Types: Marijuana    Comment: none since college   Allergies Bee pollen, Cat hair extract, Dog epithelium, Dust mite extract, Black walnut pollen allergy skin test, Pecan extract, and Pollen extract  Review of Systems Review of Systems  All other systems reviewed and are negative.   Physical Exam Vital Signs  I have reviewed the triage vital signs BP 124/77   Pulse 83   Temp 98.3 F (36.8 C) (Oral)   Resp 14   Ht 5\' 3"  (1.6 m)   Wt 93.4 kg   SpO2 100%   BMI 36.49 kg/m  Physical Exam Vitals and nursing note reviewed.  Constitutional:      General: She is not in acute distress.    Appearance: She is well-developed.  HENT:     Head: Normocephalic and atraumatic.     Mouth/Throat:     Mouth: Mucous membranes are moist.  Eyes:     Pupils: Pupils are equal, round, and reactive to light.  Cardiovascular:     Rate and Rhythm: Normal rate and regular rhythm.     Heart sounds: No murmur heard. Pulmonary:     Effort: Pulmonary effort is normal. No respiratory distress.     Breath sounds: Normal breath sounds.  Abdominal:     General: Abdomen is flat.     Palpations: Abdomen is soft.     Tenderness: There is no abdominal tenderness.  Musculoskeletal:        General: No tenderness.     Right lower leg: No edema.     Left lower leg: No edema.  Skin:    General: Skin is warm and dry.  Neurological:     General: No focal deficit present.     Mental Status: She is alert. Mental status is at baseline.   Psychiatric:        Mood and Affect: Mood normal.        Behavior: Behavior normal.     ED Results and Treatments Labs (all labs ordered are listed, but only abnormal results are displayed) Labs Reviewed  CBC WITH DIFFERENTIAL/PLATELET - Abnormal; Notable for the following components:      Result Value   WBC 12.7 (*)    Hemoglobin 11.7 (*)    HCT 35.4 (*)    Neutro Abs 9.3 (*)    All other components within normal limits  COMPREHENSIVE METABOLIC PANEL - Abnormal; Notable for the following components:   Glucose, Bld 102 (*)  Total Protein 8.4 (*)    All other components within normal limits  URINALYSIS, ROUTINE W REFLEX MICROSCOPIC - Abnormal; Notable for the following components:   Hgb urine dipstick TRACE (*)    Leukocytes,Ua SMALL (*)    All other components within normal limits  URINALYSIS, MICROSCOPIC (REFLEX) - Abnormal; Notable for the following components:   Bacteria, UA RARE (*)    All other components within normal limits  PREGNANCY, URINE                                                                                                                          Radiology No results found.  Pertinent labs & imaging results that were available during my care of the patient were reviewed by me and considered in my medical decision making (see MDM for details).  Medications Ordered in ED Medications - No data to display                                                                                                                                   Procedures Procedures  (including critical care time)  Medical Decision Making / ED Course   MDM:  47 year old female presenting to the emergency department with episodic lightheadedness.  Patient well-appearing, physical examination unremarkable.  Low concern for dangerous cause of lightheadedness.  Symptoms seem most likely orthostatic in nature as they are worse with standing.  No syncope, palpitations, dyspnea,  chest pain to suggest cardiac cause.  No fevers or chills or infectious symptoms to suggest underlying infectious process.  Patient does not appear dehydrated.  Had labs checked recently with no anemia.  Vital signs are normal with mild hypertension.  Will check basic labs, urinalysis, pregnancy, EKG.  Patient is overall quite well-appearing.  If her labs and workup is reassuring, anticipate discharge with outpatient follow-up.  Clinical Course as of 09/25/23 1732  Sun Sep 25, 2023  1731 Urinalysis with small amount of bacteria.  She has mild leukocytosis.  She is not really having any urinary symptoms, given lightheadedness without really any other clear symptoms discussed with the patient regarding culture versus empiric treatment and she would like to try empiric treatment in case this could be related.  Will treat with Macrobid. Will discharge patient to home. All questions answered. Patient comfortable with plan of discharge. Return precautions discussed with patient and specified on the after visit summary.  [  WS]    Clinical Course User Index [WS] Lonell Grandchild, MD     Additional history obtained:  -External records from outside source obtained and reviewed including: Chart review including previous notes, labs, imaging, consultation notes including [facility] records    Lab Tests: -I ordered, reviewed, and interpreted labs.   The pertinent results include:   Labs Reviewed  CBC WITH DIFFERENTIAL/PLATELET - Abnormal; Notable for the following components:      Result Value   WBC 12.7 (*)    Hemoglobin 11.7 (*)    HCT 35.4 (*)    Neutro Abs 9.3 (*)    All other components within normal limits  COMPREHENSIVE METABOLIC PANEL - Abnormal; Notable for the following components:   Glucose, Bld 102 (*)    Total Protein 8.4 (*)    All other components within normal limits  URINALYSIS, ROUTINE W REFLEX MICROSCOPIC - Abnormal; Notable for the following components:   Hgb urine dipstick  TRACE (*)    Leukocytes,Ua SMALL (*)    All other components within normal limits  URINALYSIS, MICROSCOPIC (REFLEX) - Abnormal; Notable for the following components:   Bacteria, UA RARE (*)    All other components within normal limits  PREGNANCY, URINE    Notable for nonspecific mild leukocytosis, bacturia, no AKI   EKG   EKG Interpretation Date/Time:  Sunday September 25 2023 14:33:56 EST Ventricular Rate:  77 PR Interval:  147 QRS Duration:  94 QT Interval:  363 QTC Calculation: 411 R Axis:   60  Text Interpretation: Sinus rhythm Low voltage, precordial leads Confirmed by Alvino Blood ([id number]) on 09/25/2023 3:32:23 PM          Medicines ordered and prescription drug management: Meds ordered this encounter  Medications   nitrofurantoin, macrocrystal-monohydrate, (MACROBID) 100 MG capsule    Sig: Take 1 capsule (100 mg total) by mouth 2 (two) times daily.    Dispense:  10 capsule    Refill:  0    -I have reviewed the patients home medicines and have made adjustments as needed   Social Determinants of Health:  Diagnosis or treatment significantly limited by social determinants of health: obesity    Co morbidities that complicate the patient evaluation  Past Medical History:  Diagnosis Date   Anemia    Elevated liver enzymes 08/17/2018   Gestational hypertension    Seborrheic dermatitis       Dispostion: Disposition decision including need for hospitalization was considered, and patient discharged from emergency department.    Final Clinical Impression(s) / ED Diagnoses Final diagnoses:  Lightheadedness     This chart was dictated using voice recognition software.  Despite best efforts to proofread,  errors can occur which can change the documentation meaning.    Lonell Grandchild, MD 09/25/23 [phone]

## 2023-09-26 ENCOUNTER — Telehealth: Payer: Self-pay

## 2023-09-26 NOTE — Transitions of Care (Post Inpatient/ED Visit) (Signed)
   09/26/2023  Name: Miranda Shah MRN: [id number] DOB: 25-Oct-1976  Today's TOC FU Call Status: Today's TOC FU Call Status:: Successful TOC FU Call Completed TOC FU Call Complete Date: 09/26/23 Patient's Name and Date of Birth confirmed.  Transition Care Management Follow-up Telephone Call Date of Discharge: 09/25/23 Discharge Facility: [facility] Type of Discharge: Inpatient Admission Primary Inpatient Discharge Diagnosis:: LIGHTHEADNESS How have you been since you were released from the hospital?: Better Any questions or concerns?: No  Items Reviewed: Did you receive and understand the discharge instructions provided?: Yes Medications obtained,verified, and reconciled?: Yes (Medications Reviewed) Any new allergies since your discharge?: No Dietary orders reviewed?: No Do you have support at home?: Yes People in Home: spouse  Medications Reviewed Today: Medications Reviewed Today   Medications were not reviewed in this encounter     Home Care and Equipment/Supplies: Were Home Health Services Ordered?: No Any new equipment or medical supplies ordered?: No  Functional Questionnaire: Do you need assistance with bathing/showering or dressing?: No Do you need assistance with meal preparation?: No Do you need assistance with eating?: No Do you have difficulty maintaining continence: No Do you need assistance with getting out of bed/getting out of a chair/moving?: No Do you have difficulty managing or taking your medications?: No  Follow up appointments reviewed: PCP Follow-up appointment confirmed?: Yes Date of PCP follow-up appointment?: 09/27/23 Follow-up Provider: [facility] Specialist Hospital Follow-up appointment confirmed?: No Reason Specialist Follow-Up Not Confirmed: Appointment Sceduled by [facility] Calling Clinician Do you need transportation to your follow-up appointment?: No Do you understand care options if your condition(s) worsen?: Yes-patient verbalized  understanding    SIGNATURE Lisabeth Devoid, CMA

## 2023-09-26 NOTE — Transitions of Care (Post Inpatient/ED Visit) (Signed)
   09/26/2023  Name: Miranda Shah MRN: [id number] DOB: 30-Oct-1976  Today's TOC FU Call Status: Today's TOC FU Call Status:: Unsuccessful Call (1st Attempt) Unsuccessful Call (1st Attempt) Date: 09/26/23  Attempted to reach the patient regarding the most recent Inpatient/ED visit.  Follow Up Plan: Additional outreach attempts will be made to reach the patient to complete the Transitions of Care (Post Inpatient/ED visit) call.   Signature Lisabeth Devoid, [place]

## 2023-09-27 ENCOUNTER — Ambulatory Visit: Payer: Self-pay | Admitting: Family Medicine

## 2023-09-27 ENCOUNTER — Encounter: Payer: Self-pay | Admitting: Family Medicine

## 2023-09-27 VITALS — BP 124/76 | HR 86 | Temp 98.1°F | Ht 63.0 in | Wt 206.0 lb

## 2023-09-27 DIAGNOSIS — R42 Dizziness and giddiness: Secondary | ICD-10-CM | POA: Diagnosis not present

## 2023-09-27 DIAGNOSIS — R82998 Other abnormal findings in urine: Secondary | ICD-10-CM

## 2023-09-27 NOTE — Progress Notes (Signed)
I,Jameka J Llittleton, CMA,acting as a Neurosurgeon for [employer], NP.,have documented all relevant documentation on the behalf of Ellender Hose, NP,as directed by  Ellender Hose, NP while in the presence of Ellender Hose, NP.  Subjective:  Patient ID: Miranda Shah , female    DOB: 1976-04-20 , 47 y.o.   MRN: [id number]  Chief Complaint  Patient presents with   HOSPITAL F/U    HPI  Patient is a 47 year old female who presents today for a ER follow up. She went to the ER on 11/22 and 09/25/23 for complaints of lightheadedness, she states her BP was ranging 120s, Patient reports she feels a little bit better than she has felt for the past couple of days. Patient was treated with Macrobid for 5 days for Leukocytes in her urine , she states she has 6 tablets left.   Patient advised to stay hydrated , eat regularly and get a BP monitor to check her BP regularly. Patient admits that she has been skipping meals unintentionally.Patient voiced understanding and had no concerns or questions at this time.     Past Medical History:  Diagnosis Date   Anemia    Elevated liver enzymes 08/17/2018   Gestational hypertension    Seborrheic dermatitis      Family History  Problem Relation Age of Onset   Diabetes Mother    Hypertension Mother    Hypertension Father    Hyperlipidemia Maternal Grandmother      Current Outpatient Medications:    mometasone (NASONEX) 50 MCG/ACT nasal spray, PLACE 2 SPRAYS INTO THE NOSE DAILY., Disp: 51 each, Rfl: 0   nitrofurantoin, macrocrystal-monohydrate, (MACROBID) 100 MG capsule, Take 1 capsule (100 mg total) by mouth 2 (two) times daily., Disp: 10 capsule, Rfl: 0   ZORYVE 0.3 % FOAM, Apply 1 Application topically daily at 6 (six) AM., Disp: , Rfl:    Fluocinolone Acetonide Scalp 0.01 % OIL, Apply topically. (Patient not taking: Reported on 09/27/2023), Disp: , Rfl:    Vitamin D, Ergocalciferol, (DRISDOL) 1.25 MG (50000 UNIT) CAPS capsule, Take 1 capsule (50,000 Units  total) by mouth every 7 (seven) days. (Patient not taking: Reported on 09/27/2023), Disp: 12 capsule, Rfl: 1   Allergies  Allergen Reactions   Bee Pollen Itching    Itching, red eyes. sneezing Itching, red eyes. sneezing   Cat Hair Extract Swelling   Dog Epithelium Swelling   Dust Mite Extract Anaphylaxis   Black Walnut Pollen Allergy Skin Test Swelling   Pecan Extract Itching    Pecan and walnuts makes Mouth itch   Pollen Extract Itching    Itching, red eyes. sneezing     Review of Systems  Constitutional: Negative.   HENT: Negative.    Eyes: Negative.   Respiratory: Negative.    Cardiovascular: Negative.   Endocrine: Negative.   Genitourinary: Negative.   Musculoskeletal: Negative.   Skin: Negative.   Neurological:  Positive for light-headedness.     Today's Vitals   09/27/23 1524 09/27/23 1553  BP: 130/82 124/76  Pulse: 86   Temp: 98.1 F (36.7 C)   Weight: 206 lb (93.4 kg)   Height: 5\' 3"  (1.6 m)   PainSc: 0-No pain    Body mass index is 36.49 kg/m.  Wt Readings from Last 3 Encounters:  09/27/23 206 lb (93.4 kg)  09/25/23 206 lb (93.4 kg)  03/30/23 208 lb 6.4 oz (94.5 kg)    The 10-year ASCVD risk score (Arnett DK, et al., 2019) is: 0.6%  Values used to calculate the score:     Age: 24 years     Sex: Female     Is Non-Hispanic African American: Yes     Diabetic: No     Tobacco smoker: No     Systolic Blood Pressure: 124 mmHg     Is BP treated: No     HDL Cholesterol: 64 mg/dL     Total Cholesterol: 199 mg/dL  Objective:  Physical Exam HENT:     Head: Normocephalic.  Cardiovascular:     Rate and Rhythm: Normal rate.  Pulmonary:     Effort: Pulmonary effort is normal.  Abdominal:     General: Bowel sounds are normal.  Skin:    General: Skin is warm and dry.  Neurological:     General: No focal deficit present.     Mental Status: She is alert and oriented to person, place, and time.  Psychiatric:        Mood and Affect: Mood normal.          Assessment And Plan:  Lightheadedness Assessment & Plan: improving   Leukocytes in urine Assessment & Plan: On Macrobid 100 mg BID x 5 days.     Return if symptoms worsen or fail to improve, for physical from 03/30/24 with PCP.  Patient was given opportunity to ask questions. Patient verbalized understanding of the plan and was able to repeat key elements of the plan. All questions were answered to their satisfaction.    I, Ellender Hose, NP, have reviewed all documentation for this visit. The documentation on 10/01/23 for the exam, diagnosis, procedures, and orders are all accurate and complete.   IF YOU HAVE BEEN REFERRED TO A SPECIALIST, IT MAY TAKE 1-2 WEEKS TO SCHEDULE/PROCESS THE REFERRAL. IF YOU HAVE NOT HEARD FROM US/SPECIALIST IN TWO WEEKS, PLEASE GIVE [place] A CALL AT [phone] X [phone].

## 2023-09-29 ENCOUNTER — Encounter: Payer: Self-pay | Admitting: Nurse Practitioner

## 2023-10-01 DIAGNOSIS — R82998 Other abnormal findings in urine: Secondary | ICD-10-CM | POA: Insufficient documentation

## 2023-10-01 DIAGNOSIS — R42 Dizziness and giddiness: Secondary | ICD-10-CM | POA: Insufficient documentation

## 2023-10-01 NOTE — Assessment & Plan Note (Signed)
improving

## 2023-10-01 NOTE — Assessment & Plan Note (Signed)
On Macrobid 100 mg BID x 5 days.

## 2023-10-03 ENCOUNTER — Other Ambulatory Visit: Payer: Self-pay | Admitting: Nurse Practitioner

## 2023-10-05 LAB — URINALYSIS, ROUTINE W REFLEX MICROSCOPIC
Bilirubin Urine: NEGATIVE
Glucose, UA: NEGATIVE mg/dL
Ketones, ur: NEGATIVE mg/dL
Nitrite: NEGATIVE
Protein, ur: NEGATIVE mg/dL
Specific Gravity, Urine: 1.02 (ref 1.005–1.030)
pH: 6.5 (ref 5.0–8.0)

## 2023-11-25 DIAGNOSIS — R030 Elevated blood-pressure reading, without diagnosis of hypertension: Secondary | ICD-10-CM | POA: Diagnosis not present

## 2023-11-25 DIAGNOSIS — R002 Palpitations: Secondary | ICD-10-CM | POA: Diagnosis not present

## 2023-11-25 DIAGNOSIS — E785 Hyperlipidemia, unspecified: Secondary | ICD-10-CM | POA: Diagnosis not present

## 2023-12-06 DIAGNOSIS — E669 Obesity, unspecified: Secondary | ICD-10-CM | POA: Diagnosis not present

## 2023-12-06 DIAGNOSIS — Z1331 Encounter for screening for depression: Secondary | ICD-10-CM | POA: Diagnosis not present

## 2023-12-15 DIAGNOSIS — R002 Palpitations: Secondary | ICD-10-CM | POA: Diagnosis not present

## 2023-12-16 DIAGNOSIS — E669 Obesity, unspecified: Secondary | ICD-10-CM | POA: Diagnosis not present

## 2023-12-16 DIAGNOSIS — Z713 Dietary counseling and surveillance: Secondary | ICD-10-CM | POA: Diagnosis not present

## 2023-12-20 DIAGNOSIS — F54 Psychological and behavioral factors associated with disorders or diseases classified elsewhere: Secondary | ICD-10-CM | POA: Diagnosis not present

## 2023-12-20 DIAGNOSIS — E669 Obesity, unspecified: Secondary | ICD-10-CM | POA: Diagnosis not present

## 2024-02-10 DIAGNOSIS — R42 Dizziness and giddiness: Secondary | ICD-10-CM | POA: Diagnosis not present

## 2024-02-10 DIAGNOSIS — R110 Nausea: Secondary | ICD-10-CM | POA: Diagnosis not present

## 2024-02-10 DIAGNOSIS — Z87891 Personal history of nicotine dependence: Secondary | ICD-10-CM | POA: Diagnosis not present

## 2024-04-18 ENCOUNTER — Encounter: Payer: BC Managed Care – PPO | Admitting: Nurse Practitioner

## 2024-04-18 NOTE — Progress Notes (Deleted)
 [place]avia, CMA,acting as a Neurosurgeon for Miranda Epley, FNP.,have documented all relevant documentation on the behalf of Miranda Epley, FNP,as directed by  Miranda Epley, FNP while in the presence of Miranda Epley, FNP.  Subjective:    Patient ID: Miranda Shah , female    DOB: November 20, 1975 , 48 y.o.   MRN: [id number]  No chief complaint on file.   HPI  HPI   Past Medical History:  Diagnosis Date   Anemia    Elevated liver enzymes 08/17/2018   Gestational hypertension    Seborrheic dermatitis      Family History  Problem Relation Age of Onset   Diabetes Mother    Hypertension Mother    Hypertension Father    Hyperlipidemia Maternal Grandmother      Current Outpatient Medications:    Fluocinolone Acetonide Scalp 0.01 % OIL, Apply topically. (Patient not taking: Reported on 09/27/2023), Disp: , Rfl:    mometasone  (NASONEX ) 50 MCG/ACT nasal spray, PLACE 2 SPRAYS INTO THE NOSE DAILY., Disp: 51 each, Rfl: 0   nitrofurantoin , macrocrystal-monohydrate, (MACROBID ) 100 MG capsule, Take 1 capsule (100 mg total) by mouth 2 (two) times daily., Disp: 10 capsule, Rfl: 0   Vitamin D , Ergocalciferol , (DRISDOL ) 1.25 MG (50000 UNIT) CAPS capsule, Take 1 capsule (50,000 Units total) by mouth every 7 (seven) days. (Patient not taking: Reported on 09/27/2023), Disp: 12 capsule, Rfl: 1   ZORYVE 0.3 % FOAM, Apply 1 Application topically daily at 6 (six) AM., Disp: , Rfl:    Allergies  Allergen Reactions   Bee Pollen Itching    Itching, red eyes. sneezing Itching, red eyes. sneezing   Cat Dander Swelling   Dog Epithelium Swelling   Dust Mite Extract Anaphylaxis   Black Walnut Pollen Allergy Skin Test Swelling   Pecan Extract Itching    Pecan and walnuts makes Mouth itch   Pollen Extract Itching    Itching, red eyes. sneezing      The patient states she uses {contraceptive methods:5051} for birth control. No LMP recorded.. {Dysmenorrhea-menorrhagia:21918}. Negative for: breast discharge,  breast lump(s), breast pain and breast self exam. Associated symptoms include abnormal vaginal bleeding. Pertinent negatives include abnormal bleeding (hematology), anxiety, decreased libido, depression, difficulty falling sleep, dyspareunia, history of infertility, nocturia, sexual dysfunction, sleep disturbances, urinary incontinence, urinary urgency, vaginal discharge and vaginal itching. Diet regular.The patient states her exercise level is    . The patient's tobacco use is:  Social History   Tobacco Use  Smoking Status Former   Current packs/day: 0.00   Types: Cigarettes   Quit date: 08/03/2013   Years since quitting: 10.7  Smokeless Tobacco Never  . She has been exposed to passive smoke. The patient's alcohol use is:  Social History   Substance and Sexual Activity  Alcohol Use Not Currently   Comment: occasional  . Additional information: Last pap ***, next one scheduled for ***.    Review of Systems   There were no vitals filed for this visit. There is no height or weight on file to calculate BMI.  Wt Readings from Last 3 Encounters:  09/27/23 206 lb (93.4 kg)  09/25/23 206 lb (93.4 kg)  03/30/23 208 lb 6.4 oz (94.5 kg)     Objective:  Physical Exam      Assessment And Plan:     Encounter for annual health examination  Vitamin D  deficiency  Prediabetes     No follow-ups on file. Patient was given opportunity to ask questions. Patient verbalized understanding  of the plan and was able to repeat key elements of the plan. All questions were answered to their satisfaction.   Miranda Epley, FNP  I, Miranda Epley, FNP, have reviewed all documentation for this visit. The documentation on 04/18/24 for the exam, diagnosis, procedures, and orders are all accurate and complete.

## 2024-05-11 ENCOUNTER — Encounter: Admitting: Physician Assistant

## 2024-05-11 ENCOUNTER — Telehealth: Admitting: Emergency Medicine

## 2024-05-11 DIAGNOSIS — R3989 Other symptoms and signs involving the genitourinary system: Secondary | ICD-10-CM

## 2024-05-11 MED ORDER — CEPHALEXIN 500 MG PO CAPS: 500.0000 mg | ORAL_CAPSULE | Freq: Two times a day (BID) | ORAL | 0 refills | Status: AC

## 2024-05-11 MED ORDER — FLUCONAZOLE 150 MG PO TABS
150.0000 mg | ORAL_TABLET | Freq: Every day | ORAL | 0 refills | Status: AC
Start: 2024-05-11 — End: ?

## 2024-05-11 NOTE — Progress Notes (Signed)
 Already completed an E-visit. Mark erroneous for duplicate  This encounter was created in error - please disregard.

## 2024-05-11 NOTE — Progress Notes (Signed)
 E-Visit for Urinary Problems  We are sorry that you are not feeling well.  Here is how we plan to help!  Based on what you shared with me it looks like you most likely have a simple urinary tract infection.  A UTI (Urinary Tract Infection) is a bacterial infection of the bladder.  Most cases of urinary tract infections are simple to treat but a key part of your care is to encourage you to drink plenty of fluids and watch your symptoms carefully.  I have prescribed Keflex  500 mg twice a day for 7 days.  Your symptoms should gradually improve. Call us  if the burning in your urine worsens, you develop worsening fever, back pain or pelvic pain or if your symptoms do not resolve after completing the antibiotic.  I have also prescribed a medicine for in case you get a yeast infection from the antibiotics.   Urinary tract infections can be prevented by drinking plenty of water to keep your body hydrated.  Also be sure when you wipe, wipe from front to back and don't hold it in!  If possible, empty your bladder every 4 hours.  HOME CARE Drink plenty of fluids Compete the full course of the antibiotics even if the symptoms resolve Remember, when you need to go.go. Holding in your urine can increase the likelihood of getting a UTI! GET HELP RIGHT AWAY IF: You cannot urinate You get a high fever Worsening back pain occurs You see blood in your urine You feel sick to your stomach or throw up You feel like you are going to pass out  MAKE SURE YOU  Understand these instructions. Will watch your condition. Will get help right away if you are not doing well or get worse.   Thank you for choosing an e-visit.  Your e-visit answers were reviewed by a board certified advanced clinical practitioner to complete your personal care plan. Depending upon the condition, your plan could have included both over the counter or prescription medications.  Please review your pharmacy choice. Make sure the  pharmacy is open so you can pick up prescription now. If there is a problem, you may contact your provider through [employer] and have the prescription routed to another pharmacy.  Your safety is important to us . If you have drug allergies check your prescription carefully.   For the next 24 hours you can use MyChart to ask questions about today's visit, request a non-urgent call back, or ask for a work or school excuse. You will get an email in the next two days asking about your experience. I hope that your e-visit has been valuable and will speed your recovery.  I have spent 5 minutes in review of e-visit questionnaire, review and updating patient chart, medical decision making and response to patient.   Jon Belt, PhD, FNP-BC

## 2024-10-16 NOTE — Progress Notes (Signed)
 LILLETTE Kristeen JINNY Gladis, CMA,acting as a neurosurgeon for Gaines Ada, FNP.,have documented all relevant documentation on the behalf of Gaines Ada, FNP,as directed by  Gaines Ada, FNP while in the presence of Gaines Ada, FNP.  Subjective:  Patient ID: Miranda Shah , female    DOB: 09/10/1976 , 48 y.o.   MRN: [id number]  Chief Complaint  Patient presents with   Eye Pain    Patient presents today for bilateral eye pain she reports a lot of mucus in her eyes as well, patient reports she first noticed about a month ago.    Leg Pain    Patient also has right leg pain, she reports that she thinks it sciatica that she has had previously. She reports she thinks its sciatica pain due to weight gain.     Worse right leg pain when lays on her right side and when in bed can affect her sleep.   Eye irritation and allergies for the last month. Feels like she has pricks to her eyes. Watching TV will have the sensation. Mainly at night. She wears glasses with blue tint. Has not seen an eye doctor in 1 year. She has been using an eye wash which helps. Has had more drainage from her eyes.     Discussed the use of AI scribe software for clinical note transcription with the patient, who gave verbal consent to proceed.  History of Present Illness Miranda Shah is a 48 year old female who presents with leg pain and eye irritation.  She experiences a burning, tingling sensation in her right leg, similar to previous sciatica symptoms on her left side in 2019. The pain is localized, non-radiating, and occurs primarily when lying on her right side, occasionally waking her from sleep. She has taken Tylenol  once for relief. The pain has persisted for about a week and a half. No numbness or tingling in her feet or ankles.  She reports eye irritation and pain. The eye pain feels like 'little pricks' and occurs mainly at night, accompanied by itching, a sensation of debris, stringy mucus, and occasional milky discharge. She uses  a [employer] eye wash for relief. She has a history of allergies and is allergic to Pataday  eye drops, which previously increased her blood pressure. Her eyes are not matted in the morning, but she experiences itching and occasional milky discharge.  She has gained weight despite regular Pilates classes and no dietary changes. Her current weight is 222 lbs, up from approximately 209 lbs. She is concerned about the weight gain, as it is similar to her weight before pregnancy, which previously led to sciatica. She is taking [employer] Girl vitamins, including vitamin D , a multivitamin, and hair, skin, and nails supplements.  Past Medical History:  Diagnosis Date   Anemia    Elevated liver enzymes 08/17/2018   Gestational hypertension    Seborrheic dermatitis      Family History  Problem Relation Age of Onset   Diabetes Mother    Hypertension Mother    Hypertension Father    Hyperlipidemia Maternal Grandmother      Current Outpatient Medications:    mometasone  (NASONEX ) 50 MCG/ACT nasal spray, PLACE 2 SPRAYS INTO THE NOSE DAILY., Disp: 51 each, Rfl: 0   ZORYVE 0.3 % FOAM, Apply 1 Application topically daily at 6 (six) AM., Disp: , Rfl:    Vitamin D , Ergocalciferol , (DRISDOL ) 1.25 MG (50000 UNIT) CAPS capsule, Take 1 capsule (50,000 Units total) by mouth every 7 (seven)  days. (Patient not taking: Reported on 10/17/2024), Disp: 12 capsule, Rfl: 1   Allergies[1]   Review of Systems  Constitutional: Negative.   Respiratory: Negative.    Cardiovascular: Negative.   Gastrointestinal: Negative.   Neurological: Negative.   Psychiatric/Behavioral: Negative.       Today's Vitals   10/17/24 1501  BP: 120/70  Pulse: 77  Temp: 99 F (37.2 C)  TempSrc: Oral  Weight: 222 lb (100.7 kg)  Height: 5' 3 (1.6 m)  PainSc: 0-No pain   Body mass index is 39.33 kg/m.  Wt Readings from Last 3 Encounters:  10/17/24 222 lb (100.7 kg)  09/27/23 206 lb (93.4 kg)  09/25/23 206 lb (93.4 kg)       Objective:  Physical Exam Vitals and nursing note reviewed.  Constitutional:      General: She is not in acute distress.    Appearance: Normal appearance. She is obese.  Cardiovascular:     Rate and Rhythm: Normal rate and regular rhythm.     Pulses: Normal pulses.     Heart sounds: Normal heart sounds. No murmur heard. Pulmonary:     Effort: Pulmonary effort is normal. No respiratory distress.     Breath sounds: Normal breath sounds. No wheezing.  Abdominal:     Tenderness: There is no right CVA tenderness or left CVA tenderness.  Skin:    Capillary Refill: Capillary refill takes less than 2 seconds.  Neurological:     General: No focal deficit present.     Mental Status: She is alert and oriented to person, place, and time.     Cranial Nerves: No cranial nerve deficit.     Motor: No weakness.  Psychiatric:        Mood and Affect: Mood normal.        Behavior: Behavior normal.        Thought Content: Thought content normal.        Judgment: Judgment normal.         Assessment And Plan:   Assessment & Plan Pain of both eyes Eye irritation with itching, stringy mucus, and occasional discharge, likely due to environmental allergens. Pataday  previously caused elevated blood pressure. - Recommended Visine triple action eye drops. - Advised follow-up with an eye doctor if symptoms persist or worsen. Right leg pain Intermittent nocturnal burning and tingling pain, improved with cold compresses and positional changes. Differential includes sciatica, possibly exacerbated by weight gain. - Recommended ibuprofen  or acetaminophen  for a few days. - Advised against prolonged ibuprofen  use due to gastrointestinal risks. - Considered steroids if symptoms worsen or persist. Prediabetes Currently taking vitamin D  supplements. - Checked vitamin D  levels. Vitamin D  deficiency Currently taking vitamin D  supplements. - Checked vitamin D  levels. Class 2 obesity due to excess calories  with body mass index (BMI) of 39.0 to 39.9 in adult, unspecified whether serious comorbidity present Weight gain from 209 lbs to 222 lbs despite regular Pilates and no significant dietary changes. Discussed diet and exercise impact on weight management. Insurance does not cover weight loss medications. - Checked thyroid  function and A1c. - Advised dietary modifications to reduce carbohydrates and sweets, increase fruits, vegetables, and proteins. - Encouraged regular exercise, including strength training and cardio. - Discussed potential for weight loss medications contingent on insurance coverage and availability. Influenza vaccination declined  Encounter for screening mammogram for breast cancer   Orders Placed This Encounter  Procedures   MM Digital Screening   Hemoglobin A1c   Vitamin D  (25  hydroxy)   TSH      Return for keep same next.  Patient was given opportunity to ask questions. Patient verbalized understanding of the plan and was able to repeat key elements of the plan. All questions were answered to their satisfaction.   LILLETTE Gaines Ada, FNP, have reviewed all documentation for this visit. The documentation on 10/17/2024 for the exam, diagnosis, procedures, and orders are all accurate and complete.    IF YOU HAVE BEEN REFERRED TO A SPECIALIST, IT MAY TAKE 1-2 WEEKS TO SCHEDULE/PROCESS THE REFERRAL. IF YOU HAVE NOT HEARD FROM US /SPECIALIST IN TWO WEEKS, PLEASE GIVE US  A CALL AT [phone].      [1]  Allergies Allergen Reactions   Bee Pollen Itching    Itching, red eyes. sneezing Itching, red eyes. sneezing   Cat Dander Swelling   Dog Epithelium Swelling   Dust Mite Extract Anaphylaxis   Black Walnut Pollen Allergy Skin Test Swelling   Pecan Extract Itching    Pecan and walnuts makes Mouth itch   Pollen Extract Itching    Itching, red eyes. sneezing

## 2024-10-17 ENCOUNTER — Encounter: Payer: Self-pay | Admitting: Nurse Practitioner

## 2024-10-17 ENCOUNTER — Ambulatory Visit (INDEPENDENT_AMBULATORY_CARE_PROVIDER_SITE_OTHER): Admitting: Nurse Practitioner

## 2024-10-17 VITALS — BP 120/70 | HR 77 | Temp 99.0°F | Ht 63.0 in | Wt 222.0 lb

## 2024-10-17 DIAGNOSIS — Z2821 Immunization not carried out because of patient refusal: Secondary | ICD-10-CM

## 2024-10-17 DIAGNOSIS — M79604 Pain in right leg: Secondary | ICD-10-CM

## 2024-10-17 DIAGNOSIS — R7303 Prediabetes: Secondary | ICD-10-CM | POA: Diagnosis not present

## 2024-10-17 DIAGNOSIS — E559 Vitamin D deficiency, unspecified: Secondary | ICD-10-CM | POA: Diagnosis not present

## 2024-10-17 DIAGNOSIS — E6609 Other obesity due to excess calories: Secondary | ICD-10-CM | POA: Insufficient documentation

## 2024-10-17 DIAGNOSIS — Z6839 Body mass index (BMI) 39.0-39.9, adult: Secondary | ICD-10-CM

## 2024-10-17 DIAGNOSIS — E66812 Obesity, class 2: Secondary | ICD-10-CM

## 2024-10-17 DIAGNOSIS — H5713 Ocular pain, bilateral: Secondary | ICD-10-CM | POA: Diagnosis not present

## 2024-10-17 DIAGNOSIS — Z1231 Encounter for screening mammogram for malignant neoplasm of breast: Secondary | ICD-10-CM

## 2024-10-17 NOTE — Patient Instructions (Signed)
 Contains text generated by [employer].

## 2024-10-18 LAB — VITAMIN D 25 HYDROXY (VIT D DEFICIENCY, FRACTURES): Vit D, 25-Hydroxy: 35.8 ng/mL (ref 30.0–100.0)

## 2024-10-18 LAB — TSH: TSH: 1.88 u[IU]/mL (ref 0.450–4.500)

## 2024-10-18 LAB — HEMOGLOBIN A1C
Est. average glucose Bld gHb Est-mCnc: 123 mg/dL
Hgb A1c MFr Bld: 5.9 % — ABNORMAL HIGH (ref 4.8–5.6)

## 2024-10-28 NOTE — Assessment & Plan Note (Addendum)
 Currently taking vitamin D  supplements. - Checked vitamin D  levels.

## 2024-10-28 NOTE — Assessment & Plan Note (Signed)
 Eye irritation with itching, stringy mucus, and occasional discharge, likely due to environmental allergens. Pataday  previously caused elevated blood pressure. - Recommended Visine triple action eye drops. - Advised follow-up with an eye doctor if symptoms persist or worsen.

## 2024-10-28 NOTE — Assessment & Plan Note (Signed)
 Currently taking vitamin D  supplements. - Checked vitamin D  levels.

## 2024-10-28 NOTE — Assessment & Plan Note (Signed)
 Weight gain from 209 lbs to 222 lbs despite regular Pilates and no significant dietary changes. Discussed diet and exercise impact on weight management. Insurance does not cover weight loss medications. - Checked thyroid  function and A1c. - Advised dietary modifications to reduce carbohydrates and sweets, increase fruits, vegetables, and proteins. - Encouraged regular exercise, including strength training and cardio. - Discussed potential for weight loss medications contingent on insurance coverage and availability.

## 2024-10-28 NOTE — Assessment & Plan Note (Signed)
 Intermittent nocturnal burning and tingling pain, improved with cold compresses and positional changes. Differential includes sciatica, possibly exacerbated by weight gain. - Recommended ibuprofen  or acetaminophen  for a few days. - Advised against prolonged ibuprofen  use due to gastrointestinal risks. - Considered steroids if symptoms worsen or persist.

## 2025-01-10 ENCOUNTER — Encounter: Admitting: Nurse Practitioner
